# Patient Record
Sex: Male | Born: 1950 | Race: White | Hispanic: No | Marital: Single | Smoking: Never smoker
Health system: Southern US, Community
[De-identification: ages and names within clinical notes are randomized; demographics above are authoritative.]

## PROBLEM LIST (undated history)

## (undated) DIAGNOSIS — I2510 Atherosclerotic heart disease of native coronary artery without angina pectoris: Secondary | ICD-10-CM

## (undated) DIAGNOSIS — N390 Urinary tract infection, site not specified: Secondary | ICD-10-CM

## (undated) DIAGNOSIS — L0390 Cellulitis, unspecified: Secondary | ICD-10-CM

## (undated) DIAGNOSIS — I739 Peripheral vascular disease, unspecified: Secondary | ICD-10-CM

## (undated) DIAGNOSIS — K219 Gastro-esophageal reflux disease without esophagitis: Secondary | ICD-10-CM

## (undated) DIAGNOSIS — I509 Heart failure, unspecified: Secondary | ICD-10-CM

## (undated) DIAGNOSIS — E669 Obesity, unspecified: Secondary | ICD-10-CM

## (undated) DIAGNOSIS — D689 Coagulation defect, unspecified: Secondary | ICD-10-CM

## (undated) DIAGNOSIS — L8990 Pressure ulcer of unspecified site, unspecified stage: Secondary | ICD-10-CM

## (undated) DIAGNOSIS — I10 Essential (primary) hypertension: Secondary | ICD-10-CM

## (undated) DIAGNOSIS — J189 Pneumonia, unspecified organism: Secondary | ICD-10-CM

## (undated) DIAGNOSIS — R1311 Dysphagia, oral phase: Secondary | ICD-10-CM

## (undated) DIAGNOSIS — R319 Hematuria, unspecified: Principal | ICD-10-CM

## (undated) DIAGNOSIS — M6281 Muscle weakness (generalized): Secondary | ICD-10-CM

## (undated) DIAGNOSIS — L89159 Pressure ulcer of sacral region, unspecified stage: Secondary | ICD-10-CM

## (undated) DIAGNOSIS — G8220 Paraplegia, unspecified: Secondary | ICD-10-CM

## (undated) DIAGNOSIS — N319 Neuromuscular dysfunction of bladder, unspecified: Secondary | ICD-10-CM

## (undated) DIAGNOSIS — K589 Irritable bowel syndrome without diarrhea: Secondary | ICD-10-CM

## (undated) DIAGNOSIS — F419 Anxiety disorder, unspecified: Secondary | ICD-10-CM

## (undated) DIAGNOSIS — D649 Anemia, unspecified: Secondary | ICD-10-CM

## (undated) DIAGNOSIS — G5600 Carpal tunnel syndrome, unspecified upper limb: Secondary | ICD-10-CM

## (undated) DIAGNOSIS — G4733 Obstructive sleep apnea (adult) (pediatric): Secondary | ICD-10-CM

## (undated) DIAGNOSIS — R569 Unspecified convulsions: Secondary | ICD-10-CM

## (undated) HISTORY — DX: Urinary tract infection, site not specified: N39.0

## (undated) HISTORY — DX: Pressure ulcer of sacral region, unspecified stage: L89.159

## (undated) HISTORY — PX: COLON SURGERY: SHX602

## (undated) HISTORY — DX: Irritable bowel syndrome without diarrhea: K58.9

## (undated) HISTORY — DX: Hematuria, unspecified: R31.9

## (undated) HISTORY — DX: Coagulation defect, unspecified: D68.9

## (undated) HISTORY — DX: Cellulitis, unspecified: L03.90

## (undated) HISTORY — DX: Carpal tunnel syndrome, unspecified upper limb: G56.00

## (undated) HISTORY — DX: Muscle weakness (generalized): M62.81

## (undated) HISTORY — DX: Obesity, unspecified: E66.9

## (undated) HISTORY — PX: OTHER SURGICAL HISTORY: SHX169

## (undated) HISTORY — DX: Anemia, unspecified: D64.9

## (undated) HISTORY — DX: Neuromuscular dysfunction of bladder, unspecified: N31.9

## (undated) HISTORY — DX: Anxiety disorder, unspecified: F41.9

## (undated) HISTORY — DX: Dysphagia, oral phase: R13.11

## (undated) HISTORY — DX: Essential (primary) hypertension: I10

## (undated) HISTORY — DX: Pneumonia, unspecified organism: J18.9

---

## 2005-08-03 ENCOUNTER — Inpatient Hospital Stay (HOSPITAL_COMMUNITY): Admission: EM | Admit: 2005-08-03 | Discharge: 2005-08-07 | Payer: Self-pay | Admitting: Emergency Medicine

## 2005-08-05 ENCOUNTER — Encounter (INDEPENDENT_AMBULATORY_CARE_PROVIDER_SITE_OTHER): Payer: Self-pay | Admitting: *Deleted

## 2006-01-14 ENCOUNTER — Ambulatory Visit (HOSPITAL_COMMUNITY): Admission: RE | Admit: 2006-01-14 | Discharge: 2006-01-14 | Payer: Self-pay | Admitting: Internal Medicine

## 2006-01-20 ENCOUNTER — Ambulatory Visit (HOSPITAL_COMMUNITY): Admission: RE | Admit: 2006-01-20 | Discharge: 2006-01-20 | Payer: Self-pay | Admitting: Internal Medicine

## 2006-05-07 ENCOUNTER — Inpatient Hospital Stay (HOSPITAL_COMMUNITY): Admission: EM | Admit: 2006-05-07 | Discharge: 2006-05-24 | Payer: Self-pay | Admitting: Emergency Medicine

## 2006-05-11 ENCOUNTER — Ambulatory Visit: Payer: Self-pay | Admitting: Internal Medicine

## 2007-06-17 ENCOUNTER — Emergency Department (HOSPITAL_COMMUNITY): Admission: EM | Admit: 2007-06-17 | Discharge: 2007-06-17 | Payer: Self-pay | Admitting: Emergency Medicine

## 2007-07-30 ENCOUNTER — Inpatient Hospital Stay (HOSPITAL_COMMUNITY): Admission: EM | Admit: 2007-07-30 | Discharge: 2007-08-05 | Payer: Self-pay | Admitting: Emergency Medicine

## 2007-07-30 ENCOUNTER — Ambulatory Visit: Payer: Self-pay | Admitting: Pulmonary Disease

## 2008-01-30 ENCOUNTER — Ambulatory Visit (HOSPITAL_COMMUNITY): Admission: RE | Admit: 2008-01-30 | Discharge: 2008-01-30 | Payer: Self-pay | Admitting: Internal Medicine

## 2008-10-08 ENCOUNTER — Inpatient Hospital Stay (HOSPITAL_COMMUNITY): Admission: EM | Admit: 2008-10-08 | Discharge: 2008-10-11 | Payer: Self-pay | Admitting: Emergency Medicine

## 2008-10-09 ENCOUNTER — Encounter (INDEPENDENT_AMBULATORY_CARE_PROVIDER_SITE_OTHER): Payer: Self-pay | Admitting: *Deleted

## 2008-10-10 ENCOUNTER — Ambulatory Visit: Payer: Self-pay | Admitting: Internal Medicine

## 2008-10-14 ENCOUNTER — Emergency Department (HOSPITAL_COMMUNITY): Admission: EM | Admit: 2008-10-14 | Discharge: 2008-10-14 | Payer: Self-pay | Admitting: Emergency Medicine

## 2009-05-21 ENCOUNTER — Ambulatory Visit (HOSPITAL_COMMUNITY): Admission: RE | Admit: 2009-05-21 | Discharge: 2009-05-21 | Payer: Self-pay | Admitting: Gastroenterology

## 2009-05-21 ENCOUNTER — Encounter (INDEPENDENT_AMBULATORY_CARE_PROVIDER_SITE_OTHER): Payer: Self-pay | Admitting: Gastroenterology

## 2009-05-27 ENCOUNTER — Ambulatory Visit (HOSPITAL_COMMUNITY): Admission: RE | Admit: 2009-05-27 | Discharge: 2009-05-27 | Payer: Self-pay | Admitting: Oral Surgery

## 2010-05-03 ENCOUNTER — Inpatient Hospital Stay (HOSPITAL_COMMUNITY): Admission: EM | Admit: 2010-05-03 | Discharge: 2010-05-10 | Payer: Self-pay | Admitting: Emergency Medicine

## 2010-05-17 ENCOUNTER — Emergency Department (HOSPITAL_COMMUNITY): Admission: EM | Admit: 2010-05-17 | Discharge: 2010-05-17 | Payer: Self-pay | Admitting: Emergency Medicine

## 2010-08-04 ENCOUNTER — Emergency Department (HOSPITAL_COMMUNITY)
Admission: EM | Admit: 2010-08-04 | Discharge: 2010-08-04 | Payer: Self-pay | Source: Home / Self Care | Admitting: Emergency Medicine

## 2010-08-29 ENCOUNTER — Encounter: Payer: Self-pay | Admitting: *Deleted

## 2010-08-30 ENCOUNTER — Encounter (HOSPITAL_BASED_OUTPATIENT_CLINIC_OR_DEPARTMENT_OTHER): Payer: Self-pay | Admitting: Internal Medicine

## 2010-10-22 LAB — COMPREHENSIVE METABOLIC PANEL
ALT: 106 U/L — ABNORMAL HIGH (ref 0–53)
ALT: 33 U/L (ref 0–53)
ALT: 60 U/L — ABNORMAL HIGH (ref 0–53)
ALT: 93 U/L — ABNORMAL HIGH (ref 0–53)
AST: 45 U/L — ABNORMAL HIGH (ref 0–37)
AST: 70 U/L — ABNORMAL HIGH (ref 0–37)
Albumin: 2 g/dL — ABNORMAL LOW (ref 3.5–5.2)
Albumin: 2.2 g/dL — ABNORMAL LOW (ref 3.5–5.2)
Alkaline Phosphatase: 222 U/L — ABNORMAL HIGH (ref 39–117)
BUN: 10 mg/dL (ref 6–23)
BUN: 4 mg/dL — ABNORMAL LOW (ref 6–23)
BUN: 5 mg/dL — ABNORMAL LOW (ref 6–23)
BUN: 6 mg/dL (ref 6–23)
BUN: 8 mg/dL (ref 6–23)
CO2: 32 mEq/L (ref 19–32)
CO2: 34 mEq/L — ABNORMAL HIGH (ref 19–32)
CO2: 38 mEq/L — ABNORMAL HIGH (ref 19–32)
Calcium: 8.4 mg/dL (ref 8.4–10.5)
Calcium: 8.4 mg/dL (ref 8.4–10.5)
Calcium: 8.5 mg/dL (ref 8.4–10.5)
Calcium: 8.5 mg/dL (ref 8.4–10.5)
Calcium: 8.7 mg/dL (ref 8.4–10.5)
Chloride: 101 mEq/L (ref 96–112)
Chloride: 98 mEq/L (ref 96–112)
Chloride: 99 mEq/L (ref 96–112)
Creatinine, Ser: 0.68 mg/dL (ref 0.4–1.5)
Creatinine, Ser: 0.69 mg/dL (ref 0.4–1.5)
Creatinine, Ser: 0.72 mg/dL (ref 0.4–1.5)
Creatinine, Ser: 0.75 mg/dL (ref 0.4–1.5)
Creatinine, Ser: 0.76 mg/dL (ref 0.4–1.5)
GFR calc Af Amer: >60 mL/min (ref >60)
GFR calc Af Amer: >60 mL/min (ref >60)
GFR calc non Af Amer: >60 mL/min (ref >60)
GFR calc non Af Amer: >60 mL/min (ref >60)
GFR calc non Af Amer: >60 mL/min (ref >60)
GFR calc non Af Amer: >60 mL/min (ref >60)
Glucose, Bld: 134 mg/dL — ABNORMAL HIGH (ref 70–99)
Glucose, Bld: 94 mg/dL (ref 70–99)
Glucose, Bld: 95 mg/dL (ref 70–99)
Potassium: 4 mEq/L (ref 3.5–5.1)
Sodium: 139 mEq/L (ref 135–145)
Sodium: 141 mEq/L (ref 135–145)
Sodium: 142 mEq/L (ref 135–145)
Sodium: 143 mEq/L (ref 135–145)
Total Bilirubin: 0.3 mg/dL (ref 0.3–1.2)
Total Bilirubin: 0.5 mg/dL (ref 0.3–1.2)
Total Bilirubin: 0.5 mg/dL (ref 0.3–1.2)
Total Bilirubin: 0.5 mg/dL (ref 0.3–1.2)
Total Protein: 7.3 g/dL (ref 6.0–8.3)
Total Protein: 7.4 g/dL (ref 6.0–8.3)
Total Protein: 7.6 g/dL (ref 6.0–8.3)

## 2010-10-22 LAB — URINALYSIS, ROUTINE W REFLEX MICROSCOPIC
Bilirubin Urine: NEGATIVE
Glucose, UA: NEGATIVE mg/dL
Ketones, ur: 15 mg/dL — AB
Nitrite: NEGATIVE
Protein, ur: 100 mg/dL — AB
Specific Gravity, Urine: 1.023 (ref 1.005–1.030)
Urobilinogen, UA: 1 mg/dL (ref 0.0–1.0)
Urobilinogen, UA: 1 mg/dL (ref 0.0–1.0)
pH: 8 (ref 5.0–8.0)

## 2010-10-22 LAB — URINE CULTURE
Colony Count: >=100000
Culture  Setup Time: 201109251844

## 2010-10-22 LAB — CBC
HCT: 27.1 % — ABNORMAL LOW (ref 39.0–52.0)
HCT: 28.2 % — ABNORMAL LOW (ref 39.0–52.0)
HCT: 28.4 % — ABNORMAL LOW (ref 39.0–52.0)
HCT: 28.4 % — ABNORMAL LOW (ref 39.0–52.0)
Hemoglobin: 7.5 g/dL — ABNORMAL LOW (ref 13.0–17.0)
Hemoglobin: 7.8 g/dL — ABNORMAL LOW (ref 13.0–17.0)
Hemoglobin: 8.1 g/dL — ABNORMAL LOW (ref 13.0–17.0)
Hemoglobin: 8.7 g/dL — ABNORMAL LOW (ref 13.0–17.0)
MCH: 22.7 pg — ABNORMAL LOW (ref 26.0–34.0)
MCH: 23 pg — ABNORMAL LOW (ref 26.0–34.0)
MCH: 23.2 pg — ABNORMAL LOW (ref 26.0–34.0)
MCH: 23.3 pg — ABNORMAL LOW (ref 26.0–34.0)
MCH: 23.4 pg — ABNORMAL LOW (ref 26.0–34.0)
MCH: 23.4 pg — ABNORMAL LOW (ref 26.0–34.0)
MCHC: 28.5 g/dL — ABNORMAL LOW (ref 30.0–36.0)
MCHC: 28.9 g/dL — ABNORMAL LOW (ref 30.0–36.0)
MCHC: 28.9 g/dL — ABNORMAL LOW (ref 30.0–36.0)
MCHC: 29.6 g/dL — ABNORMAL LOW (ref 30.0–36.0)
MCHC: 30.3 g/dL (ref 30.0–36.0)
MCV: 75.8 fL — ABNORMAL LOW (ref 78.0–100.0)
MCV: 75.9 fL — ABNORMAL LOW (ref 78.0–100.0)
MCV: 78.1 fL (ref 78.0–100.0)
MCV: 78.2 fL (ref 78.0–100.0)
MCV: 78.5 fL (ref 78.0–100.0)
Platelets: 258 10*3/uL (ref 150–400)
Platelets: 405 10*3/uL — ABNORMAL HIGH (ref 150–400)
Platelets: 533 10*3/uL — ABNORMAL HIGH (ref 150–400)
RBC: 3.3 MIL/uL — ABNORMAL LOW (ref 4.22–5.81)
RBC: 3.31 MIL/uL — ABNORMAL LOW (ref 4.22–5.81)
RBC: 3.45 MIL/uL — ABNORMAL LOW (ref 4.22–5.81)
RBC: 3.52 MIL/uL — ABNORMAL LOW (ref 4.22–5.81)
RBC: 3.53 MIL/uL — ABNORMAL LOW (ref 4.22–5.81)
RBC: 3.72 MIL/uL — ABNORMAL LOW (ref 4.22–5.81)
RDW: 17.7 % — ABNORMAL HIGH (ref 11.5–15.5)
RDW: 17.9 % — ABNORMAL HIGH (ref 11.5–15.5)
RDW: 18 % — ABNORMAL HIGH (ref 11.5–15.5)
WBC: 12.6 10*3/uL — ABNORMAL HIGH (ref 4.0–10.5)
WBC: 21.1 10*3/uL — ABNORMAL HIGH (ref 4.0–10.5)

## 2010-10-22 LAB — CULTURE, BLOOD (ROUTINE X 2)
Culture  Setup Time: 201109251849
Culture: NO GROWTH

## 2010-10-22 LAB — POCT CARDIAC MARKERS
CKMB, poc: <1 ng/mL — ABNORMAL LOW (ref 1.0–8.0)
Myoglobin, poc: 223 ng/mL (ref 12–200)
Troponin i, poc: <0.05 ng/mL (ref 0.00–0.09)

## 2010-10-22 LAB — CROSSMATCH
ABO/RH(D): A NEG
DAT, IgG: NEGATIVE

## 2010-10-22 LAB — DIFFERENTIAL
Basophils Relative: 0 % (ref 0–1)
Eosinophils Absolute: 0 10*3/uL (ref 0.0–0.7)
Eosinophils Absolute: 0.1 10*3/uL (ref 0.0–0.7)
Eosinophils Relative: 0 % (ref 0–5)
Eosinophils Relative: 1 % (ref 0–5)
Lymphocytes Relative: 6 % — ABNORMAL LOW (ref 12–46)
Lymphs Abs: 1.1 10*3/uL (ref 0.7–4.0)
Lymphs Abs: 1.2 10*3/uL (ref 0.7–4.0)
Monocytes Relative: 7 % (ref 3–12)
Monocytes Relative: 7 % (ref 3–12)

## 2010-10-22 LAB — URINE MICROSCOPIC-ADD ON

## 2010-10-22 LAB — VANCOMYCIN, TROUGH: Vancomycin Tr: 23.2 ug/mL — ABNORMAL HIGH (ref 10.0–20.0)

## 2010-10-22 LAB — IRON AND TIBC
Saturation Ratios: 13 % — ABNORMAL LOW (ref 20–55)
TIBC: 168 ug/dL — ABNORMAL LOW (ref 215–435)

## 2010-10-22 LAB — BASIC METABOLIC PANEL
CO2: 30 mEq/L (ref 19–32)
CO2: 33 mEq/L — ABNORMAL HIGH (ref 19–32)
Calcium: 8.2 mg/dL — ABNORMAL LOW (ref 8.4–10.5)
Chloride: 95 mEq/L — ABNORMAL LOW (ref 96–112)
Chloride: 95 mEq/L — ABNORMAL LOW (ref 96–112)
Creatinine, Ser: 0.89 mg/dL (ref 0.4–1.5)
GFR calc Af Amer: >60 mL/min (ref >60)
GFR calc Af Amer: >60 mL/min (ref >60)
Glucose, Bld: 116 mg/dL — ABNORMAL HIGH (ref 70–99)
Potassium: 3.8 mEq/L (ref 3.5–5.1)
Sodium: 133 mEq/L — ABNORMAL LOW (ref 135–145)

## 2010-10-22 LAB — FOLATE: Folate: >20 ng/mL

## 2010-10-22 LAB — VALPROIC ACID LEVEL: Valproic Acid Lvl: 11.8 ug/mL — ABNORMAL LOW (ref 50.0–100.0)

## 2010-11-12 LAB — CBC
Platelets: 263 10*3/uL (ref 150–400)
RBC: 4.62 MIL/uL (ref 4.22–5.81)
WBC: 6.9 10*3/uL (ref 4.0–10.5)

## 2010-11-12 LAB — GLUCOSE, CAPILLARY: Glucose-Capillary: 97 mg/dL (ref 70–99)

## 2010-11-12 LAB — BASIC METABOLIC PANEL
Calcium: 9.7 mg/dL (ref 8.4–10.5)
Creatinine, Ser: 0.62 mg/dL (ref 0.4–1.5)
GFR calc Af Amer: >60 mL/min (ref >60)
GFR calc non Af Amer: >60 mL/min (ref >60)

## 2010-11-19 LAB — BASIC METABOLIC PANEL
BUN: 103 mg/dL — ABNORMAL HIGH (ref 6–23)
BUN: 58 mg/dL — ABNORMAL HIGH (ref 6–23)
CO2: 20 mEq/L (ref 19–32)
CO2: 22 mEq/L (ref 19–32)
Calcium: 8.4 mg/dL (ref 8.4–10.5)
Calcium: 8.4 mg/dL (ref 8.4–10.5)
Chloride: 103 mEq/L (ref 96–112)
Creatinine, Ser: 1.97 mg/dL — ABNORMAL HIGH (ref 0.4–1.5)
GFR calc Af Amer: 42 mL/min — ABNORMAL LOW (ref >60)
GFR calc non Af Amer: 35 mL/min — ABNORMAL LOW (ref >60)
GFR calc non Af Amer: >60 mL/min (ref >60)
Glucose, Bld: 111 mg/dL — ABNORMAL HIGH (ref 70–99)
Glucose, Bld: 97 mg/dL (ref 70–99)
Potassium: 4.5 mEq/L (ref 3.5–5.1)
Potassium: 4.6 mEq/L (ref 3.5–5.1)
Sodium: 131 mEq/L — ABNORMAL LOW (ref 135–145)

## 2010-11-19 LAB — POCT I-STAT, CHEM 8
Creatinine, Ser: 3.2 mg/dL — ABNORMAL HIGH (ref 0.4–1.5)
Glucose, Bld: 112 mg/dL — ABNORMAL HIGH (ref 70–99)
HCT: 35 % — ABNORMAL LOW (ref 39.0–52.0)
Hemoglobin: 11.9 g/dL — ABNORMAL LOW (ref 13.0–17.0)
Potassium: 4.8 mEq/L (ref 3.5–5.1)
TCO2: 20 mmol/L (ref 0–100)

## 2010-11-19 LAB — BLOOD GAS, ARTERIAL
Acid-base deficit: 7.3 mmol/L — ABNORMAL HIGH (ref 0.0–2.0)
Bicarbonate: 19.3 mEq/L — ABNORMAL LOW (ref 20.0–24.0)
Drawn by: 232811
FIO2: 0.21 %
O2 Saturation: 91.4 %
Patient temperature: 98.6
TCO2: 18.5 mmol/L (ref 0–100)
pCO2 arterial: 46.3 mmHg — ABNORMAL HIGH (ref 35.0–45.0)
pH, Arterial: 7.244 — ABNORMAL LOW (ref 7.350–7.450)
pO2, Arterial: 67.8 mmHg — ABNORMAL LOW (ref 80.0–100.0)

## 2010-11-19 LAB — CBC
HCT: 30.7 % — ABNORMAL LOW (ref 39.0–52.0)
HCT: 33.4 % — ABNORMAL LOW (ref 39.0–52.0)
HCT: 34.4 % — ABNORMAL LOW (ref 39.0–52.0)
Hemoglobin: 10 g/dL — ABNORMAL LOW (ref 13.0–17.0)
Hemoglobin: 10.7 g/dL — ABNORMAL LOW (ref 13.0–17.0)
MCHC: 32 g/dL (ref 30.0–36.0)
MCHC: 32.7 g/dL (ref 30.0–36.0)
MCV: 77.3 fL — ABNORMAL LOW (ref 78.0–100.0)
MCV: 78.1 fL (ref 78.0–100.0)
MCV: 78.7 fL (ref 78.0–100.0)
Platelets: 206 10*3/uL (ref 150–400)
Platelets: 216 10*3/uL (ref 150–400)
Platelets: 288 10*3/uL (ref 150–400)
RBC: 4.24 MIL/uL (ref 4.22–5.81)
RBC: 4.24 MIL/uL (ref 4.22–5.81)
RBC: 4.45 MIL/uL (ref 4.22–5.81)
RDW: 17.9 % — ABNORMAL HIGH (ref 11.5–15.5)
RDW: 18.2 % — ABNORMAL HIGH (ref 11.5–15.5)
WBC: 6.6 10*3/uL (ref 4.0–10.5)
WBC: 7.4 10*3/uL (ref 4.0–10.5)
WBC: 8.3 10*3/uL (ref 4.0–10.5)

## 2010-11-19 LAB — COMPREHENSIVE METABOLIC PANEL
ALT: 17 U/L (ref 0–53)
AST: 15 U/L (ref 0–37)
Albumin: 3.5 g/dL (ref 3.5–5.2)
Alkaline Phosphatase: 54 U/L (ref 39–117)
CO2: 31 mEq/L (ref 19–32)
Chloride: 100 mEq/L (ref 96–112)
Creatinine, Ser: 0.63 mg/dL (ref 0.4–1.5)
GFR calc Af Amer: >60 mL/min (ref >60)
GFR calc non Af Amer: >60 mL/min (ref >60)
Potassium: 4.7 mEq/L (ref 3.5–5.1)
Sodium: 138 mEq/L (ref 135–145)
Total Bilirubin: 0.7 mg/dL (ref 0.3–1.2)

## 2010-11-19 LAB — URINALYSIS, ROUTINE W REFLEX MICROSCOPIC
Bilirubin Urine: NEGATIVE
Bilirubin Urine: NEGATIVE
Glucose, UA: NEGATIVE mg/dL
Ketones, ur: NEGATIVE mg/dL
Ketones, ur: NEGATIVE mg/dL
Nitrite: POSITIVE — AB
Nitrite: POSITIVE — AB
Specific Gravity, Urine: 1.015 (ref 1.005–1.030)
Specific Gravity, Urine: 1.022 (ref 1.005–1.030)
Urobilinogen, UA: 0.2 mg/dL (ref 0.0–1.0)
pH: 8 (ref 5.0–8.0)

## 2010-11-19 LAB — LIPASE, BLOOD: Lipase: 41 U/L (ref 11–59)

## 2010-11-19 LAB — URINE MICROSCOPIC-ADD ON

## 2010-11-19 LAB — CLOSTRIDIUM DIFFICILE EIA
C difficile Toxins A+B, EIA: NEGATIVE
C difficile Toxins A+B, EIA: NEGATIVE

## 2010-11-19 LAB — CREATININE, URINE, RANDOM: Creatinine, Urine: 132.1 mg/dL

## 2010-11-19 LAB — URINE CULTURE
Colony Count: >=100000
Special Requests: NEGATIVE

## 2010-11-19 LAB — DIFFERENTIAL
Basophils Absolute: 0.3 10*3/uL — ABNORMAL HIGH (ref 0.0–0.1)
Basophils Relative: 1 % (ref 0–1)
Basophils Relative: 4 % — ABNORMAL HIGH (ref 0–1)
Eosinophils Absolute: 0.3 10*3/uL (ref 0.0–0.7)
Eosinophils Relative: 2 % (ref 0–5)
Eosinophils Relative: 4 % (ref 0–5)
Lymphocytes Relative: 15 % (ref 12–46)
Lymphs Abs: 1.1 10*3/uL (ref 0.7–4.0)
Monocytes Absolute: 0.5 10*3/uL (ref 0.1–1.0)
Monocytes Absolute: 0.7 10*3/uL (ref 0.1–1.0)
Monocytes Relative: 10 % (ref 3–12)

## 2010-11-19 LAB — NA AND K (SODIUM & POTASSIUM), RAND UR
Potassium Urine: 24 mEq/L
Sodium, Ur: 39 mEq/L

## 2010-11-19 LAB — ANTISTREPTOLYSIN O TITER: ASO: 153 IU/mL — ABNORMAL HIGH (ref 0–116)

## 2010-11-19 LAB — PROTEIN, URINE, RANDOM: Total Protein, Urine: 67 mg/dL

## 2010-11-19 LAB — HEMOCCULT GUIAC POC 1CARD (OFFICE): Fecal Occult Bld: POSITIVE

## 2010-12-22 NOTE — Consult Note (Signed)
NAMEJERARDO, COSTABILE              ACCOUNT NO.:  [id number]   MEDICAL RECORD NO.:  [id number]          PATIENT TYPE:  INP   LOCATION:  1341                         FACILITY:  [facility]   PHYSICIAN:  Sigmund I. Patsi Sears, M.D.DATE OF BIRTH:  July 26, 1951   DATE OF CONSULTATION:  10/09/2008  DATE OF DISCHARGE:                                 CONSULTATION   CHIEF COMPLAINT:  SP tube is leaking.  Request SP tube change.   HISTORY OF PRESENT ILLNESS:  Acute renal failure.  [name] is a 60-  year-old white male with a history of paraplegia secondary to an  accident in March of 1974 who presents from [facility] skilled nursing  facility after findings of acute renal failure.  The patient was  admitted, and today [facility] has asked [place] to change his SP tube due to  leakage.  The patient has a long history of SP tube placement.  He was  last seen in our office by Dr. Boston Service in 2008.  At that time,  the SP tube was changed in our office, but at this point, the SP tube is  chronically changed at the skilled nursing facility in which he lives.   REVIEW OF SYSTEMS:  Noncontributory.   PAST MEDICAL HISTORY:  1. T7-T8 paraplegia.  2. Chronic pain syndrome.  3. Neurogenic bladder.  4. Recurrent urinary tract infections.  5. Decubitus ulcers.  6. Seizure disorder.  7. Anxiety, depression.  8. History of E-coli septicemia with septic shock in 2008.  9. History of normocytic anemia.  10.History of MRSA.  11.History of sacral decubitus ulcer treated by plastic surgery at      [facility].   ALLERGIES:  1. CIPRO.  2. MINOCYCLINE.   MEDICATIONS ON ADMISSION:  1. Zoloft 150 mg p.o. daily.  2. Vitamin C 500 mg b.i.d.  3. Baclofen 10 mg b.i.d.  4. Keppra 1000 mg b.i.d.  5. Depakote 250 q.h.s.  6. Ambien 10 mg q.h.s.  7. Visine tears p.r.n.  8. Maalox p.r.n.  9. Norco 10/325 p.r.n.  10.Ditropan 5 mg p.o. b.i.d.  11.Flagyl 500 mg p.o. t.i.d. x10 days.  That was started  on October 05, 2008.   FAMILY HISTORY:  Mother is deceased at age 35 with an MI.  Father  deceased at age 68 with an MI.   SOCIAL HISTORY:  The patient currently lives at [facility] nursing  facility.  Denies tobacco, alcohol or drug use.   PHYSICAL EXAMINATION:  VITAL SIGNS TODAY:  Temperature 97.3, pulse 72,  respirations 20, blood pressure 113/53, room air saturation is 98% on 2  liters.   LABORATORY DATA:  Clostridium difficile is negative.  BMET is sodium  131, potassium 4.5, chloride 103, CO2 is 20, glucose 111, BUN is 103 and  creatinine is 1.97.  WBC is 7.4, hemoglobin 10.7, hematocrit 33.4 and  platelets of 216.  CT abdomen and pelvis without contrast shows no renal  pathology evident on the right, possible swelling of the inferior aspect  of the left kidney and prominence of the left renal collecting system  and ureters.  No obvious stone is seen.  It could possibly be related to  sequelae of infection, enlarged xiphoid region mass, the possibility of  a cartilaginous tumor, either benign or malignant, and pelvis shows left  ureters mildly dilated to the bladder.  Suprapubic bladder catheter  remains in place.  Possibly there could be some relative obstruction of  the left ureter because of chronic bladder thickening and inflammation.  Chronic bony changes consistent with previous infection, multiple  decubiti ulcers.  No sign of drainageable abscess in the pelvis.  CT in  2009 of the pelvis shows a suprapubic catheter is in place in the  bladder.  No free fluid, no abscess or lesion.   PHYSICAL EXAMINATION:  GENERAL:  A well-developed, well-nourished white  male in no acute distress.  HEENT:  Normocephalic, atraumatic.  NECK:  Supple, nontender, no masses.  HEART:  Regular rhythm and rate.  LUNGS:  Clear to percussion and auscultation.  ABDOMEN:  Soft, rotund.  Positive bowel sounds x4.  He has a colostomy  in the left lower quadrant with a patent stoma.  SP tube  is in midline  suprapubic.  It is well healed, unremarkable.  There is some urinary  leakage around the site, and he is draining clear yellow urine.  SKIN:  The skin to abdomen is unremarkable.  EXTREMITIES:  Bilateral foot contractures, and feet are approximated on  pillows with +2 pedal pulses.  PSYCHOLOGIC:  Pleasant and cooperative.   ASSESSMENT:  Chronic SP tube related to neurogenic bladder.  Catheter  was changed.  A #22-French catheter was removed without difficulty and  another 22-French catheter attached to bedside drainage bag was inserted  without difficulty.  The patient tolerated the procedure well.  Will  recommend the patient be restarted on his oxybutynin 5 mg one p.o.  t.i.d. for leakage and needs to have SP tube changed again in 30 days at  the skilled nursing facility.   At this time, urology will sign off.  If our further assistance is  needed in the future, please do not hesitate to call.      Jetta Lout, NP      Sigmund I. Patsi Sears, M.D.  Electronically Signed    DW/MEDQ  D:  10/09/2008  T:  10/09/2008  Job:  [id number]

## 2010-12-22 NOTE — Discharge Summary (Signed)
NAMEMARQUAVIOUS, [name]              ACCOUNT NO.:  [id number]   MEDICAL RECORD NO.:  [id number]          PATIENT TYPE:  INP   LOCATION:  1341                         FACILITY:  [facility]   PHYSICIAN:  Monte Fantasia, MD  DATE OF BIRTH:  1950-08-23   DATE OF ADMISSION:  10/08/2008  DATE OF DISCHARGE:                               DISCHARGE SUMMARY   DISCHARGE DIAGNOSES:  1. Urinary tract infection.  2. Acute renal failure.  3. Clostridium difficile infection with Hemoccult blood in stools.  4. Anemia.  5. Chronic pain.  6. Seizure disorder.  7. Neurogenic bladder.  8. History of equalized septicemia shock.  9. History of normocytic anemia.  10.History of methicillin-resistant Staphylococcus aureus wound      infections.  11.History of sacral decubitus ulcers.   DISCHARGE MEDICATIONS:  1. Ascorbic acid 500 mg p.o. b.i.d.  2. Aspirin 81 mg p.o. daily.  3. Baclofen 10 mg p.o. b.i.d.  4. Levaquin 500 mg p.o. daily for 7 days.  5. Depakote 250 mg p.o. nightly.  6. Keppra 1000 mg p.o. b.i.d.  7. Metronidazole 500 mg p.o. t.i.d. for 8 days.  8. Oxybutynin 5 mg p.o. t.i.d.  9. Prevacid 20 mg p.o. daily.  10.Zoloft 150 mg p.o. daily.  11.Ambien 10 mg p.o. nightly p.r.n. insomnia.  12.Visine teardrops as needed.  13.Norco 10/325 mg 1 tablet q.4 h p.r.n. pain.   COURSE DURING THE HOSPITAL STAY:  [name] is a 60 year old  gentleman, with a history of paraplegia secondary to an accident in  March 1974, sent in from [facility] skilled nursing facility with  findings of acute renal failure.  The patient had a creatinine of 3.9  along with sodium of 131 and potassium of 5.7 on admission.  The patient  was given aggressive IV fluid hydration for the same.  The patient also  had a CT of the abdomen and pelvis without contrast which demonstrated  no evidence of renal stone and possible swelling of the anterior aspect  of the left kidney with prominence of left renal collecting system  and  ureter.  The patient had leaking of his suprapubic catheter at the time  of admission.  Hence, the patient was evaluated with Urology and had a  suprapubic catheter changed and was started on the oxybutynin.  The  suprapubic catheter leak was controlled well.  In view of his renal  insufficiency with IV fluid hydration, the patient responded well with  his hydration and creatinine at present has come down to 0.9 from 3.2.   The patient was also evaluated by GI in view of Hemoccult blood positive  in the colostomy bag, from the stools from the colostomy bag.  As per  the GI recommendations, it is not unusual to have guaiac-positive stools  from the colostomy due to the stomal irritation from changing of that  appliance.  They recommended to treat the patient for C. diff and can  have an EGD colon as an outpatient for the same.  Patient was started on  metronidazole for the C. diff prior to admission from [place] on  February 27th,  which was continued through the stay in the hospital.  It  is recommended to complete a course of total of 14 days for control of  the C. diff infection and later can be discontinued, to complete a  course of 14 days.  The patient at present is medically stable to be  discharged and can be discharged to the skilled nursing facility.   RADIOLOGICAL INVESTIGATIONS DONE DURING THE STAY IN THE HOSPITAL:  A CT  scan of the abdomen and pelvis done without contrast on October 08, 2008.  Impression:  No renal pathology evident on the right, possible swelling  of the inferior aspect of the left kidney and prominence of the left  renal collecting system and the ureter.  I do not see a stone, could be  possible sequelae of an infection.  CT of the pelvis.  Impression:  Left  ureter mildly dilated to the bladder.  Suprapubic catheter remains in  place.  Could be possible secondary to obstruction of the left ureter  because of chronic bladder thickening and  inflammation.   LABS DONE DURING THE STAY IN THE HOSPITAL:  Total WBC 5.2, hemoglobin  10.0, hematocrit 30.7, platelets of 206, sodium 139, potassium 4.6,  chloride 111, bicarbonate 22, glucose 97.  BUN 58, creatinine 0.92,  calcium of 8.4.  UA was positive with leukocytes large, nitrate positive  and WBC 21-50.  Urine cultures more than 100,000 colonies of multiple  bacterial morphocytes, none predominant.  Occult blood test x2 are  positive.  C. diff  times 2 sets are negative and 1 is pending.   DISPOSITION:  Patient is stable to be discharged to the skilled nursing  facility.  The patient is recommended to complete a course of  antibiotics, Levaquin for the UTI for a total of 10 days and to follow  the C. diff colitis for a total of 14 days.  I recommended to follow up  with the physician at the nursing care facility.      Monte Fantasia, MD  Electronically Signed     MP/MEDQ  D:  10/11/2008  T:  10/11/2008  Job:  [id number]

## 2010-12-22 NOTE — Discharge Summary (Signed)
[name], [name]              ACCOUNT NO.:  [id number]   MEDICAL RECORD NO.:  [id number]          PATIENT TYPE:  INP   LOCATION:  6715                         FACILITY:  [facility]   PHYSICIAN:  Hillery Aldo, M.D.   DATE OF BIRTH:  07-18-1951   DATE OF ADMISSION:  07/30/2007  DATE OF DISCHARGE:  08/05/2007                               DISCHARGE SUMMARY   DISPOSITION:  Discharged on August 05, 2007, pending bed availability  at [place].   PRIMARY CARE PHYSICIAN:  Dr. Baltazar Najjar with [facility].   DISCHARGE DIAGNOSES:  1. Escherichia coli septicemia with septic shock.  2. Normocytic anemia, status post 2 units of packed red blood cells.  3. Methicillin-resistant Staphylococcus aureus wound infections.  4. Paraplegia.  5. Sacral decubitus ulcer, stage II.  6. Left foot fifth toe ulcer, stage I.  7. Right lower extremity posterior aspect pressure ulcer, stage II.  8. Seizure disorder.  9. Chronic pain.  10.Neurogenic bladder.  11.Anxiety.  12.Depression.   DISCHARGE MEDICATIONS:  1. Keppra 1000 mg p.o. b.i.d.  2. Depakote 250 mg p.o. b.i.d.  3. Multivitamin daily.  4. Omeprazole 20 mg p.o. daily.  5. Aspirin 81 mg p.o. daily.  6. Claritin 10 mg daily.  7. Zoloft 150 mg p.o. daily.  8. Vitamin C 500 mg p.o. b.i.d.  9. Baclofen 10 mg p.o. b.i.d.  10.Rocephin 2 g IV daily through August 12, 2007.  11.Vancomycin 1250 mg IV q.12 h. through August 07, 2007.  12.Seroquel 75 mg nightly.  13.Patanol eye drops 1 drop in each eye b.i.d. p.r.n.  14.Zinc sulfate 220 mg b.i.d.  15.Artificial feels tears 1 drop in each eye q.4 h. p.r.n.   CONSULTATIONS:  Wound care RN.   HISTORY OF PRESENT ILLNESS:  The patient is a 60 year old, paraplegic  male who presented to the emergency department with complaints of severe  abdominal pain and fever.  He was admitted for further evaluation and  workup.  For the full details, please see dictated H&P done by Dr.   Lovell Sheehan.   PROCEDURES/DIAGNOSTIC STUDIES:  1. Chest x-ray on July 30, 2007, showed mild left base      atelectasis.  2. Chest x-ray on July 30, 2007, status post placement of right      internal jugular catheter and showed the catheter in good position      with no other changes.  3. Chest x-ray on July 31, 2007, showed no marked change with      pulmonary vascular congestion.   DISCHARGE LABORATORY VALUES:  Sodium was 142, potassium 3.9, chloride  105, bicarb 31, BUN 5, creatinine 0.68, glucose 98.  White blood cell  count was 6.3, hemoglobin 9.2, hematocrit 27.8, platelets 181.   HOSPITAL COURSE:  Problem 1.  ESCHERICHIA COLI SEPTICEMIA WITH SEPTIC  SHOCK:  The patient was admitted for septic evaluation given his history  of recurrent urinary tract infection.  He was hypotensive and septic and  was transferred to the ICU under the service of the pulmonary critical  care team for the first several days of his hospitalization.  He was  critically ill with circulatory failure, renal failure, hematologic and  severe metabolic derangements due to overwhelming infection.  Blood  cultures subsequently were positive x2 for Escherichia coli.  The  patient did need to be put transiently on Levophed for his septic shock.  His blood pressures subsequently began to stabilize and he was off  pressors by July 31, 2007.  His clinical condition markedly improved  over the next 24 hours and he was able to be transferred to a medical  floor.  His antibiotic coverage was narrowed from [place], which he was  initially put on, to Rocephin once the sensitivities were confirmed on  the blood cultures.  He was also kept on vancomycin for concerns of MRSA  wound infection.  At this point, the patient is hemodynamically stable  and has completed 5 days of antibiotic therapy, concurrently ceftriaxone  and vancomycin.  He will complete a 2-week course of treatment with  ceftriaxone for his  bacteremia and 7 days of treatment with vancomycin  for his MRSA wound infections.  He is to have a peripherally inserted  central catheter placed and once this is done, he will be transferred to  his skilled nursing home at [place].   Problem 2.  NORMOCYTIC ANEMIA:  The patient's anemia is normocytic,  though bordering on the microcytic range.  He had an anemia panel done  which showed an iron of less than 10.  Total iron binding capacity could  not be calculated with normal B12 and folic acid studies.  His ferritin  was 61.  He was put on InFeD in the hospital and also given p.o. iron  supplements which were placed on hold due to abdominal discomfort.  He  subsequently underwent transfusion with 2 units of packed red blood  cells for a hemoglobin of 7.8.  The patient has not had a GI evaluation,  consideration can be made towards doing this once he is stable and off  antibiotic therapy.   Problem 3.  DECUBITUS ULCERS WITH METHICILLIN-RESISTANT STAPHYLOCOCCUS  AUREUS WOUND INFECTION/COLONIZATION:  The patient does have significant  skin breakdown and is at high risk for more breakdown due to his  paraplegia.  He was seen in consultation with the wound care RN.  He has  a large sacral ulcer measuring 10 x 3 x 2-cm with undermining of 1-cm  circumferentially.  He also had partial thickness wounds to his right  and left buttocks thought to be secondary to moisture measuring 3 x 2-  cm.  His right lower extremity posterior aspect had a stage II pressure  ulcer measuring 5 x 3-cm.  He had a left foot fifth toe ulcer measuring  1 x 1-cm, stage I.  We recommend ongoing pressure reduction with a  KinAir bed and trapeze bar.  He should continue to receive normal saline  wet-to-dry dressing changes to his sacral wound three times daily.  He  should have border to his right and left buttock if wounds increased.  He should have an OpSite on his right lower extremity and changed as  needed.  He  was put on vitamin C and zinc supplements to help with wound  healing.   Problem 4.  SEIZURE DISORDER:  The patient has remained seizure-free on  his usual medications.   Problem 5.  CHRONIC PAIN:  The patient was put on baclofen for help with  spasms.  He was given Dilaudid as needed for pain control.   DISPOSITION:  The patient will  be discharged to a skilled nursing  facility once a PICC line is placed.  He should receive a complete  course of IV antibiotics as outlined above.  He should follow up with  his primary care physician early next week.      Hillery Aldo, M.D.  Electronically Signed     CR/MEDQ  D:  08/05/2007  T:  08/05/2007  Job:  [id number]   cc:   Maxwell Caul, M.D.

## 2010-12-22 NOTE — H&P (Signed)
NAMEMANLEY, FASON NO.:  [id number]   MEDICAL RECORD NO.:  [id number]          PATIENT TYPE:  EMS   LOCATION:  ED                           FACILITY[facility]   PHYSICIAN:  Sarajane Marek, MD     DATE OF BIRTH:  Aug 10, 1950   DATE OF ADMISSION:  10/08/2008  DATE OF DISCHARGE:                              HISTORY & PHYSICAL   CHIEF COMPLAINT:  Renal failure.   HISTORY OF PRESENT ILLNESS:  [name] is a 60 year old gentleman with  a history of paraplegia secondary to an accident in March 1974 who  presents from the [facility] after findings of  acute renal failure.  Current laboratory data sent with the patient, he  had BUN 119 and creatinine 3.9 along with a sodium of 131 and potassium  5.7 documented at their facility from labs drawn on October 08, 2008 at 6  a.m.  He was, therefore, sent to the Emergency Department where he did  have abdominal and pelvic CT scan without contrast, which demonstrated  no evidence of renal stone but did demonstrate possible swelling of the  anterior aspect of the left kidney and prominence of the left renal  collecting system and ureter, which could be secondary to infection with  dilation of the left ureter to the level of the bladder.  The patient  reports he has felt well recently, though had what sounds like a viral  illness 2 weeks ago with some sore throat as well as nausea and  vomiting. This has since resolved.  He does endorse decreased p.o.  intake since the time of this illness.  Otherwise, he denies any fevers,  chills or sweats.  He has not had increased ostomy output nor change in  the appearance of ostomy output and specifically denies blood or mucus,  though I would note that the nursing home notes indicate that he was  Hemoccult positive x3 on October 04, 2008 with suspected C. diff  colitis and was initiated on Flagyl therapy to be completed on October 14, 2008.  The patient does endorse  bilateral flank pain, right greater than  left but, otherwise, has no complaints today.   REVIEW OF SYSTEMS:  A complete review of systems was negative except as  noted in the HPI.   PAST MEDICAL HISTORY:  1. T7-T8 paraplegia fall in March 1974.  2. Chronic pain syndrome.  3. Neurogenic bladder.  4. History of recurrent urinary tract infections.  5. Current decubitus ulcers.  6. Seizure disorder.  7. Anxiety and depression.  8. History of E. coli septicemia and septic shock in December 2008.  9. History of normocytic anemia.  10.History of MRSA wound infections.  11.History of sacral decubitus ulcer previously treated by plastic      surgery at [facility], which is now resolved.   ALLERGIES:  CIPRO AND MINOCYCLINE.   MEDICATIONS:  1. Zoloft 150 mg p.o. daily.  2. Vitamin C 500 mg p.o. b.i.d.  3. Baclofen 10 mg p.o. b.i.d.  4. Keppra 1000 mg p.o. b.i.d.  5. Depakote 250 mg  p.o. q.h.s.  6. Ambien 10 mg p.o. q.h.s.  7. Visine tears t.i.d. as needed.  8. Maalox 30 mL p.o. q.4 h p.r.n.  9. Norco 10/325 one tab p.o. q.4 h p.r.n.  10.Ditropan 5 mg p.o. b.i.d.  11.Flagyl 500 mg p.o. t.i.d. x10 days started on 10/05/2008.   FAMILY HISTORY:  The patient reports that his mother is deceased at age  80 with an MI and his father is deceased at age 57 with MI.   SOCIAL HISTORY:  The patient currently resides at the [facility].  He denies history of tobacco use, alcohol or drug use.   PHYSICAL EXAMINATION:  VITAL SIGNS:  Blood pressure 122/61, heart rate  80, respiratory rate 18, pulse ox 96% on room air, temperature 97.7.  GENERAL:  This is a obese, Caucasian male in no acute distress.  HEENT:  Eyes:  Extraocular muscles intact.  Sclerae clear.  Oropharynx:  Mucous membranes are dry but oropharynx is otherwise, clear with no  erythema or exudates.  NECK:  Supple without lymphadenopathy.  There is no thyromegaly, bruit  or JVD.  CARDIOVASCULAR:  Heart  rate is regular, normal S1 and S2.  No S3 or S4.  No audible murmurs, rubs or gallop.  RESPIRATORY:  Lungs are clear bilaterally with mild inspiratory crackles  at the bases, which clear with coughing and likely represent  atelectasis.  The patient had increased work of breathing.  ABDOMEN:  Has a left lower quadrant ostomy with green ostomy output.  No  evidence of gross blood or mucus.  Abdomen is otherwise soft and  nontender, obese.  Exam is otherwise limited by body habitus.  There is  no rebound or guarding.  MUSCULOSKELETAL:  The patient does have bilateral CVA tenderness.  There  is a well healed tracheostomy scar.  EXTREMITIES:  Bilateral lower extremities are wrapped.  There is no  lower extremity edema.  NEUROLOGIC:  The patient is paraplegic but otherwise has no gross neuro  deficits.  He is alert and oriented x3.  GU: suprapubic catheter is in place draining clear yellow urine   LABORATORY DATA:  Urinalysis with a specific gravity of 1.015, pH 8.  There is moderate blood, positive nitrate, large leukocytes.  Negative  ketones.  Microscopy done reads 21-50 white blood cells, 7-10 red blood  cells and many bacteria.  CBC with a white blood cell count of 6.6,  hemoglobin 11.2, hematocrit 34.4, platelets 227.  Sodium 135, potassium  4.8, chloride 110, bicarb 20, BUN 126, creatinine 3.2.   ASSESSMENT/PLAN:  [name] is a 60 year old gentleman with long  history of T7, T8 paraplegia who currently resides at a skilled nursing  facility and presents in acute renal failure.  1. Acute renal failure:  Will consider that this may be prerenal given      decreased p.o. intake and recent viral illness with nausea,      vomiting.  Also with reported history of recent C. diff.  Also      consider this may be obstructive with dilated left ureteral stone      or post-infectious.  We will check a urine culture, ASO antibody      titer, urine electrolytes, urine creatinine, urine protein,  UPC,      urine eosinophils.  Will consult urology in the morning regarding      left ureteral obstruction.  Will also check an ABG given a bicarb      of 20 on I-STAT  BMP.  Will go ahead and start IV fluids and follow      daily electrolytes and creatinine.  I am going to hold his      oxybutynin. Initiate antibiotic therapy with rocephin.  2. Positive Hemoccult and question of C. diff:  Will continue Flagyl,      which was initiated at the nursing home to be continued until October 14, 2008.  Will go ahead and send a C. diff toxin as well as repeat      Hemoccult.  3. Anemia:  This is mild and improved since his prior visit at this      hospital, at which time his hemoglobin was 9.  4. Chronic pain.  Will continue current medications.  5. Seizure disorder, apparently well controlled.  Will continue home      Depakote and Keppra.  6. Prophylaxis:  The patient will be placed on subcutaneous heparin as      well as p.o. Protonix for gastroesophageal reflux disease and deep      vein thrombosis prophylaxis.  7. The patient is FULL CODE.  We will go ahead and consult case      management as well as PT therapy in anticipation of return to      nursing facility.      Sarajane Marek, MD  Electronically Signed     HH/MEDQ  D:  10/08/2008  T:  10/09/2008  Job:  [id number]

## 2010-12-22 NOTE — H&P (Signed)
[name], [name] NO.:  [id number]   MEDICAL RECORD NO.:  [id number]          PATIENT TYPE:  INP   LOCATION:  2903                         FACILITY:  [facility]   PHYSICIAN:  Della Goo, M.D. DATE OF BIRTH:  April 19, 1951   DATE OF ADMISSION:  07/30/2007  DATE OF DISCHARGE:                              HISTORY & PHYSICAL   PRIMARY CARE PHYSICIAN:  Dr. Baltazar Najjar, [facility].   CHIEF COMPLAINT:  Abdominal pain.   HISTORY OF PRESENT ILLNESS:  This is a 60 year old male resident of  [facility] nursing home who was sent to the emergency department  secondary to complaints of severe abdominal pain for three days.  The  pain was described as being a constant pain and 8/10.  The patient began  to spike temperatures to 102.2 at the nursing home, and was sent for  evaluation of his temperature, abdominal pain, and also he had been  passing bloody urine over the past day described as being red blood with  clots.  The patient denies having any nausea, vomiting.  Does report  having fevers and chills.  The patient denies having any nausea,  vomiting, no changes in stool pattern.  The patient does have colostomy,  but also has a suprapubic Foley catheter secondary to neurogenic  bladder.  The patient has a history of T7-T8 paraplegia since a fall in  March 1974.  The patient has had multiple hospitalizations for recurrent  urinary tract infections, MRSA wound infections from recurrent infected  decubiti.   PAST MEDICAL HISTORY:  Significant for paraplegia as mentioned above  status post fall in March 1974, chronic pain syndrome, neurogenic  bladder, recurrent urinary tract infections, recurrent decubitus ulcers,  seizure disorder, anxiety/depression.   PAST SURGICAL HISTORY:  Significant for a left hip surgery secondary to  avascular necrosis, status post flap procedure for revision of a stage  IV sacral decubitus ulceration, status post  cholecystectomy.   MEDICATIONS:  1. Keppra 1000 mg one p.o. b.i.d.  2. Depakote 250 mg one p.o. b.i.d.  3. Multivitamin one p.o. daily.  4. Omeprazole 20 mg one p.o. daily.  5. Aspirin 81 mg one p.o. daily.  6. Claritin 10 mg one p.o. daily.  7. Zoloft 150 mg one p.o. daily.   ALLERGIES:  Allergies to CIPROFLOXACIN and MINOCYCLINE.   SOCIAL HISTORY:  The patient is a nursing home resident, nonsmoker,  nondrinker.   FAMILY HISTORY:  Noncontributory.   REVIEW OF SYSTEMS:  Pertinent for mentioned above.   PHYSICAL EXAMINATION:  GENERAL:  This is a 60 year old obese,  debilitated paraplegic male in acute distress.  VITAL SIGNS:  Temperature 102.4, blood pressure 84/47, heart rate 134,  respirations 20, O2 saturations 94%.  HEENT:  Normocephalic, atraumatic.  Pupils equally round, reactive to light.  Extraocular muscles are  intact.  No scleral icterus.  Oropharynx:  Dry oral mucosa.  NECK:  Supple, full range of motion.  No thyromegaly, adenopathy,  jugulovenous distention.  CARDIOVASCULAR:  Tachycardic rate and rhythm.  No murmurs, gallops or  rubs.  LUNGS:  Clear to auscultation bilaterally.  ABDOMEN:  Positive bowel sounds.  Positive surgical scars present mid  abdomen, colostomy present on the left lower quadrant area.  Also a  suprapubic catheter is present as well, and this is passing dark red  sanguineous fluid.  BACK:  The patient has a stage IV sacral decubitus  ulceration which is packed with yellowish exudation.  Also there are  ischial decubitus ulcerations which are stage III or stage IV which also  have packing in place as well and have  yellowish drainage.  Bilateral  lower extremities with decubitus ulcerations both heels; both lower  extremities are wrapped in a dry bandage dressing.   LABORATORY STUDIES:  White blood cell count 24.4, hemoglobin 9.1,  hematocrit 27.8, platelets 380, neutrophils 90%, lymphocytes 4%.  Chemistry with sodium 135, potassium 5.4,  chloride 23, BUN 30,  creatinine 2.33, glucose 124, bicarb level 23.  Liver function test  reveals an albumin 3.5, total bilirubin 1.1, alkaline phosphatase 108.  AST 25, ALT 22, lipase 26.  Urinalysis turbid, red color, large blood,  greater than 300 protein, positive nitrites, many bacteria on  microscopic.   ASSESSMENT:  A 60 year old male being admitted with:   1. Sepsis and septic shock.  2. Urinary tract infection with hematuria.  3. Multiple infected decubitus ulcerations.  4. Chronic renal insufficiency with acute renal failure.  5. Seizure disorder.  6. History of paraplegia and neurogenic bladder.   PLAN:  The patient will be admitted to an ICU area for cardiac  monitoring.  IV fluids have been ordered along with IV antibiotic  therapy of vancomycin.  The patient received one dose of gentamicin in  the emergency department as well.  The gentamicin has been discontinued,  and patient will be placed on meropenem therapy along with the  vancomycin.  IV access has been a problem with this patient.  Critical  care medicine has been asked to place a central line for IV access for  fluid resuscitation and management of all of his IV medications.  A  wound care evaluation has also been requested for evaluation and  treatment.  The patient will have wet-to-dry dressing changes until the  evaluation has been completed.  He will continue his medications at this  time.  DVT and GI prophylaxis have been ordered.      Della Goo, M.D.  Electronically Signed     HJ/MEDQ  D:  07/31/2007  T:  07/31/2007  Job:  [id number]   cc:   Maxwell Caul, M.D.

## 2010-12-25 NOTE — H&P (Signed)
NAMEDARVIN, DIALS NO.:  [id number]   MEDICAL RECORD NO.:  [id number]          PATIENT TYPE:  EMS   LOCATION:  MAJO                         FACILITY:  [facility]   PHYSICIAN:  Isidor Holts, M.D.  DATE OF BIRTH:  Oct 12, 1950   DATE OF ADMISSION:  08/03/2005  DATE OF DISCHARGE:                                HISTORY & PHYSICAL   PRIMARY CARE PHYSICIAN:  Dr. Leanord Hawking, [facility]   CHIEF COMPLAINT:  Altered mental status for 2-3 days.   HISTORY OF PRESENT ILLNESS:  This is a 60 year old male.  For past medical  history, see below.  Resident of [facility], referred by EMS for altered  mental status for about two to three days.  Patient is quite drowsy,  difficult to arouse, and once aroused is unable to contribute history at the  time of this evaluation.  However, per the ED M.D., there is no antecedent  pyrexia, shortness of breath, nausea, vomiting, or diarrhea.   PAST MEDICAL HISTORY:  1.  Paraplegia secondary to spinal cord injury following fall from March      1974.  2.  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  3.  Left hip MRSA infection complicating #2 above.  4.  Sacral and bilateral heel decubiti.  5.  Neurogenic bladder status post suprapubic catheter.  6.  Past history of recurrent UTIs.  7.  Depression/anxiety.  8.  Status post cholecystectomy.  9.  Chronic pain syndrome.   MEDICATIONS:  1.  Lyrica 150 mg p.o. b.i.d.  2.  Carafate slurry 1 g q.a.c. and h.s.  3.  Protonix 40 mg p.o. daily.  4.  Baclofen 20 mg p.o. b.i.d.  5.  Humibid LA one p.o. b.i.d.  6.  Prilosec 20 mg p.o. daily.  7.  Multivitamin one p.o. daily.  8.  Zinc 220 mg p.o. daily.  9.  Bactrim DS one p.o. b.i.d. 10-day course completed on July 30, 2005.  10. MS-Contin 75 mg p.o. q.8h.  11. Ferrous sulfate 325 mg p.o. daily.  12. Senokot one p.o. b.i.d.  13. Trileptal 300 mg  p.o. q.h.s.  14. FiberLax 625 mg two tablets daily.  15. Flonase nasal spray two sprays each nostril daily.  16. Vitamin C 500 mg p.o. daily.  17. K-Dur 20 mEq p.o. b.i.d.  18. Zoloft 150 mg p.o. b.i.d.  19. Lactinex granules one p.o. daily.   ALLERGIES:  CIPROFLOXACIN/MINOCYCLINE.   REVIEW OF SYSTEMS:  Unobtainable.   FAMILY/SOCIAL HISTORY:  Patient is a resident of [address].  Nonsmoker.  Nondrinker.  Has no history of drug abuse.  Family history is  unobtainable and otherwise noncontributory.   PHYSICAL EXAMINATION:  VITAL SIGNS:  Temperature 98.2, pulse 87 per minute,  regular, respiratory rate 16, blood pressure initially 70/26 mmHg.  After  bolus of 500 mL normal saline in the emergency department blood pressure was  rechecked and is now 98/38 mmHg.  Pulse oximeter 98% on 15 L of oxygen by  non-rebreather mask.  GENERAL:  Patient appears drowsy on non-rebreather mask.  No obvious  cyanosis.  Difficult to arouse.  Once awake unable to answer questions and  falls asleep again soon after.  HEENT:  No clinical pallor.  No jaundice.  No conjunctival injection.  NECK:  Supple.  JVP not seen.  No palpable lymphadenopathy.  No palpable  goiter.  CHEST:  Patient has fine bibasilar crackles.  No wheezes.  HEART:  Sounds 1 and 2 heard.  Normal.  Regular.  No murmurs.  ABDOMEN:  Obese.  Surgical scars are noted.  Abdomen is otherwise soft,  nontender.  There is no palpable hepatomegaly or splenomegaly.  Patient has  a large palpable hard mass in the right lower quadrant, but bowel sounds  appear normal.  EXTREMITIES:  Lower extremity examination:  Healed scar is noted in the left  hip, although the upper part is under dry dressings.  There is no lower  extremity edema.  Patient has bilateral heel and digital decubiti in the  feet.  I am unable to check for sacral decubitus, secondary to morbid  obesity and lack of cooperation at this time.  CENTRAL NERVOUS SYSTEM:   Patient is paraplegic.  MUSCULOSKELETAL:  Not formally examined.   INVESTIGATIONS:  CBC:  WBC 11.4, hemoglobin 12.9, hematocrit 38.4, platelets  215.  Electrolytes:  Sodium 138, potassium 6.1, hemolyzed sample, chloride  109, CO2 28, BUN 26, creatinine 1.4, glucose 132.  AST 75, ALT 36, alkaline  phosphatase 213.  Urinalysis:  Specific gravity 1.016, pH 6.5, leukocyte  esterase large, wbc 21-50, rbc 0-2, bacteria many.  Few yeast are noted.  Chest x-ray dated August 03, 2005 shows CHF and edema.   ASSESSMENT/PLAN:  1.  Altered mental status: For the past 2-3 days.  This appears to consist      mainly of drowsiness and difficulty in rousing.  I suspect over-      sedation, as patient is on relatively high doses of MS-Contin.      Hypoventilation secondary to this, is a possibility as patient is also      morbidly obese.  We shall check ABGs.  Meanwhile, give Narcan single      dose 2 mg stat and if ABGs indicate hypercapnia, patient may likely need      short-term BiPAP.  We shall admit patient to stepdown unit for close      monitoring/observation.   1.  Pulmonary edema/Congestive heart failure on chest x-ray. Query secondary      to fluid overload, from aggressive hydration, on initial presentation to      the emergency department.  As systolic blood pressure is now in the 90s,      we will risk 20 mg of Lasix intravenously, stat and then monitor.   1.  Urinary tract infection.  Patient is allergic to ciprofloxacin.  We      shall treat with Primaxin, do blood cultures and urine cultures for      completeness.   1.  Right lower quadrant abdominal mass on palpation.  We shall arrange      abdominal CT scan to rule out a neoplasm, especially as LFTs are      abnormal.   1.  Mild hyperkalemia.  This was obtained in a hemolyzed sample.  We shall      recheck this and address as indicated.   1.  Sacral/Heel decubiti/History of methicillin-resistant Staphylococcus     aureus.   Contact precautions will be initiated and wound  management      consultation requested.   1.  Chronic pain syndrome.  We shall continue Lyrica and hold opioids until      patient is more alert, then will address appropriately.   1.  Paraplegia.  Will continue Baclofen, institute prophylaxis of decubiti.      Also institute deep venous thrombosis and gastrointestinal prophylaxis      with Lovenox and proton-pump inhibitor,respectively.  Further management will depend on clinical course.      Isidor Holts, M.D.  Electronically Signed     CO/MEDQ  D:  08/03/2005  T:  08/03/2005  Job:  [id number]   cc:   Maxwell Caul, M.D.  Fax: [phone]

## 2010-12-25 NOTE — Discharge Summary (Signed)
[name], OSOWSKI              ACCOUNT NO.:  [id number]   MEDICAL RECORD NO.:  [id number]          PATIENT TYPE:  INP   LOCATION:  2041                         FACILITY:  [facility]   PHYSICIAN:  Michaelyn Barter, M.D. DATE OF BIRTH:  Sep 27, 1950   DATE OF ADMISSION:  08/03/2005  DATE OF DISCHARGE:                                 DISCHARGE SUMMARY   FINAL DIAGNOSES AT THE TIME OF DISCHARGE:  1.  Altered mental status.  2.  Urinary tract infection secondary both Escherichia coli and Pseudomonas.  3.  Multiple decubitus ulcers.  4.  Hypokalemia.   SECONDARY DIAGNOSES:  1.  History of congestive heart failure/pulmonary edema.  2.  Chronic pain syndrome.  3.  Paraplegia.   PRIMARY CARE PHYSICIAN:  Maxwell Caul, M.D.   HISTORY OF PRESENT ILLNESS:  [name] is a 60 year old gentleman who  arrived secondary to developing altered mental status over the 2-3 days  prior to this admission. He is a resident of [facility].  When he arrived,  he appeared to be drowsy, difficult to arouse.   PAST MEDICAL HISTORY:  Please see the history and physical dictated by  Isidor Holts, M.D. on August 03, 2005.   HOSPITAL COURSE:  Problem 1.  Altered mental status.  When the patient was  initially evaluated,  Dr. Brien Few noted that he suspected the patient may have  been over-sedated secondary to high doses of MS Contin.  The patient was  provided with Narcan following his presentation to the hospital, and his  pain medications were limited.  MS Contin was not provided to the patient  over the course of his hospitalization.  He did occasionally receive  Dilaudid 1 mg IV q.6 h. p.r.n.  Following his admission into the hospital  and over the ensuing days, the patient's mentation gradually improved.  During my rounds, I have observed the patient to be alert and oriented.  His  altered mental status appears to have resolved as of today.   Problem 2.  Urinary tract infection.  Following his  admission into the  hospital, the patient had a urinalysis completed.  It confirmed the presence  of a urinary tract infection with wbc count of 21-50, bacteria being many.  Subsequent to this, the patient had a urine culture completed on August 03, 2005, the results of which were Escherichia coli and Pseudomonas  aeruginosa, greater than 100,000 colonies.  Sensitivities were reported  positive for ceftriaxone.  On August 06, 2005, the patient subsequently  was changed to ceftriaxone 1 g IV q.24 h.  The patient has not demonstrated  any other symptoms secondary to his UTI.  The urinary tract infection may  have also contributed to the altered mental status.   Problem 3.  History of congestive heart failure/pulmonary edema.  Following  his presentation to the hospital, the patient had a chest x-ray completed,  the final impression of which was congestive heart failure with edema.  The  patient's I's and O's have been monitored closely daily.  Likewise, he has  also been provided with supplemental Lasix during this hospitalization.  The  patient has not complained of any shortness of breath or symptoms related  to CHF, and actually as of today, the patient looks very comfortable  breathing without any supplemental O2.   Problem 4.  Multiple decubitus ulcers.  The wound care nurse has been  consulted regarding this, and their recommendations have been initiated.   Problem 5.  Chronic pain syndrome.  Again, it was felt that the patient's  pain medications may have contributed to his mental status changes prior to  coming into the hospital.  His OxyContin has been held, and he has not  received any over the course of his hospitalization, and the patient's  complaints of pain have been minimal throughout the course of his  hospitalization.   Problem 6.  Hypokalemia.  The patient's potassium has been supplemented  throughout the course of his hospitalization with IV Kay Ciel and p.o.  K-  Dur.   Problem 7.  History of paraplegia.  This has simply been monitored.   CONDITION AT THE TIME OF DISCHARGE:  Improved.   The patient's vitals today are as follows:  His temperature is 99, heart  rate 84, respirations 20, blood pressure 114/60, O2 saturation 93% on 2 L,  I's and O's -1530.   DISCHARGE MEDICATIONS:  1.  Cefepime 1 g IV q.12 h.  The patient has already completed two days of      this.  He will need an additional five days of cefepime 1 g IV q.12 h.  2.  Vitamin C 500 mg p.o. daily.  3.  Baclofen 20 mg p.o. b.i.d.  4.  Ferrous sulfate 325 mg p.o. daily.  5.  Multivitamin 1 capsule p.o. daily.  6.  Prilosec 20 mg daily.  7.  Lyrica 150 mg p.o. b.i.d.  8.  Senokot 1 tablet b.i.d.  9.  Zoloft 150 mg q.h.s.  10. Albuterol 2.5 mg via MDI q.2 h. p.r.n.  11. In addition, the patient was taking MS Contin 75 mg p.o. q.8 h. prior to      his admission into the hospital;  however, this was held.  12. In addition, the Carafate slurry was held.  13. He takes Flonase nasal spray 2 sprays in each nostril daily.  14. Lactinex granules 1 tablet daily.      Michaelyn Barter, M.D.  Electronically Signed     OR/MEDQ  D:  08/07/2005  T:  08/07/2005  Job:  [id number]   cc:   Maxwell Caul, M.D.  Fax: [phone]

## 2010-12-25 NOTE — H&P (Signed)
[name], [name]              ACCOUNT NO.:  [id number]   MEDICAL RECORD NO.:  [id number]          PATIENT TYPE:  INP   LOCATION:  6710                         FACILITY:  [facility]   PHYSICIAN:  Della Goo, M.D. DATE OF BIRTH:  [birth year]   DATE OF ADMISSION:  05/06/2006  DATE OF DISCHARGE:                                HISTORY & PHYSICAL   CHIEF COMPLAINT:  Fevers.   HISTORY OF PRESENT ILLNESS:  This is a 60 year old male paraplegic, who  resides at [facility], who was brought to the emergency  department secondary to worsening symptoms of fevers and chills over a 24-  hour period.  The patient reports having fevers and chills for one week and  per staff report wounds have been oozing foul-smelling drainage.  The  patient has been on antibiotic therapy for a six-week course which started  on April 12, 2006 with the antibiotics Rifampin 600 mg 1 p.o. b.i.d. and  Bactrim DS 1 p.o. b.i.d.  The patient denies having any cough, nausea,  vomiting, chest pain or shortness of breath.  He also has chronic spasticity  and has an indwelling suprapubic catheter secondary to neurogenic bladder  from his paraplegia.   PAST MEDICAL HISTORY:  1. History of paraplegia.  2. Neurogenic bowel and bladder.  3. Anemia.  4. Chronic pain.  5. Chronic anxiety.  6. Recurrent urinary tract infections.  7. Multiple decubitus ulcers.   MEDICATIONS:  1. Toprol XL 25 mg 1 p.o. b.i.d.  2. Klonopin 0.5 mg 1 p.o. b.i.d.  3. Baclofen 20 mg 1 p.o. b.i.d.  4. Prilosec 20 mg 1 p.o. b.i.d.  5. Multivitamin with minerals 1 p.o. daily.  6. Vitamin C 500 mg 1 p.o. daily.  7. Duragesic patch 50 mcg 1 tablet q.72h.  8. Albuterol inhaler 2 puffs q.6h. p.r.n.  9. Depakote ER 500 mg 2 tablets p.o. q.h.s.  10.Iron sulfate 325 mg 1 p.o. t.i.d.  11.Seroquel 100 mg 1 p.o. b.i.d.  12.Zoloft 100 mg 1 p.o. daily.  13.Vicodin 5/500 1 p.o. q6h. p.r.n. pain.  14.Ambien CR 12.5 mg 1 p.o. q.h.s. p.r.n.  sleeplessness.  15.Rifampin 600 mg 1 p.o. b.i.d. as mentioned above.  16.Bactrim DS 1 p.o. b.i.d. as also mentioned above.  17.The patient also has p.r.n. medication of throat lozenges, artificial      tears and Senokot.   ALLERGIES:  1. CIPROFLOXACIN.  2. MINOCYCLINE.   SOCIAL HISTORY:  The patient is a nursing home resident.  Nonsmoker,  nondrinker.   FAMILY HISTORY:  Noncontributory.   REVIEW OF SYSTEMS:  As mentioned above.   PHYSICAL EXAMINATION:  A 60 year old obese paraplegic male, currently  diaphoretic, however, in no acute distress.  VITAL SIGNS:  Temperature 101.1, blood pressure 127/79, heart rate 114,  respirations 20, O2 saturations 93%.  HEENT:  Normocephalic and atraumatic.  Pupils are equal, round, and reactive  to light.  Extraocular muscles are intact.  Oropharynx is clear.  No  exudates or erythema.  NECK:  Supple.  Full range of motion.  No thyromegaly, adenopathy, jugular  venous distention.  CARDIOVASCULAR:  Tachycardic rate  and rhythm.  No murmurs, gallops, or rubs.  LUNGS:  Clear to auscultation bilaterally.  ABDOMEN:  Obese, midline old surgical scar present, nontender.  No palpable  hepatosplenomegaly.  Positive suprapubic catheter present draining dark  orange urine.  EXTREMITIES:  Trace bilateral lower extremity edema.  Both lower legs are  dressed to the distal one-third lower extremity.  Decubitus wounds present  on both lower extremities and on the bilateral ischial areas.  The bilateral  ischial area wounds are stage 3 to stage 4 and are draining foul-smelling  brownish exudate.  NEUROLOGICAL:  The patient is alert and oriented x3.  The patient is  paraplegic.  His cranial nerves are intact.  The patient is able to move his  upper extremities, unable to move his lower extremities.  The patient has  lack of sensation on examination at approximately the T8 level.   LABORATORY DATA:  On admission reveals a white blood cell count of 13.8,   hemoglobin 13.3, hematocrit 39.0, platelets 260,000.  Sodium level 136,  potassium 4.4, chloride 104, CO2 25.4, BUN 16, creatinine 1.0 and glucose  110.  Blood cultures have been sent x2.  A urinalysis and urine culture have  also been sent.  Chest x-ray and EKG:  EKG result revealing a sinus  tachycardia without acute ST segment changes.   ASSESSMENT:  1. This is a 60 year old paraplegic male with a suprapubic catheter and      chronic recurrent urinary tract infections and multiple decubitus      ulcerations being admitted with sepsis.  2. Infected ischial decubitus ulcers bilaterally.  3. Urinary tract infection probable treatment for methicillin-resistant      Staphylococcus aureus of the urine.  4. Anemia.  5. Paraplegia history.  6. Neurogenic bowel and bladder.  7. Chronic pain syndrome.  8. Multiple spasticity.  9. Anxiety/depression.   PLAN:  The patient will be placed on IV antibiotic therapy of vancomycin,  Zosyn and Amacasin.  The patient was administered a single dose of IV  vancomycin in the emergency department along with one dose of cefepime.  A  wound care evaluation will be ordered and cultures of the wounds have been  requested.  The patient will continue with wet-to-dry dressing changes  b.i.d. until further recommendations are made for care of his stage 3 and 4  decubitus ulcers.  The patient will continue his on regular medications;  however, his blood pressure medications  have been placed on hold secondary to hypotension.  The patient will also  receive rehydration/fluid resuscitation therapy.  The patient will also be  placed on supplemental therapy of vitamin C multivitamin and zinc to help  promote wound healing.  The patient will also be placed on GI and DVT  prophylaxis.      Della Goo, M.D.  Electronically Signed     HJ/MEDQ  D:  05/07/2006  T:  05/08/2006  Job:  [id number]  cc:   Maxwell Caul, M.D.

## 2010-12-25 NOTE — Discharge Summary (Signed)
NAMECHADRIC, Maurice NO.:  [id number]   MEDICAL RECORD NO.:  [id number]          PATIENT TYPE:  INP   LOCATION:  6710                         FACILITY:  [facility]   PHYSICIAN:  Michaelyn Barter, M.D. DATE OF BIRTH:  1951/01/17   DATE OF ADMISSION:  05/06/2006  DATE OF DISCHARGE:                                 DISCHARGE SUMMARY   [facility] INTER[facility] DISCHARGE SUMMARY:   PRIMARY CARE PHYSICIAN:  Maxwell Caul, M.D.   FINAL DIAGNOSES:  1. Sepsis.  2. Urinary tract infection secondary to Escherichia coli.  3. Infected suprapubic catheter site.  4. Multiple large stage III and IV infected sacral decubitus ulcers.  5. Shock.  6. Hypoalbuminemia most likely secondary to severe protein calorie      malnutrition.  7. Mild hyponatremia.  8. Low T4.   CONSULTATIONS:  1. Nutrition.  2. Wound care nurse.   PROCEDURES:  1. Portable chest x-ray completed May 06, 2006.  2. Portable chest x-ray completed May 08, 2006.   HISTORY OF PRESENT ILLNESS:  [name] is a 60 year old paraplegic  gentleman who resides at [facility].  He began to develop  fevers and chills over the 24 hours prior to this admission.  It was  reported that his multiple decubitus ulcers had began to develop a foul  smell.  He stated that he has felt ill for several days; however, his  symptoms had progressed up until the time of this admission.  The patient  had been on antibiotics since September 4 consisting of rifampin 600 mg p.o.  b.i.d. and Bactrim DS one tablet p.o. b.i.d. with an intended course of 6  weeks.  The patient was brought to the hospital for further evaluation.   For past medical history, please see that dictated by Dr. Della Goo  on May 06, 2006.   HOSPITAL COURSE:  1. Sepsis.  This appeared to be multifactorial and consisting of a urinary      tract infection, infected suprapubic region as well as multiple  large      stage III and IV decubitus ulcers.  The patient was started on empiric      IV antibiotics consisting of vancomycin, Zosyn and amikacin.      Urinalysis, blood cultures and chest x-ray were completed.  2. Urinary tract infection secondary to E. coli.  The patient had a      urinalysis completed which confirmed the presence of a UTI.  Urine      cultures were ordered on September 28.  E. coli was found.  The      organism was found to be sensitive to ceftriaxone.  Therefore, Zosyn      was discontinued and ceftriaxone was started.  3. Infected suprapubic region.  The patient has a chronic indwelling      suprapubic catheter present.  Cultures were taken from this region on      May 07, 2006.  Abundant WBCs were seen.  Abundant gram-negative      rods, moderate gram-positive cocci in pairs and moderate gram-positive  in clusters were seen.  Moderate yeast consistent with candida species      was also seen.  The patient has been on vancomycin throughout the      course of this hospitalization and currently is on ceftriaxone also.      Final sensitivities have not been reported nor have final organism      identified.  Once these are reported, any changes necessary in the      patient's antibiotics should be done.  Diflucan will be started for the      patient's yeast.  4. Multiple sacral decubitus ulcers.  The patient does have multiple large      stage III and IV sacral decubitus ulcers.  He also has some ulcers on      both of his lower extremities.  Cultures from the sacral decubitus      ulcers which were very foul smelling at the time of this admission have      grown moderate E. coli thus far sensitive to ceftriaxone.  Again,      ceftriaxone has been implemented.  The wound care nurse has been      consulted regarding this.  They have continued to follow the patient      during the hospital course.  The patient may need further surgical      evaluation in the  future given the size of his decubitus ulcers.  5. Shock/hypotension.  At the time of the patient's admission, his blood      pressure was noted to be 127/79.  However, over the course of his      hospitalization, his blood pressure had initially declined to a low of      78/34 by 05/08/2006.  It was believed that this may have been      multifactorial including sepsis, dehydration and also the patient does      have a history of paraplegia.  Therefore, there may have been some      autonomic dysfunction contributing to this.  The patient has been      aggressively hydrated during the course of this hospitalization.  His      cortisol level was checked and was found to be normal, however, on      September 30 with a result of 11.5.  Likewise, TSH and T4 have also      been ordered.  The TSH is within the normal range.  The T4 is slightly      low.  Therefore, there may be some subclinical hypothyroidism      contributing to this also that is questionable.  Again, the patient's      symptoms have improved throughout the course of his hospitalization.      He has received a combination of IV fluids as well as IV albumin.  His      blood pressure may need to be monitored closely throughout the      remainder of his hospitalization.  6. Cough.  The patient has complaint of a cough off and on during the      course of his hospitalization.  A chest x-ray was completed at the time      of admission which revealed low volume exam with bibasilar atelectasis,      no CHF or acute pneumonia seen.  The patient has been provided an      incentive spirometer.  A repeat chest x-ray was completed on September  30 which was interpreted as improved aeration.  Again, the patient has      been on IV antibiotics throughout the course of his hospitalization.  7. Hypoalbumin/very low prealbumin level.  This more than likely is      associated with severe protein calorie malnutrition.  A nutrition      consultation has been employed.  Nutrition has continued to follow the      patient throughout his hospitalization.  8. Mild hyponatremia.  The patient's sodium level at the time of admission      was 136.  By September 29, it had declined to 133.  The patient was      otherwise asymptomatic secondary to that but since that time sodium has      rebounded to a high of 139 as of today, May 10, 2006.  9. Dehydration.  Again, this may have contributed to the patient's      hypotension.  The patient has been provided with aggressive IV fluid      hydration throughout the course of his hospitalization.  The patient's      albumin level was noted to have been 1.8 as of October 2 and his      prealbumin level was noted to have been 5.4 which is very low.  Again,      the patient has been encouraged to eat and nutrition has been      consulted.  10.Low free T4.  Free T4 level was drawn on October 1 and it was found to      be 0.66.  A TSH was drawn on the same day and it was found to be 3.448.      The significance of that low T4 level is questionable.  The patient may      or may not have subclinical hypothyroidism.  This issue will have to be      further worked up, possibly either      during the course of this hospitalization or after the patient is      discharged from the hospital.  The patient may have to follow up with      his primary care physician and/or an endocrinologist for further      evaluation of this.   This brings me up to May 10, 2006.      Michaelyn Barter, M.D.  Electronically Signed     OR/MEDQ  D:  05/10/2006  T:  05/10/2006  Job:  [id number]   cc:   Maxwell Caul, M.D.

## 2010-12-25 NOTE — Discharge Summary (Signed)
[name], [name]              ACCOUNT NO.:  [id number]   MEDICAL RECORD NO.:  [id number]          PATIENT TYPE:  INP   LOCATION:  6713                         FACILITY:  [facility]   PHYSICIAN:  Lonia Blood, M.D.      DATE OF BIRTH:  01-Jul-1951   DATE OF ADMISSION:  05/06/2006  DATE OF DISCHARGE:  05/24/2006                                 DISCHARGE SUMMARY   ADDENDUM:   PRIMARY CARE PHYSICIAN:  Medical laboratory scientific officer.   1. Please refer multiple discharge summaries dictated by Drs. [place]      Roxan Hockey and [employer].  Last discharge summary was between      October 10 and today.  Nothing significant has happened to patient.  He      continues to be on antibiotics per infectious disease.  However,      attempt was being made to transfer the patient to [facility].      The idea is his leaking suprapubic catheter will need to be fixed prior      to any other surgical procedure to be undertaken in view of his      infected decubitus ulcer.  I have discussed with his primary urologist      at [facility], who is Dr. Gala Lewandowsky.  His number is [phone].      He has made it clear that the procedure he needs to do on the patient      is something he will do in his office and not the hospital.  For that      reason, he will not want the patient transferred to [facility].  Instead,      the patient shall be discharged to a skilled nursing facility from      where he will be transported to his office for him to do the procedure.      The facility shall call him and make arrangement on the date for the      transfer.  His telephone number, again, is [phone].  2. The patient continues to be on antibiotics treatment.  He has been      started on Keflex 500 mg q.i.d. yesterday in addition to vancomycin and      Flagyl.  The duration, at this point, is for a total of 6 weeks.  We      will consult from ID the plan for further treatment after this.  He      will probably need to  continue the vancomycin, which is day #16 today,      Flagyl, day #11, and Keflex, restarted yesterday, up until he is seen      by his surgeon.  For this reason, he has a PICC line in place.      Otherwise, the patient remains stable and everything is per previous      discharge summaries.      Lonia Blood, M.D.  Electronically Signed     LG/MEDQ  D:  05/24/2006  T:  05/24/2006  Job:  [id number]

## 2010-12-25 NOTE — Discharge Summary (Signed)
[name], [name]              ACCOUNT NO.:  [id number]   MEDICAL RECORD NO.:  [id number]          PATIENT TYPE:  INP   LOCATION:  6713                         FACILITY:  [facility]   PHYSICIAN:  Isidor Holts, M.D.  DATE OF BIRTH:  10-11-1950   DATE OF ADMISSION:  05/06/2006  DATE OF DISCHARGE:                                 DISCHARGE SUMMARY   ADDENDUM TO DISCHARGE SUMMARY   DATE OF DISCHARGE:  To be determined.   DISCHARGE DIAGNOSES:  Refer to interim summary dated May 10, 2006, by  Michaelyn Barter, M.D.   DISCHARGE MEDICATIONS:  1. Seroquel 50 mg p.o. b.i.d.  2. Depakote ER 1000 mg p.o. q.h.s.  3. Multivitamin one p.o. daily.  4. Zinc sulfate 220 mg p.o. b.i.d.  5. Lovenox 40 mg subcutaneously daily.  6. Aspirin 81 mg p.o. daily.  7. Protonix 40 mg p.o. daily.  8. Senokot S one p.o. q.h.s.  9. Vitamin C 500 mg p.o. daily.  10.Duragesic patch (50 mcg per hour) one patch to skin q.72 hours.  11.Baclofen 10 mg p.o. b.i.d.  12.Ferrous sulfate 325 mg p.o. t.i.d.  13.Zoloft 100 mg p.o. daily.  14.Juven packet (grape) nutritional supplement one packet p.o. b.i.d.  15.Keflex 500 mg p.o. t.i.d.  16.Metronidazole 500 mg p.o. t.i.d.  17.Beneprotein powder protein supplement 12 grams p.o. b.i.d.  18.Colace 100 mg p.o. b.i.d.  19.Vancomycin 1500 mg IV q.12 hours.  20.Ensure strawberry liquid nutritional supplement 237 mL p.o. b.i.d. with      food.   This medication list may, of course, be updated at the time of actual  discharge by discharging M.D.   For details of admission history, procedures, consultations, and detailed  clinical course, refer to interim summary dated May 10, 2006, by Dr.  Roxan Hockey.  However, in the subsequent period from May 11, 2006, to the  date of this dictation, May 18, 2006, the following are pertinent.   PROCEDURE:  The patient underwent the following procedures.  1. Chest x-ray dated May 12, 2006, which showed low lung volumes,  no      acute cardiopulmonary disease.  2. Pelvic CT scan dated May 15, 2006.  This showed new posterior      subcutaneous soft tissue infection and abscess at the level of the      lower lumbar paraspinous musculature and subcutaneous fat.  This      collection measures approximately 4.9 x 9.5 cm in greatest dimensions.      Slight enlargement of a collection in the posterior soft tissues of the      inferior pelvis.  This is visibly communicating through a fistulous      tract to the skin.  Stable destructive changes noted involving both      proximal femora.   CONSULTATIONS:  1. Courtney Paris, M.D., urologist.  2. Cherylynn Ridges, M.D., surgeon.  3. Cliffton Asters, M.D., infectious disease.   HOSPITAL COURSE:  In the period from May 11, 2006, to May 18, 2006,  the patient continued to improve steadily, clinically.  There were no acute  changes in his  general clinical condition, but sepsis was clearly being  brought under control.  It was deemed that this was due to a combination of  multiple infections, including Escherichia coli UTI, MRSA infected multiple  sacral decubitus, as well as infected suprapubic catheter site.  ID  consultation was called, which was kindly provided by Dr. Orvan Falconer, to help  rationalize and guide antibiotic treatment.  He has recommended continuing  Vancomycin therapy, but adding Keflex and Flagyl.  The patient was placed on  Diflucan for management of Candidiasis and this is scheduled to be completed  on May 19, 2006.  The patient's suprapubic catheter site was found out  to be quite leaky.  It was felt that this may have been contributory to  contamination of decubitus and also predisposed to Escherichia coli urinary  tract infection.  Urology consultation was called to help manage this, and  this was kindly provided by Dr. Aldean Ast, who notes that the patient  already has the largest size suprapubic tube and so predictably leaks.   He  has recommended that as the patient already has a planned bladder  augmentation procedure in the works by a urologist, this option should be  pursued diligently.  The patient's sacral decubiti have been managed with  local care, dressings, etc, by the wound management team.  Because of  discovery of deep pockets, in some cases, pus-containing, during the course  of the dressings, surgical debridement was recommended.  Surgical  consultation was called, which was kindly provided by Dr. Lindie Spruce, who has  evaluated the patient, reviewed the pelvic CT scan, the details of which are  elucidated in the procedure list above. His recommendations are that as the  patient already has plastic surgeons at [facility], attempts  should be made to transfer the patient there if at all feasible for  appropriate surgery.  Indeed it appears that the patient has in the past had  multiple previous flap procedures that included the right ischial and left  ischial areas.  The patient's mild hypernatremia has resolved, blood  pressure has normalized, and his hydration status is satisfactory.   DISPOSITION:  This will be elucidated in detail at the appropriate time by  discharging M.D.  However, the patient's clinical condition appears to have  stabilized.  He is now left with underlying chronic issues and  recommendations on the part of the appropriate specialists, ie, urologist  and surgeon, is to attempt transfer to a tertiary institution, in this case,  either [facility] or [facility] for continued and  possibly definitive management of both urologic and pelvic  decubital/infective problems.  One of the factors militating against an  expedited transfer, has been the fact that it has proved very problematic to  try to obtain the names of the surgeons who have previously worked on this patient.  The patient has in the past been under the care of Dr. Harmon Pier,  plastic  surgeon at [facility].  This was back in 2005.  I have  had a discussion with the patient, and he emphatically insists that he has  no intentions of going back there to have his surgery done.  He would prefer  to have this done at [facility] where he tells me a Dr.  Dareen Piano was his plastic surgeon.  Even more obscure, appears to be exactly  where the patient's urologist is located, sometimes the information seems to  be that he is at [facility], at other times the patient insists  that he is at [facility].  Of course, it stands to reason that if the patient's  urologist and his plastic surgeon are located in different geographic areas,  one has to prioritize as to which one he needs the most.  In my opinion, the  patient's urine leakage is probably the more urgent concern at  present due to fact that he will continue to run into problems from the  urology viewpoint if his bladder augmentation procedure or similar  intervention is not carried out.  We will continue to work diligently to  effect an appropriate transfer in the next few days.      Isidor Holts, M.D.  Electronically Signed     CO/MEDQ  D:  05/18/2006  T:  05/19/2006  Job:  [id number]   cc:   Maxwell Caul, M.D.

## 2010-12-25 NOTE — Consult Note (Signed)
[name], [name]              ACCOUNT NO.:  [id number]   MEDICAL RECORD NO.:  [id number]          PATIENT TYPE:  INP   LOCATION:  6713                         FACILITY:  [facility]   PHYSICIAN:  Courtney Paris, M.D.DATE OF BIRTH:  1950/08/25   DATE OF CONSULTATION:  05/11/2006  DATE OF DISCHARGE:                                   CONSULTATION   REQUESTED BY:  Dr. Brien Few.   REASON FOR CONSULTATION:  Leaking suprapubic tube.   A 60 year old male, paraplegic, resides at [facility], was  brought to the emergency room on the day of admission on May 06, 2006,  with fevers, chills, and apparent hypotension.  He had some foul-smelling,  purulent decubiti and has a chronic indwelling suprapubic cystostomy.  He  has had paraplegia since an accident in 63.  The suprapubic tube was  inserted at [facility] probably 7 or more years ago.  Apparently it  was leaking and he saw Dr. Wanda Plump for this in March.  It was felt that it  was just secondary to chronic spastic bladder, and that followup in [place]  would probably be appropriate.  He has had mixed growth from this.  It was  last changed about 2 weeks ago.  He has a #30 [place] suprapubic tube which  is the biggest size catheter we can get, and he predictably still leaks.  He  does not leak per penis, just around the tube somewhat.  His other problems  are, of course, his decubiti; he has neurogenic bowel and bladder, chronic  pain, and anemia.  He has had numerous admissions here this year.  He has  had congestive heart failure and pulmonary edema as well as chronic pain  syndrome.   He has ALLERGIES TO CIPRO AND MINOCYCLINE.   His medicines on admission included Toprol, Klonopin, Baclofen, Prilosec,  multivitamins, vitamin C, Duragesic patch 15 mcg every 72 hours, albuterol  inhaler, Depakote, iron, Seroquel, Zoloft, Vicodin, Ambien CR, Rifampin, and  Bactrim DS as needed.   He has had numerous operations  in the past for his suprapubic tube and he  has had suffered decubitus ulcers in the past.   He is single and lives at [facility].  He has been there for 9  months.   His 12-point review of systems is really negative except as above.  Nothing  except as noted.   He does have a family history of diabetes and heart disease.  He typically  runs a low albumin and nutrition is questionable.  He has a chronic cough  and does smoke.  No alcohol.   Laboratory values showed his white count is now down to less than 10,000.  His hematocrit is 34% and his creatinine is normal at 1 to 0.8.  He grew E.  coli from his decubitus ulcers from the sacrum with mixed sensitivity,  multiple Gram-positive cocci and Gram-negative rods from the suprapubic cath  site.   His temperature maximum today was 101.8.  His blood pressure was 108/63,  95/51.  His pulse 79, respirations 18.  He is a full,  gray-bearded, white-  haired man looking much older than his stated age, in no acute distress,  lying quietly in bed. HEENT is clear.  Neck is supple.  Chest is generally  clear.  A few rhonchi in the bases. His abdomen is obese and soft with a  transverse abdominal scar.  No masses, no tenderness.  The suprapubic tube  is a #30; seems to be dry around at the skin, looks really pretty good.  There is a little bit of retraction of where the suprapubic tube is  inserted.  Penis normal and circumcised, fully descended and normal-sized  testes.  We were unable to feel his prostate on exam.  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  He has the marked sacral  decubiti noted, with dressings.   IMPRESSION:  1. Paraplegia with chronic indwelling suprapubic catheter.  2. Chronic urinary tract infections.  3. Spastic neurogenic bladder.  4. Infected decubitus.   RECOMMENDATIONS:  Since he already has the largest suprapubic tube and still  predictably leaks, he says he was due to  have some kind of bladder  augmentation procedure done at [facility] by a urologist next week.  He also  has a Engineer, [employer] lined up to do some flaps for his sacral decubiti, but  that was going [place] be another city.  Do not really have anything to offer  regarding the suprapubic tube other than good wound care, to be gently  irrigated as necessary, but seems to be draining properly now.  I would not  continue the antibiotics any more than is needed for the wounds because he  will never clear the bacteria from his catheter and just develop more  resistance with ongoing antibiotic therapy.      Courtney Paris, M.D.  Electronically Signed     HMK/MEDQ  D:  05/11/2006  T:  05/12/2006  Job:  [id number]

## 2010-12-25 NOTE — Consult Note (Signed)
[name], [name]              ACCOUNT NO.:  [id number]   MEDICAL RECORD NO.:  [id number]          PATIENT TYPE:  INP   LOCATION:  6713                         FACILITY:  [facility]   PHYSICIAN:  Cherylynn Ridges, M.D.    DATE OF BIRTH:  [birth year]   DATE OF CONSULTATION:  DATE OF DISCHARGE:                                   CONSULTATION   Isidor Holts, M.D.  [facility]  [address]  [place] [address]   Dear Dr. Brien Few:   Thank you for asking me to see [name], a 60 year old, who seems to be  [name] since the 59s, who was admitted on May 06, 2006 with  sepsis from the [facility].  He has a history of a urinary  tract infection and a leaking suprapubic tube.  His medical history is  significant for a neurogenic bladder and long history of paraplegia, anemia,  chronic pain, anxiety, recurrent urinary tract infections and multiple  sacral decubitus ulcers.  He has received most of his care at the [facility] and [facility].  This is his first admission to [facility]  after being transferred from [place] on September28 with urinary tract  infection, MRSA infection of the sacral decubitus and acute  and chronic  problems.  I was asked to see the patient because his ulcer was deep and  likely needed surgical followup and this was recommended by the wound care  service.   I reviewed the patient's CT which suggests that he has some undrained  pockets of fluid around his sacral decubitus ulcer.  He has had multiple  previous procedures that already included flaps to the right ischial and  left ischial areas.  He was due to have a repeat surgical intervention by a  surgeon at [facility] by the patient's report, and also to have  surgery done on his leaking suprapubic catheter at [facility]  tomorrow.  The patient was seen in consultation by Dr. Vic Blackbird,  who did not offer any surgical procedures here.   However, he is supposedly  to have this done in an outside facility.   I do believe the patient has a drainable sacral; however, his course is very  complex because of the chronic nature of this process.  I would not  recommend any surgical treatment here seeing that we have very little  knowledge of what the patient has had done before and likely he requires  significant plastic surgical coverage that he cannot receive at this  institution.  I would wait diligently and suggest that the primary sugeons  be contacted,  and wait diligently to get him transferred back to the  centers where he has received primary care now that his sepsis has been  resolved and he needs to have continuation of his care.  I did not perform  any debridement today.      Cherylynn Ridges, M.D.  Electronically Signed     JOW/MEDQ  D:  05/15/2006  T:  05/16/2006  Job:  [id number]

## 2011-05-14 LAB — BLOOD GAS, ARTERIAL
Acid-base deficit: 4.4 — ABNORMAL HIGH
Bicarbonate: 21.4
Drawn by: 155271
O2 Content: 3
TCO2: 22.9
pCO2 arterial: 48.6 — ABNORMAL HIGH
pO2, Arterial: 193 — ABNORMAL HIGH

## 2011-05-14 LAB — CROSSMATCH
ABO/RH(D): A NEG
ABO/RH(D): A NEG
Antibody Screen: POSITIVE
Antibody Screen: POSITIVE
DAT, IgG: NEGATIVE
DAT, IgG: NEGATIVE

## 2011-05-14 LAB — CBC
HCT: 23 — ABNORMAL LOW
HCT: 23.7 — ABNORMAL LOW
HCT: 27.8 — ABNORMAL LOW
Hemoglobin: 7.8 — CL
Hemoglobin: 7.9 — CL
Hemoglobin: 9.1 — ABNORMAL LOW
MCHC: 32.4
MCHC: 33.1
MCHC: 33.4
MCV: 74.5 — ABNORMAL LOW
MCV: 80.1
Platelets: 317
Platelets: 317
RBC: 2.97 — ABNORMAL LOW
RBC: 3.07 — ABNORMAL LOW
RBC: 3.08 — ABNORMAL LOW
RBC: 3.47 — ABNORMAL LOW
RBC: 3.76 — ABNORMAL LOW
RDW: 19.2 — ABNORMAL HIGH
RDW: 20.2 — ABNORMAL HIGH
RDW: 20.9 — ABNORMAL HIGH
WBC: 23 — ABNORMAL HIGH
WBC: 6.3
WBC: 6.6

## 2011-05-14 LAB — RETICULOCYTES
RBC.: 3.21 — ABNORMAL LOW
Retic Count, Absolute: 102.7
Retic Ct Pct: 3.2 — ABNORMAL HIGH

## 2011-05-14 LAB — BASIC METABOLIC PANEL
CO2: 23
CO2: 23
CO2: 26
Calcium: 7.4 — ABNORMAL LOW
Calcium: 7.9 — ABNORMAL LOW
Calcium: 8.2 — ABNORMAL LOW
Calcium: 8.9
Chloride: 105
Creatinine, Ser: 0.61
Creatinine, Ser: 0.68
GFR calc Af Amer: 35 — ABNORMAL LOW
GFR calc Af Amer: >60
GFR calc Af Amer: >60
GFR calc Af Amer: >60
GFR calc Af Amer: >60
GFR calc non Af Amer: 29 — ABNORMAL LOW
GFR calc non Af Amer: >60
Glucose, Bld: 104 — ABNORMAL HIGH
Potassium: 4.3
Potassium: 5.4 — ABNORMAL HIGH
Sodium: 135
Sodium: 135
Sodium: 139

## 2011-05-14 LAB — HEPATIC FUNCTION PANEL
ALT: 22
AST: 25
Alkaline Phosphatase: 108
Bilirubin, Direct: 0.2
Total Bilirubin: 1.1

## 2011-05-14 LAB — DIFFERENTIAL
Eosinophils Relative: 0
Lymphocytes Relative: 4 — ABNORMAL LOW
Monocytes Absolute: 1.3 — ABNORMAL HIGH
Monocytes Relative: 6
Neutro Abs: 22 — ABNORMAL HIGH

## 2011-05-14 LAB — CULTURE, BLOOD (ROUTINE X 2)

## 2011-05-14 LAB — WOUND CULTURE: Gram Stain: NONE SEEN

## 2011-05-14 LAB — DIC (DISSEMINATED INTRAVASCULAR COAGULATION)PANEL
D-Dimer, Quant: 1.4 — ABNORMAL HIGH
D-Dimer, Quant: 1.91 — ABNORMAL HIGH
Fibrinogen: 574 — ABNORMAL HIGH
INR: 1.2
INR: 1.3
Platelets: 265
Prothrombin Time: 15.7 — ABNORMAL HIGH
Prothrombin Time: 16.2 — ABNORMAL HIGH

## 2011-05-14 LAB — CORTISOL: Cortisol, Plasma: 42.1

## 2011-05-14 LAB — URINE CULTURE

## 2011-05-14 LAB — HEMOGLOBIN AND HEMATOCRIT, BLOOD
HCT: 21.4 — ABNORMAL LOW
Hemoglobin: 7 — CL

## 2011-05-14 LAB — COMPREHENSIVE METABOLIC PANEL
AST: 18
BUN: 34 — ABNORMAL HIGH
CO2: 24
Calcium: 7.8 — ABNORMAL LOW
Creatinine, Ser: 2.63 — ABNORMAL HIGH
GFR calc Af Amer: 31 — ABNORMAL LOW
GFR calc non Af Amer: 25 — ABNORMAL LOW

## 2011-05-14 LAB — URINALYSIS, ROUTINE W REFLEX MICROSCOPIC
Glucose, UA: NEGATIVE
Ketones, ur: NEGATIVE
Leukocytes, UA: NEGATIVE
Protein, ur: >300 — AB

## 2011-05-14 LAB — IRON AND TIBC
Iron: <10 — ABNORMAL LOW
UIBC: 223

## 2011-05-14 LAB — CARBOXYHEMOGLOBIN
Carboxyhemoglobin: 1
Carboxyhemoglobin: 1.6 — ABNORMAL HIGH
Carboxyhemoglobin: 2.3 — ABNORMAL HIGH
Methemoglobin: 0.9
Methemoglobin: 1
Methemoglobin: 1.3
O2 Saturation: 68.9
O2 Saturation: 78.5
Total hemoglobin: 6.8 — CL
Total hemoglobin: 6.9 — CL

## 2011-05-14 LAB — I-STAT 8, (EC8 V) (CONVERTED LAB)
BUN: 35 — ABNORMAL HIGH
Bicarbonate: 21.4
Glucose, Bld: 137 — ABNORMAL HIGH
pCO2, Ven: 29.3 — ABNORMAL LOW
pH, Ven: 7.472 — ABNORMAL HIGH

## 2011-05-14 LAB — PHOSPHORUS: Phosphorus: 3.9

## 2011-05-14 LAB — LACTIC ACID, PLASMA: Lactic Acid, Venous: 0.8

## 2011-05-14 LAB — VANCOMYCIN, TROUGH: Vancomycin Tr: 21 — ABNORMAL HIGH

## 2011-05-14 LAB — LIPASE, BLOOD: Lipase: 20

## 2011-06-14 ENCOUNTER — Other Ambulatory Visit: Payer: Self-pay

## 2011-06-14 ENCOUNTER — Emergency Department (HOSPITAL_COMMUNITY)
Admission: EM | Admit: 2011-06-14 | Discharge: 2011-06-15 | Disposition: A | Discharge status: Home / Self Care | Payer: Medicare Other | Attending: Emergency Medicine | Admitting: Emergency Medicine

## 2011-06-14 ENCOUNTER — Emergency Department (HOSPITAL_COMMUNITY): Payer: Medicare Other

## 2011-06-14 ENCOUNTER — Encounter: Payer: Self-pay | Admitting: Emergency Medicine

## 2011-06-14 DIAGNOSIS — R29810 Facial weakness: Secondary | ICD-10-CM | POA: Insufficient documentation

## 2011-06-14 DIAGNOSIS — I2510 Atherosclerotic heart disease of native coronary artery without angina pectoris: Secondary | ICD-10-CM | POA: Insufficient documentation

## 2011-06-14 DIAGNOSIS — G8220 Paraplegia, unspecified: Secondary | ICD-10-CM | POA: Insufficient documentation

## 2011-06-14 DIAGNOSIS — G459 Transient cerebral ischemic attack, unspecified: Secondary | ICD-10-CM | POA: Insufficient documentation

## 2011-06-14 DIAGNOSIS — L89109 Pressure ulcer of unspecified part of back, unspecified stage: Secondary | ICD-10-CM | POA: Insufficient documentation

## 2011-06-14 DIAGNOSIS — L8990 Pressure ulcer of unspecified site, unspecified stage: Secondary | ICD-10-CM | POA: Insufficient documentation

## 2011-06-14 DIAGNOSIS — Z7982 Long term (current) use of aspirin: Secondary | ICD-10-CM | POA: Insufficient documentation

## 2011-06-14 DIAGNOSIS — R51 Headache: Secondary | ICD-10-CM | POA: Insufficient documentation

## 2011-06-14 DIAGNOSIS — R471 Dysarthria and anarthria: Secondary | ICD-10-CM | POA: Insufficient documentation

## 2011-06-14 DIAGNOSIS — Z79899 Other long term (current) drug therapy: Secondary | ICD-10-CM | POA: Insufficient documentation

## 2011-06-14 HISTORY — DX: Atherosclerotic heart disease of native coronary artery without angina pectoris: I25.10

## 2011-06-14 LAB — CBC
HCT: 32.3 % — ABNORMAL LOW (ref 39.0–52.0)
Hemoglobin: 9.7 g/dL — ABNORMAL LOW (ref 13.0–17.0)
MCH: 24.1 pg — ABNORMAL LOW (ref 26.0–34.0)
MCHC: 30 g/dL (ref 30.0–36.0)
MCV: 80.1 fL (ref 78.0–100.0)

## 2011-06-14 LAB — BASIC METABOLIC PANEL
BUN: 18 mg/dL (ref 6–23)
Calcium: 10.2 mg/dL (ref 8.4–10.5)
GFR calc non Af Amer: >90 mL/min (ref >90)
Glucose, Bld: 118 mg/dL — ABNORMAL HIGH (ref 70–99)

## 2011-06-14 MED ORDER — SODIUM CHLORIDE 0.9 % IV BOLUS (SEPSIS)
1000.0000 mL | Freq: Once | INTRAVENOUS | Status: AC
  Administered 2011-06-14: 1000 mL via INTRAVENOUS

## 2011-06-14 NOTE — ED Provider Notes (Signed)
History     CSN: [id number] Arrival date & time: 06/14/2011 11:22 AM   First MD Initiated Contact with Patient 06/14/11 1248      Chief Complaint  Patient presents with  . Facial Droop    (Consider location/radiation/quality/duration/timing/severity/associated sxs/prior treatment) Patient is a 60 y.o. male presenting with weakness. The history is provided by the patient.  Weakness The primary symptoms include focal weakness and speech change. Primary symptoms do not include headaches, loss of consciousness, altered mental status, seizures, dizziness, visual change, loss of sensation, memory loss, fever, nausea or vomiting. The symptoms began yesterday. The symptoms are unchanged. The neurological symptoms are focal.  Additional symptoms include weakness. Additional symptoms do not include neck stiffness, pain, lower back pain, leg pain, loss of balance, photophobia, aura, hallucinations, nystagmus, taste disturbance, hyperacusis, hearing loss, tinnitus, vertigo, anxiety, irritability or dysphoric mood. Medical issues do not include cerebral vascular accident.   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 He presents with a chief complaint of L sided facial droop which started when he woke up on Sunday morning. He denies any associated headache, visual change, weakness, numbness. He is able to move his arms as normal. He has not noted any changes in his speech. He denies ever having had a stroke in the past, but does have a PMH of CAD.  Past Medical History  Diagnosis Date  . Coronary artery disease     History reviewed. No pertinent past surgical history.  Family History  Problem Relation Age of Onset  . Heart failure Mother   . Heart failure Father     History  Substance Use Topics  . Smoking status: Never Smoker   . Smokeless tobacco: Not on file  . Alcohol Use: No      Review of Systems  Constitutional: Negative for fever,  activity change, appetite change and irritability.  HENT: Negative for hearing loss, ear pain, facial swelling, drooling, trouble swallowing, neck pain, neck stiffness, voice change, tinnitus and ear discharge.   Eyes: Negative for photophobia and visual disturbance.  Respiratory: Negative for cough, chest tightness and shortness of breath.   Cardiovascular: Negative for chest pain and palpitations.  Gastrointestinal: Negative for nausea, vomiting, abdominal pain, constipation and abdominal distention.  Musculoskeletal: Negative for myalgias.  Skin: Negative for color change and rash.  Neurological: Positive for speech change, focal weakness, facial asymmetry and weakness. Negative for dizziness, vertigo, seizures, loss of consciousness, speech difficulty, light-headedness, numbness, headaches and loss of balance.  Psychiatric/Behavioral: Negative for hallucinations, memory loss, dysphoric mood and altered mental status.    Allergies  Ciprofloxacin in d5w and Minocycline hcl  Home Medications   Current Outpatient Rx  Name Route Sig Dispense Refill  . ACETAMINOPHEN 325 MG PO TABS Oral Take 650 mg by mouth every 6 (six) hours as needed. For pain     . AMLODIPINE BESYLATE 2.5 MG PO TABS Oral Take 7.5 mg by mouth daily.      . ASPIRIN 81 MG PO CHEW Oral Chew 81 mg by mouth daily.      Marland Kitchen BACLOFEN 10 MG PO TABS Oral Take 10 mg by mouth 3 (three) times daily.      Marland Kitchen DIVALPROEX SODIUM 250 MG PO TBEC Oral Take 250 mg by mouth 2 (two) times daily. Take along with depakote 500mg      . DIVALPROEX SODIUM 500 MG PO TBEC Oral Take 500 mg by mouth 2 (two) times  daily. Take with Depakote 250mg      . FERROUS SULFATE 325 (65 FE) MG PO TABS Oral Take 325 mg by mouth 2 (two) times daily.      Marland Kitchen FLUOXETINE HCL 20 MG PO CAPS Oral Take 20 mg by mouth daily. Along with Prozac 40mg      . FLUOXETINE HCL 40 MG PO CAPS Oral Take 40 mg by mouth 2 (two) times daily. Along with Prozac 20mg      . HYDROCHLOROTHIAZIDE 25  MG PO TABS Oral Take 25 mg by mouth daily.      Marland Kitchen HYDROCODONE-ACETAMINOPHEN 10-325 MG PO TABS Oral Take 1 tablet by mouth every 4 (four) hours as needed. For pain     . HYDROXYZINE HCL 25 MG PO TABS Oral Take 25 mg by mouth 2 (two) times daily.      Marland Kitchen LANSOPRAZOLE 30 MG PO CPDR Oral Take 30 mg by mouth daily.      Marland Kitchen LEVETIRACETAM 1000 MG PO TABS Oral Take 1,000 mg by mouth 2 (two) times daily.      . CERTAGEN PO Oral Take 1 tablet by mouth daily.      Marland Kitchen ZOLPIDEM TARTRATE 5 MG PO TABS Oral Take 5 mg by mouth at bedtime as needed.        BP 120/58  Pulse 80  Temp(Src) 99.1 F (37.3 C) (Oral)  Resp 24  Ht 6\' 1"  (1.854 m)  Wt 244 lb (110.678 kg)  BMI 32.19 kg/m2  SpO2 95%  Physical Exam  Constitutional: He is oriented to person, place, and time. He appears well-developed and well-nourished. No distress.       Large body habitus  HENT:  Head: Normocephalic.  Right Ear: External ear normal.  Left Ear: External ear normal.  Nose: Nose normal.  Mouth/Throat: Oropharynx is clear and moist.       Droop to L mouth and eyelid noted.  Eyes: Conjunctivae and EOM are normal. Pupils are equal, round, and reactive to light. Right eye exhibits no discharge. Left eye exhibits no discharge.  Neck: Normal range of motion. Neck supple. No tracheal deviation present. No thyromegaly present.  Cardiovascular: Normal rate, regular rhythm and normal heart sounds.  Exam reveals no gallop and no friction rub.   No murmur heard. Pulmonary/Chest: Effort normal and breath sounds normal. No respiratory distress. He has no wheezes.  Abdominal: Soft. Bowel sounds are normal. He exhibits no distension. There is no tenderness. There is no rebound.  Musculoskeletal:       Normal ROM of upper extremities  Neurological: He is alert and oriented to person, place, and time. He has normal strength and normal reflexes. No sensory deficit. GCS eye subscore is 4. GCS verbal subscore is 5. GCS motor subscore is 6.  Reflex  Scores:      Tricep reflexes are 2+ on the right side and 2+ on the left side.      Bicep reflexes are 2+ on the right side and 2+ on the left side.      Good grip strength. Full muscle strength in arms b/l. Unable to test LEs 2/2 paraplegia. He has L facial droop, and is unable to elevate the L midface with smiling/frowning. No weakness noted with wrinking forehead b/l. He is mildly dysarthric but this may be 2/2 lack of dentures or mouth droop. No other CN deficits noted.  Skin: Skin is warm and dry. No rash noted. He is not diaphoretic.       Sacral decubitus  ulcer, skin of LEs appears mildly macerated  Psychiatric: He has a normal mood and affect. His behavior is normal. Thought content normal.    ED Course  Procedures (including critical care time)  Labs Reviewed  CBC - Abnormal; Notable for the following:    RBC 4.03 (*)    Hemoglobin 9.7 (*)    HCT 32.3 (*)    MCH 24.1 (*)    RDW 16.2 (*)    All other components within normal limits  BASIC METABOLIC PANEL - Abnormal; Notable for the following:    Chloride 91 (*)    CO2 43 (*)    Glucose, Bld 118 (*)    All other components within normal limits   Ct Head Wo Contrast  06/14/2011  *RADIOLOGY REPORT*  Clinical Data: Right facial droop, slurred speech  CT HEAD WITHOUT CONTRAST  Technique:  Contiguous axial images were obtained from the base of the skull through the vertex without contrast.  Comparison: None.  Findings: No acute intracranial hemorrhage.  No focal mass lesion. No CT evidence of acute infarction.   No midline shift or mass effect.  No hydrocephalus.  Basilar cisterns are patent. Paranasal sinuses and mastoid air cells are clear.  Orbits are normal.  IMPRESSION: No acute intercranial findings.  No CT evidence of acute infarction.  Original Report Authenticated By: Genevive Bi, M.D.    Date: 06/14/2011  Rate: 86 bpm  Rhythm: normal sinus rhythm  QRS Axis: normal  Intervals: normal  ST/T Wave abnormalities: normal   Conduction Disutrbances:none  Narrative Interpretation:   Old EKG Reviewed: unchanged *Computer ECG interpretation indicates "accelerated junctional rhythm," but this appears to be a "noisy" ECG which is probably leading to that interpretation.   No diagnosis found.  3:58 PM Patient seen and evaluated with Dr. Effie Shy. CT was negative for acute pathology. Patient is able to wrinkle his forehead, but has L mid face weakness. He is mildly dysarthric, but apparently has some dysarthria at baseline. He can close his eye. He does not have any other focal cranial nerve deficits. This may be a bells palsy versus possible stroke pathology. Consult called to neurology for further evaluation and management. The patient's carbon dioxide was also high on his metabolic panel. ABG ordered to further evaluate. However, chart review indicates that he's had similar values in the past - this may be due to his body habitus.  4:51 PM Talked with Dr. Thad Ranger - she recommended MR of the brain. Will obtain. Patient is safe to return to facility if this is nl. He will be moved to CDU while awaiting this test. Report given to [facility], [place] who will disposition as appropriate.  MDM    Grant Fontana, PA-C   Patient resumed from [facility]. I have been informed that the patient is unable to receive an MR due to body habitus. I have re\re paged Dr. Thad Ranger from neurology to see what her plan is. We will either be admitting the patient for observation and a possible stroke or return the patient back to his facility. Of note the patient's colostomy bag is being changed Due 2 it being full. Otherwise the patient currently has no complaints he is hemodynamically stable.  Lisette Paz, PA-C (Epic is malfunctioning and will not let me create male note on this patient therefore I am unable being Ms. Mayford Knife is no for the time being until the spoke problem is resolved.)  9:56 PM  Spoke with Neurology again in regards  to the  pt not being able to do the MRI. Dr. Richardean Chimera recommends  that we r/o stroke with a CT angio of the head and neck. Results pending.   7:57 AM Pt seen and examined by me in CDU. Awaiting results read by neuro radiology on pt's CT scan. Pt in NAD. Eating breakfast. Asymptomatic.  Lottie Mussel, PA 06/15/11 0758  8:55 AM CTA head and neck negative. Will run by  Neurology. Will d/c home with follow up. Pt with a large sacral decubitus ulcer, asking for pain medications. Vicodin ordered.  8:59 AM  Spoke with Dr. Thad Ranger. Recommended increasing aspiring to 325mg  daily. Also to follow up with neurology for an outpatient MRI.  Lottie Mussel, PA 06/15/11 0900

## 2011-06-14 NOTE — ED Notes (Signed)
Pt with c/o of colonostomy bag being full and needing changing.  Bag ordered from [employer] and pt cleaned and new bag placed.  Site without breakdown or irritation.  Stoma site WNL.  No foul drainage noted.

## 2011-06-14 NOTE — ED Provider Notes (Signed)
Medical screening examination/treatment/procedure(s) were conducted as a shared visit with non-physician practitioner(s) and myself.  I personally evaluated the patient during the encounter. Patient with left facial weakness and left facial pain with trouble talking since yesterday. He denies headache or upper extremity weakness. He has a mid back level, paralysis, causing him to be bedbound. On exam, there is dysarthria and left facial weakness. He is able to completely close the left eye but cannot elevate the left midface appeared is no apparent foreign weakness. He is dysarthric, but is edentulous. There is no other overt cranial nerve deficit.  Flint Melter, MD 06/14/11 [phone]

## 2011-06-14 NOTE — ED Notes (Signed)
Patient is resting comfortably. Pt snoring, no c/o pain or discomfort

## 2011-06-14 NOTE — ED Notes (Signed)
Pt to MRI via stretcher.

## 2011-06-14 NOTE — ED Notes (Signed)
Received patient and assumed care, pt presents with right side facial drooping, pt speaking in full sentence, denies CP, SOB,  numbness and tingling sensation, pt has colostomy bag that's active, and pt has sacral ulcer

## 2011-06-14 NOTE — ED Notes (Signed)
Received bedside report from [place], Charity fundraiser.  Patient currently in CT.  Will continue to monitor.

## 2011-06-15 MED ORDER — HYDROCODONE-ACETAMINOPHEN 5-325 MG PO TABS
1.0000 | ORAL_TABLET | Freq: Once | ORAL | Status: AC
  Administered 2011-06-15: 1 via ORAL

## 2011-06-15 MED ORDER — IOHEXOL 350 MG/ML SOLN
100.0000 mL | Freq: Once | INTRAVENOUS | Status: AC | PRN
  Administered 2011-06-15: 100 mL via INTRAVENOUS

## 2011-06-15 NOTE — ED Notes (Signed)
Breakfast tray delivered to patient

## 2011-06-15 NOTE — ED Notes (Signed)
Pt. Resting quietly, pt. Is alert and oriented X3, ate 50 of breakfast. Pt. Turned and repositioned for comfort and to decrease pain on buttocks.

## 2011-06-15 NOTE — ED Notes (Signed)
Patient currently resting quietly in bed; no respiratory or acute distress. Respirations 18 per minute; unlabored.  Will continue to monitor.

## 2011-06-15 NOTE — ED Notes (Signed)
Patient currently resting comfortably in bed; no respiratory or acute distress noted.  Patient has no questions, concerns, complaints, or requests at this time.  Will continue to monitor.  

## 2011-06-15 NOTE — ED Notes (Signed)
Patient back from CT.  Patient currently sitting up in bed; no respiratory or acute distress noted.  Patient has no questions or concerns at this time.  Updated patient on plan of care; informed patient that we are waiting for CT results to come back.  Will continue to monitor.

## 2011-06-15 NOTE — ED Notes (Signed)
Patient currently asleep in bed; no respiratory or acute distress noted.  Respirations 20 per minute; unlabored.  Will continue to monitor.

## 2011-06-15 NOTE — ED Notes (Signed)
Patient currently asleep; no respiratory or acute distress noted.  Respirations 18 per minute; unlabored.  Will continue to monitor.

## 2011-06-15 NOTE — ED Provider Notes (Signed)
Medical screening examination/treatment/procedure(s) were performed by non-physician practitioner and as supervising physician I was immediately available for consultation/collaboration.  Sahej Schrieber L Matias Thurman, MD 06/15/11 2047 

## 2011-06-15 NOTE — ED Notes (Signed)
Report given to [facility], Charity fundraiser at [facility], pt. Stable upon discharge

## 2011-06-15 NOTE — ED Notes (Signed)
Patient currently asleep in bed; no respiratory or acute distress noted.  Will continue to monitor. 

## 2011-06-15 NOTE — ED Notes (Signed)
Patient currently asleep in bed; no respiratory or acute distress noted.  Family member present at bedside.  Will continue to monitor.

## 2012-07-12 ENCOUNTER — Encounter (HOSPITAL_COMMUNITY): Payer: Self-pay

## 2012-07-12 ENCOUNTER — Emergency Department (HOSPITAL_COMMUNITY): Payer: PRIVATE HEALTH INSURANCE

## 2012-07-12 ENCOUNTER — Inpatient Hospital Stay (HOSPITAL_COMMUNITY)
Admission: EM | Admit: 2012-07-12 | Discharge: 2012-07-19 | DRG: 871 | Disposition: A | Discharge status: Other Acute Inpatient Hospital | Payer: PRIVATE HEALTH INSURANCE | Attending: Internal Medicine | Admitting: Internal Medicine

## 2012-07-12 DIAGNOSIS — F329 Major depressive disorder, single episode, unspecified: Secondary | ICD-10-CM | POA: Diagnosis present

## 2012-07-12 DIAGNOSIS — D649 Anemia, unspecified: Secondary | ICD-10-CM

## 2012-07-12 DIAGNOSIS — A419 Sepsis, unspecified organism: Principal | ICD-10-CM | POA: Insufficient documentation

## 2012-07-12 DIAGNOSIS — Z66 Do not resuscitate: Secondary | ICD-10-CM | POA: Diagnosis present

## 2012-07-12 DIAGNOSIS — I5032 Chronic diastolic (congestive) heart failure: Secondary | ICD-10-CM | POA: Diagnosis present

## 2012-07-12 DIAGNOSIS — G40909 Epilepsy, unspecified, not intractable, without status epilepticus: Secondary | ICD-10-CM | POA: Diagnosis present

## 2012-07-12 DIAGNOSIS — L8994 Pressure ulcer of unspecified site, stage 4: Secondary | ICD-10-CM | POA: Diagnosis present

## 2012-07-12 DIAGNOSIS — L89159 Pressure ulcer of sacral region, unspecified stage: Secondary | ICD-10-CM | POA: Diagnosis present

## 2012-07-12 DIAGNOSIS — Z8679 Personal history of other diseases of the circulatory system: Secondary | ICD-10-CM

## 2012-07-12 DIAGNOSIS — N318 Other neuromuscular dysfunction of bladder: Secondary | ICD-10-CM | POA: Diagnosis present

## 2012-07-12 DIAGNOSIS — E875 Hyperkalemia: Secondary | ICD-10-CM | POA: Diagnosis present

## 2012-07-12 DIAGNOSIS — R6521 Severe sepsis with septic shock: Secondary | ICD-10-CM

## 2012-07-12 DIAGNOSIS — G8220 Paraplegia, unspecified: Secondary | ICD-10-CM | POA: Diagnosis present

## 2012-07-12 DIAGNOSIS — L89899 Pressure ulcer of other site, unspecified stage: Secondary | ICD-10-CM | POA: Diagnosis present

## 2012-07-12 DIAGNOSIS — G9340 Encephalopathy, unspecified: Secondary | ICD-10-CM | POA: Diagnosis present

## 2012-07-12 DIAGNOSIS — N179 Acute kidney failure, unspecified: Secondary | ICD-10-CM | POA: Diagnosis present

## 2012-07-12 DIAGNOSIS — Z933 Colostomy status: Secondary | ICD-10-CM

## 2012-07-12 DIAGNOSIS — J90 Pleural effusion, not elsewhere classified: Secondary | ICD-10-CM | POA: Diagnosis present

## 2012-07-12 DIAGNOSIS — I509 Heart failure, unspecified: Secondary | ICD-10-CM | POA: Diagnosis present

## 2012-07-12 DIAGNOSIS — I959 Hypotension, unspecified: Secondary | ICD-10-CM | POA: Diagnosis present

## 2012-07-12 DIAGNOSIS — F3289 Other specified depressive episodes: Secondary | ICD-10-CM | POA: Diagnosis present

## 2012-07-12 DIAGNOSIS — J9600 Acute respiratory failure, unspecified whether with hypoxia or hypercapnia: Secondary | ICD-10-CM

## 2012-07-12 DIAGNOSIS — J189 Pneumonia, unspecified organism: Secondary | ICD-10-CM | POA: Diagnosis present

## 2012-07-12 DIAGNOSIS — R4182 Altered mental status, unspecified: Secondary | ICD-10-CM

## 2012-07-12 DIAGNOSIS — D509 Iron deficiency anemia, unspecified: Secondary | ICD-10-CM | POA: Diagnosis present

## 2012-07-12 HISTORY — DX: Unspecified convulsions: R56.9

## 2012-07-12 HISTORY — DX: Heart failure, unspecified: I50.9

## 2012-07-12 HISTORY — DX: Gastro-esophageal reflux disease without esophagitis: K21.9

## 2012-07-12 HISTORY — DX: Paraplegia, unspecified: G82.20

## 2012-07-12 LAB — POCT I-STAT 3, ART BLOOD GAS (G3+)
Acid-Base Excess: 3 mmol/L — ABNORMAL HIGH (ref 0.0–2.0)
Bicarbonate: 30.3 mEq/L — ABNORMAL HIGH (ref 20.0–24.0)
Patient temperature: 98.6
pH, Arterial: 7.306 — ABNORMAL LOW (ref 7.350–7.450)

## 2012-07-12 LAB — CBC WITH DIFFERENTIAL/PLATELET
Hemoglobin: 10 g/dL — ABNORMAL LOW (ref 13.0–17.0)
Lymphocytes Relative: 9 % — ABNORMAL LOW (ref 12–46)
Lymphs Abs: 1.4 10*3/uL (ref 0.7–4.0)
MCH: 24.3 pg — ABNORMAL LOW (ref 26.0–34.0)
Monocytes Relative: 8 % (ref 3–12)
Neutro Abs: 12.7 10*3/uL — ABNORMAL HIGH (ref 1.7–7.7)
Neutrophils Relative %: 82 % — ABNORMAL HIGH (ref 43–77)
Platelets: 541 10*3/uL — ABNORMAL HIGH (ref 150–400)
RBC: 4.11 MIL/uL — ABNORMAL LOW (ref 4.22–5.81)
WBC: 15.4 10*3/uL — ABNORMAL HIGH (ref 4.0–10.5)

## 2012-07-12 LAB — COMPREHENSIVE METABOLIC PANEL
AST: 17 U/L (ref 0–37)
BUN: 37 mg/dL — ABNORMAL HIGH (ref 6–23)
BUN: 37 mg/dL — ABNORMAL HIGH (ref 6–23)
CO2: 26 mEq/L (ref 19–32)
CO2: 29 mEq/L (ref 19–32)
Calcium: 8.3 mg/dL — ABNORMAL LOW (ref 8.4–10.5)
Chloride: 94 mEq/L — ABNORMAL LOW (ref 96–112)
Chloride: 100 mEq/L (ref 96–112)
Creatinine, Ser: 1.47 mg/dL — ABNORMAL HIGH (ref 0.50–1.35)
GFR calc Af Amer: 64 mL/min — ABNORMAL LOW (ref >90)
GFR calc Af Amer: 58 mL/min — ABNORMAL LOW (ref >90)
GFR calc non Af Amer: 50 mL/min — ABNORMAL LOW (ref >90)
Glucose, Bld: 123 mg/dL — ABNORMAL HIGH (ref 70–99)
Glucose, Bld: 112 mg/dL — ABNORMAL HIGH (ref 70–99)
Sodium: 134 mEq/L — ABNORMAL LOW (ref 135–145)
Total Bilirubin: 0.3 mg/dL (ref 0.3–1.2)
Total Bilirubin: 0.2 mg/dL — ABNORMAL LOW (ref 0.3–1.2)

## 2012-07-12 LAB — URINALYSIS, ROUTINE W REFLEX MICROSCOPIC
Glucose, UA: 100 mg/dL — AB
Hgb urine dipstick: NEGATIVE
Ketones, ur: NEGATIVE mg/dL
Protein, ur: >300 mg/dL — AB
Urobilinogen, UA: 0.2 mg/dL (ref 0.0–1.0)

## 2012-07-12 LAB — URINE MICROSCOPIC-ADD ON

## 2012-07-12 LAB — VALPROIC ACID LEVEL: Valproic Acid Lvl: 35.5 ug/mL — ABNORMAL LOW (ref 50.0–100.0)

## 2012-07-12 LAB — CG4 I-STAT (LACTIC ACID): Lactic Acid, Venous: 1.2 mmol/L (ref 0.5–2.2)

## 2012-07-12 LAB — MRSA PCR SCREENING: MRSA by PCR: NEGATIVE

## 2012-07-12 MED ORDER — VANCOMYCIN HCL 10 G IV SOLR
1500.0000 mg | Freq: Once | INTRAVENOUS | Status: AC
  Administered 2012-07-12: 1500 mg via INTRAVENOUS
  Filled 2012-07-12 (×2): qty 1500

## 2012-07-12 MED ORDER — NALOXONE HCL 1 MG/ML IJ SOLN
2.0000 mg | Freq: Once | INTRAMUSCULAR | Status: AC
  Administered 2012-07-12: 2 mg via INTRAVENOUS
  Filled 2012-07-12: qty 2

## 2012-07-12 MED ORDER — PIPERACILLIN-TAZOBACTAM 3.375 G IVPB
3.3750 g | Freq: Three times a day (TID) | INTRAVENOUS | Status: DC
  Filled 2012-07-12 (×3): qty 50

## 2012-07-12 MED ORDER — SODIUM CHLORIDE 0.9 % IV SOLN
INTRAVENOUS | Status: DC
  Administered 2012-07-12: 125 mL via INTRAVENOUS

## 2012-07-12 MED ORDER — SODIUM CHLORIDE 0.9 % IV BOLUS (SEPSIS)
500.0000 mL | Freq: Once | INTRAVENOUS | Status: AC
  Administered 2012-07-12: 500 mL via INTRAVENOUS

## 2012-07-12 MED ORDER — ENOXAPARIN SODIUM 40 MG/0.4ML ~~LOC~~ SOLN
40.0000 mg | SUBCUTANEOUS | Status: DC
  Administered 2012-07-12 – 2012-07-18 (×7): 40 mg via SUBCUTANEOUS
  Filled 2012-07-12 (×8): qty 0.4

## 2012-07-12 MED ORDER — ONDANSETRON HCL 4 MG/2ML IJ SOLN
4.0000 mg | Freq: Once | INTRAMUSCULAR | Status: AC
  Administered 2012-07-12: 4 mg via INTRAVENOUS
  Filled 2012-07-12: qty 2

## 2012-07-12 MED ORDER — CHLORHEXIDINE GLUCONATE 0.12 % MT SOLN
15.0000 mL | Freq: Two times a day (BID) | OROMUCOSAL | Status: DC
  Administered 2012-07-12 – 2012-07-19 (×13): 15 mL via OROMUCOSAL
  Filled 2012-07-12 (×16): qty 15

## 2012-07-12 MED ORDER — SODIUM CHLORIDE 0.9 % IV SOLN
INTRAVENOUS | Status: DC
  Administered 2012-07-12: 13:00:00 via INTRAVENOUS
  Administered 2012-07-13 (×2): 125 mL via INTRAVENOUS
  Administered 2012-07-14: 125 mL/h via INTRAVENOUS

## 2012-07-12 MED ORDER — SODIUM CHLORIDE 0.9 % IV BOLUS (SEPSIS)
1000.0000 mL | Freq: Once | INTRAVENOUS | Status: AC
  Administered 2012-07-12: 1000 mL via INTRAVENOUS

## 2012-07-12 MED ORDER — VANCOMYCIN HCL IN DEXTROSE 1-5 GM/200ML-% IV SOLN
1000.0000 mg | Freq: Two times a day (BID) | INTRAVENOUS | Status: DC
  Administered 2012-07-13 – 2012-07-14 (×4): 1000 mg via INTRAVENOUS
  Filled 2012-07-12 (×4): qty 200

## 2012-07-12 MED ORDER — SODIUM CHLORIDE 0.9 % IV SOLN
1000.0000 mg | Freq: Two times a day (BID) | INTRAVENOUS | Status: DC
  Administered 2012-07-12 – 2012-07-13 (×2): 1000 mg via INTRAVENOUS
  Filled 2012-07-12 (×3): qty 10

## 2012-07-12 MED ORDER — VANCOMYCIN HCL IN DEXTROSE 1-5 GM/200ML-% IV SOLN
1000.0000 mg | Freq: Once | INTRAVENOUS | Status: DC
  Filled 2012-07-12: qty 200

## 2012-07-12 MED ORDER — LEVOFLOXACIN IN D5W 750 MG/150ML IV SOLN
750.0000 mg | INTRAVENOUS | Status: DC
  Administered 2012-07-12 – 2012-07-13 (×2): 750 mg via INTRAVENOUS
  Filled 2012-07-12 (×2): qty 150

## 2012-07-12 MED ORDER — PIPERACILLIN-TAZOBACTAM 3.375 G IVPB: 3.3750 g | Freq: Three times a day (TID) | INTRAVENOUS | Status: DC

## 2012-07-12 MED ORDER — BIOTENE DRY MOUTH MT LIQD
15.0000 mL | Freq: Two times a day (BID) | OROMUCOSAL | Status: DC
  Administered 2012-07-13 – 2012-07-18 (×12): 15 mL via OROMUCOSAL

## 2012-07-12 MED ORDER — PIPERACILLIN-TAZOBACTAM 3.375 G IVPB
3.3750 g | Freq: Three times a day (TID) | INTRAVENOUS | Status: DC
  Administered 2012-07-12 – 2012-07-17 (×15): 3.375 g via INTRAVENOUS
  Filled 2012-07-12 (×18): qty 50

## 2012-07-12 MED ORDER — PIPERACILLIN-TAZOBACTAM 3.375 G IVPB
3.3750 g | Freq: Once | INTRAVENOUS | Status: AC
  Administered 2012-07-12: 3.375 g via INTRAVENOUS
  Filled 2012-07-12: qty 50

## 2012-07-12 NOTE — ED Notes (Signed)
Xray to bedside.

## 2012-07-12 NOTE — ED Notes (Signed)
RT and lab at bedside.

## 2012-07-12 NOTE — ED Notes (Signed)
Rolled pt toward rt side.  Pillows placed under pt's left side.

## 2012-07-12 NOTE — ED Notes (Signed)
Vanc ordered from pharmacy

## 2012-07-12 NOTE — Progress Notes (Signed)
ANTIBIOTIC CONSULT NOTE - INITIAL  Pharmacy Consult for Vancomycin, Zosyn Indication: pneumonia  Allergies  Allergen Reactions  . Ciprofloxacin In D5w Itching  . Minocycline Hcl Itching    Patient Measurements: Weight = 111 kg   Labs:  Basename 07/12/12 1209  WBC 15.4*  HGB 10.0*  PLT 541*  LABCREA --  CREATININE 1.35   The CrCl is unknown because both a height and weight (above a minimum accepted value) are required for this calculation. No results found for this basename: VANCOTROUGH:2,VANCOPEAK:2,VANCORANDOM:2,GENTTROUGH:2,GENTPEAK:2,GENTRANDOM:2,TOBRATROUGH:2,TOBRAPEAK:2,TOBRARND:2,AMIKACINPEAK:2,AMIKACINTROU:2,AMIKACIN:2, in the last 72 hours   Microbiology: No results found for this or any previous visit (from the past 720 hour(s)).  Medical History: Past Medical History  Diagnosis Date  . Coronary artery disease   . CHF (congestive heart failure)   . Paraplegia   . GERD (gastroesophageal reflux disease)   . Seizure    Assessment: 61 year old with a history of paraplegia, CAD, CHF, and seizures beginning empiric antibiotics for pneumonia  CrCl = 80 ml/min  Goal of Therapy:  Vancomycin trough level 15-20 mcg/ml Appropriate Zosyn dosing  Plan:  1) Zosyn 3.375 grams iv Q 8 hours (4 hr infusion) 2) Vancomycin 1500 mg iv X 1 now (received 1 G , then 1 G iv Q 12 hrs 3) Follow up Scr, cultures, plan  Thank you. Okey Regal, PharmD [phone]  07/12/2012,2:23 PM

## 2012-07-12 NOTE — Progress Notes (Signed)
Placed pt. On non-rebreather with MD permission. Pt. Does understand that if his oxygen level goes down, he may have to go back on bipap.

## 2012-07-12 NOTE — ED Notes (Signed)
Report called to CN on 2600.  CN will call when specialty bed available.

## 2012-07-12 NOTE — ED Notes (Signed)
Pt rolled off of buttock area to left side.

## 2012-07-12 NOTE — ED Notes (Signed)
vanc ordered from pharmacy

## 2012-07-12 NOTE — ED Notes (Signed)
MD at bedside. 

## 2012-07-12 NOTE — ED Notes (Signed)
Lactic acid istat results given to Dr. Freida Busman.

## 2012-07-12 NOTE — H&P (Addendum)
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 Patient was alert and oriented this am and complaining of cough . Around 11:30 am he was poorly responsive and lethargic and shaky. BP was 90/70  Patient at baseline is  Well alert and oriented and able to communicate well. He is immobile however is able to turn in bed. As per the nurse he was started on Zyvox and Augmentin of 2 weeks back for possible infection over the coccyx and is still on it. He however did not have any fever. Patient was then transferred to [facility]. In the ED he was placed on BiPAP with a ABG showing hypercapnic respiratory failure. His blood pressure was 72/32 mmhg. her chest x-ray done showed hazy opacification of  left hemithorax. [facility] and was called for admission to ICU. However given his multiple comorbidities, PC CM and discussed with patient's sister Maureen Ralphs who recommended to make him DO NOT RESUSCITATE and possible comfort care. Patient was given a dose of IV vancomycin and Zosyn in the ED along with IV fluids and recommended to be admited to step down unit under [facility]. Patient did not complain of any chest pain, shortness of breath, palpitations, abdominal pain, headache, nausea or vomiting. He did not have any diarrhea fever or chills.  Review of  Systems:  Limited and as outlined in HPI. Patient poorly responsive at this time.    Past Medical History  Diagnosis Date  . Coronary artery disease   . CHF (congestive heart failure)   . Paraplegia   . GERD (gastroesophageal reflux disease)   . Seizure    History reviewed. No pertinent past surgical history. Social History:  reports that he has never smoked. He does not have any smokeless tobacco history on file. He reports that he does not drink alcohol or use illicit drugs.  Allergies  Allergen Reactions  . Ciprofloxacin In D5w Itching  . Minocycline Hcl Itching    Family History  Problem Relation Age of Onset  . Heart failure Mother   . Heart failure Father     Prior to Admission medications   Medication Sig Start Date End Date Taking? Authorizing Provider  acetaminophen (TYLENOL) 325 MG tablet Take 325-650 mg by mouth every 6 (six) hours as needed. Take 325mg  once daily.  Take 650mg  every 6 hours as needed for pain.   Yes Historical Provider, MD  amLODipine (NORVASC) 10 MG tablet Take 10 mg by mouth daily.   Yes Historical Provider, MD  azithromycin (ZITHROMAX) 250 MG tablet Take 250-500 mg by mouth daily. Take 500mg  for 1 day, then take 250mg  every day for 4 days.  First dose 07/10/2012.   Yes Historical Provider, MD  baclofen (LIORESAL) 10 MG tablet Take 10 mg by mouth 4 (four) times daily.   Yes Historical Provider, MD  divalproex (DEPAKOTE) 250 MG DR tablet Take 250 mg by mouth daily. Take along  with one Depakote 500mg  DR tablet to equal a dose of 750mg .   Yes Historical Provider, MD  divalproex (DEPAKOTE) 500 MG DR tablet Take 500-1,000 mg by mouth 2 (two) times daily. Take 500mg  along with 250mg  (to equal 750mg  dose) every morning and take 1,000mg  every night at bedtime.   Yes Historical Provider, MD  enoxaparin (LOVENOX) 40 MG/0.4ML injection Inject 40 mg into the skin daily.   Yes Historical Provider, MD  FeFum-FePoly-FA-B Cmp-C-Biot (INTEGRA PLUS PO) Take 1 capsule by  mouth every morning.   Yes Historical Provider, MD  FLUoxetine (PROZAC) 20 MG capsule Take 60 mg by mouth 2 (two) times daily.   Yes Historical Provider, MD  fluticasone (FLONASE) 50 MCG/ACT nasal spray Place 2 sprays into the nose at bedtime. Use for 1 month.  Final dose due 08/12/2012.   Yes Historical Provider, MD  guaiFENesin (MUCINEX) 600 MG 12 hr tablet Take 600 mg by mouth 2 (two) times daily. Take for 10 days.  Last dose due on the morning of 07/20/2012.   Yes Historical Provider, MD  hydrochlorothiazide (HYDRODIURIL) 25 MG tablet Take 25 mg by mouth daily.     Yes Historical Provider, MD  HYDROcodone-acetaminophen (NORCO) 10-325 MG per tablet Take 1 tablet by mouth every 4 (four) hours as needed. For pain    Yes Historical Provider, MD  iron polysaccharides (NIFEREX) 150 MG capsule Take 150 mg by mouth daily.   Yes Historical Provider, MD  lansoprazole (PREVACID) 30 MG capsule Take 30 mg by mouth 2 (two) times daily.    Yes Historical Provider, MD  latanoprost (XALATAN) 0.005 % ophthalmic solution Place 1 drop into both eyes at bedtime.   Yes Historical Provider, MD  levETIRAcetam (KEPPRA) 1000 MG tablet Take 1,000 mg by mouth 2 (two) times daily.     Yes Historical Provider, MD  lisinopril (PRINIVIL,ZESTRIL) 10 MG tablet Take 10 mg by mouth daily.   Yes Historical Provider, MD  oxyCODONE (OXY IR/ROXICODONE) 5 MG immediate release tablet Take 5 mg by mouth every 6 (six) hours as needed. For pain   Yes Historical Provider, MD  saccharomyces boulardii (FLORASTOR) 250 MG capsule Take 250 mg by mouth 2 (two) times daily. Take for 7 days.  Final dose due on the morning of 07/17/2012.   Yes Historical Provider, MD  tolterodine (DETROL) 2 MG tablet Take 2 mg by mouth at bedtime.   Yes Historical Provider, MD  zolpidem (AMBIEN) 5 MG tablet Take 5 mg by mouth at bedtime as needed. For sleep   Yes Historical Provider, MD    Physical Exam:  Filed Vitals:   07/12/12 1226 07/12/12 1315 07/12/12 1400  07/12/12 1436  BP: 72/32 78/48 76/49  80/42  Pulse: 80 74 65 65  Temp: 97.9 F (36.6 C)   97.6 F (36.4 C)  TempSrc: Rectal   Axillary  Resp: 19 22 17 18   SpO2: 93% 89% 92% 95%    Constitutional: Vital signs reviewed.  Patient is an elderly obese male lying in bed on BiPAP responsive only to physical stimuli with eye opening  Head: Normocephalic and atraumatic Ear:  Mouth: Not examined due to BiPAP Eyes: PERRL, EOMI, conjunctivae normal, No scleral icterus.  Neck: Supple, Trachea midline normal ROM, No JVD, mass, thyromegaly, or carotid bruit present.  Cardiovascular: Heart Sounds distant, no MRG, pulses symmetric and intact bilaterally Pulmonary/Chest: Minimal breath sounds over left side, no rhonchi or crackles appreciated Abdominal: Soft. Non-tender, non-distended, bowel sounds are normal, suprapubic catheter in place with  minimal urine output . Colostomy bag in place Extremity: lower  extremity paraplegic. Has stage 4  sacral ulcer . No Edema  Hematology: no cervical, inginal, or axillary adenopathy.  Neurological: A&O x0, paraplegic   Labs on Admission:  Basic Metabolic Panel:  Lab 07/12/12 8295 07/12/12 1209  NA 139 134*  K 5.7* NOT DONE  CL 100 94*  CO2 29 26  GLUCOSE 112* 123*  BUN 37* 37*  CREATININE 1.47* 1.35  CALCIUM 8.3* 8.9  MG -- --  PHOS -- --   Liver Function Tests:  Lab 07/12/12 1335 07/12/12 1209  AST 17 80*  ALT 29 NOT DONE  ALKPHOS 152* NOT DONE  BILITOT 0.2* 0.3  PROT 7.3 8.2  ALBUMIN 2.1* 2.2*   No results found for this basename: LIPASE:5,AMYLASE:5 in the last 168 hours No results found for this basename: AMMONIA:5 in the last 168 hours CBC:  Lab 07/12/12 1209  WBC 15.4*  NEUTROABS 12.7*  HGB 10.0*  HCT 33.1*  MCV 80.5  PLT 541*   Cardiac Enzymes: No results found for this basename: CKTOTAL:5,CKMB:5,CKMBINDEX:5,TROPONINI:5 in the last 168 hours BNP: No components found with this basename: POCBNP:5 CBG: No results found for this  basename: GLUCAP:5 in the last 168 hours  Radiological Exams on Admission: Ct Head Wo Contrast  07/12/2012  *RADIOLOGY REPORT*  Clinical Data:  Altered mental status  CT HEAD WITHOUT CONTRAST  Technique:  Contiguous axial images were obtained from the base of the skull through the vertex without contrast. Study was obtained within 24 hours of patient arrival at the emergency department.  Comparison: None.  Findings: There is mild diffuse atrophy.  There is no mass, hemorrhage, extra-axial fluid collection, or midline shift.  No focal gray-white compartment lesions are identified.  No acute infarct is appreciable on this study.  Bony calvarium appears intact.  The mastoid air cells are clear.  IMPRESSION: Mild atrophy.  Study otherwise unremarkable.   Original Report Authenticated By: Bretta Bang, M.D.    Dg Chest Port 1 View  07/12/2012  *RADIOLOGY REPORT*  Clinical Data: Unresponsive, pain  PORTABLE CHEST - 1 VIEW  Comparison: 05/06/2010  Findings: Cardiac size is stable.  There is widening  mid mediastinum and left hilar prominence.  Hazy opacification of the left hemithorax may be due to effusion with associated atelectasis or diffuse infiltrates/pneumonia.  Asymmetric pulmonary edema cannot be excluded.  Central mild vascular congestion.  Mild right perihilar interstitial prominence.  IMPRESSION:  There is widening  mid mediastinum and left hilar prominence. Hazy opacification of the left hemithorax may be due to effusion with associated atelectasis or diffuse infiltrates/pneumonia. Asymmetric pulmonary edema cannot be excluded.  Central mild vascular congestion.  Mild right perihilar interstitial prominence.   Original Report Authenticated By: Natasha Mead, M.D.     EKG: pending  Assessment/Plan Principal Problem:  *Septic shock Likely in the setting off healthcare associated pneumonia with chest x-ray findings off significant infiltrate over left hemithorax. Patient seen by a PC CM in the ED  and after discussion with his sister he was needed DO NOT RESUSCITATE with ?plan for comfort care once the sister arrived. PC CM recommended admission to step down unit for now. He has been placed on BiPAP and his saturation is maintained. ABG shows hypercapnic respiratory failure. Was given a dose of IV vancomycin and IV Zosyn in the ED. I will continue him on broad  antibiotic coverage with vancomycin, Zosyn and Levaquin. -Patient's UA also suggest UTI however has chronic  suprapubic catheter. We'll follow with urine culture. -follow blood cultures sent from the ED. Follow Legionella antigen and strep antigen in urine. -I called his sister Maureen Ralphs  and who has already left for the hospital and was unable to reach her. I will decide on further plan of care once i speak  with her. 80s.  Active Problems:  Healthcare-associated pneumonia As outlined above. Continue broad-spectrum antibiotic.   Acute respiratory failure Patient presented with hypercapnic respiratory failure currently on BiPAP and maintaining O2 saturation. After discussion with patient's sister PC CM made him DO NOT RESUSCITATE.   Altered mental status Likely in the setting off pneumonia and sepsis with a steady failure. Continue monitoring in step down for now.   H/O CHF Appears euvolemic clinically. Will continue with IV hydration and monitor.    paraplegia patient has history of T7-T8 paraplegia secondary to falling the 70s. He has chronic colostomy and a suprapubic catheter. He also has a stage IV ulcer in the coccyx and had recent plastic surgery for the wound. He was started on Zyvox and Augmentin about 2 weeks back with concern for infection in the nursing home.   Seizure disorder Patient is on Keppra and Depakote in the nursing home. I placed him on IV Keppra and resume his by mouth Depakote once he is able to take by mouth.   Decubitus ulcer of coccygeal region Does not appear infected clinically. He however was on  antibiotic started in a nursing home until recently.   Hyperkalemia Continue telemetry monitoring. Monitor potassium level in the morning. Check EKG   Anemia Stable at this time . monitor in a.m.labs.  Depression Will hold home medications.   Hypotension continue  IV fluids. Hold home medications  Hypertonic bladder  Diet: NPO for now  DVT prophylaxis: sq lovenox  Code Status: DNR. As per ED and PCCM, sister does not want patient to be intubated, placed central line or started on pressors. Prognosis is guarded.   Family Communication: called sister vivian ( PCCM spoke with her earlier). Will talk to her further once she arrives to the hospital Disposition Plan:  Admit to stepdown  Eddie North [facility] Pager [phone]  If 7PM-7AM, please contact night-coverage [email or web address] Password [email or web address] 07/12/2012, 3:02 PM

## 2012-07-12 NOTE — ED Provider Notes (Signed)
History     CSN: [id number]  Arrival date & time 07/12/12  1158   First MD Initiated Contact with Patient 07/12/12 1207      Chief Complaint  Patient presents with  . Altered Mental Status    (Consider location/radiation/quality/duration/timing/severity/associated sxs/prior treatment) The history is provided by the EMS personnel. The history is limited by the condition of the patient.   patient here from nursing home unresponsive. Last seen normal was 2 hours ago. EMS was called patient's CBG was about 100. He has been gradually more responsive spontaneously. No recent illnesses according to the paramedics. Patient himself cannot give any history at this time. No recent narcotic use. No further history obtainable  Past Medical History  Diagnosis Date  . Coronary artery disease   . CHF (congestive heart failure)   . Paraplegia   . GERD (gastroesophageal reflux disease)   . Seizure     History reviewed. No pertinent past surgical history.  Family History  Problem Relation Age of Onset  . Heart failure Mother   . Heart failure Father     History  Substance Use Topics  . Smoking status: Never Smoker   . Smokeless tobacco: Not on file  . Alcohol Use: No      Review of Systems  Unable to perform ROS   Allergies  Ciprofloxacin in d5w and Minocycline hcl  Home Medications   Current Outpatient Rx  Name  Route  Sig  Dispense  Refill  . ACETAMINOPHEN 325 MG PO TABS   Oral   Take 650 mg by mouth every 6 (six) hours as needed. For pain          . AMLODIPINE BESYLATE 2.5 MG PO TABS   Oral   Take 7.5 mg by mouth daily.           Marland Kitchen BACLOFEN 10 MG PO TABS   Oral   Take 10 mg by mouth 3 (three) times daily.           Marland Kitchen DIVALPROEX SODIUM 250 MG PO TBEC   Oral   Take 250 mg by mouth 2 (two) times daily. Take along with depakote 500mg           . DIVALPROEX SODIUM 500 MG PO TBEC   Oral   Take 500 mg by mouth 2 (two) times daily. Take with Depakote 250mg            . FERROUS SULFATE 325 (65 FE) MG PO TABS   Oral   Take 325 mg by mouth 2 (two) times daily.           Marland Kitchen FLUOXETINE HCL 20 MG PO CAPS   Oral   Take 20 mg by mouth daily. Along with Prozac 40mg           . FLUOXETINE HCL 40 MG PO CAPS   Oral   Take 40 mg by mouth 2 (two) times daily. Along with Prozac 20mg           . HYDROCHLOROTHIAZIDE 25 MG PO TABS   Oral   Take 25 mg by mouth daily.           Marland Kitchen HYDROCODONE-ACETAMINOPHEN 10-325 MG PO TABS   Oral   Take 1 tablet by mouth every 4 (four) hours as needed. For pain          . HYDROXYZINE HCL 25 MG PO TABS   Oral   Take 25 mg by mouth 2 (two) times daily.           Marland Kitchen  LANSOPRAZOLE 30 MG PO CPDR   Oral   Take 30 mg by mouth daily.           Marland Kitchen LEVETIRACETAM 1000 MG PO TABS   Oral   Take 1,000 mg by mouth 2 (two) times daily.           . CERTAGEN PO   Oral   Take 1 tablet by mouth daily.           Marland Kitchen ZOLPIDEM TARTRATE 5 MG PO TABS   Oral   Take 5 mg by mouth at bedtime as needed.             BP 81/48  Resp 20  SpO2 99%  Physical Exam  Nursing note and vitals reviewed. Constitutional: He appears well-developed and well-nourished. He appears lethargic.  Non-toxic appearance. No distress.  HENT:  Head: Normocephalic and atraumatic.  Eyes: Conjunctivae normal, EOM and lids are normal. Pupils are equal, round, and reactive to light.  Neck: Normal range of motion. Neck supple. No tracheal deviation present. No mass present.  Cardiovascular: Normal rate, regular rhythm and normal heart sounds.  Exam reveals no gallop.   No murmur heard. Pulmonary/Chest: Effort normal and breath sounds normal. No stridor. No respiratory distress. He has no decreased breath sounds. He has no wheezes. He has no rhonchi. He has no rales.  Abdominal: Soft. Normal appearance and bowel sounds are normal. He exhibits no distension. There is no tenderness. There is no rebound and no CVA tenderness.  Musculoskeletal: Normal  range of motion. He exhibits no edema and no tenderness.  Neurological: He appears lethargic. GCS eye subscore is 4. GCS verbal subscore is 5. GCS motor subscore is 6.       Patient withdraws to pain. Eyes open to voice.  Skin: Skin is warm and dry. No abrasion and no rash noted.  Psychiatric: He has a normal mood and affect. His speech is normal and behavior is normal.    ED Course  Procedures (including critical care time)   Labs Reviewed  CBC WITH DIFFERENTIAL  COMPREHENSIVE METABOLIC PANEL  CULTURE, BLOOD (ROUTINE X 2)  CULTURE, BLOOD (ROUTINE X 2)  LACTIC ACID, PLASMA  URINALYSIS, ROUTINE W REFLEX MICROSCOPIC  URINE CULTURE  BLOOD GAS, ARTERIAL   No results found.   No diagnosis found.    MDM  Patient able to protect his airway at this time. Patient started on antibiotics for infection. Blood gas shows no signs of hypercarbnea. He was given Narcan with some improvement of his mental status. He is rechecked multiple times and has not required acute airway management at this time. Spoke with critical care, they will admit the patient   CRITICAL CARE Performed by: Toy Baker   Total critical care time: 75  Critical care time was exclusive of separately billable procedures and treating other patients.  Critical care was necessary to treat or prevent imminent or life-threatening deterioration.  Critical care was time spent personally by me on the following activities: development of treatment plan with patient and/or surrogate as well as nursing, discussions with consultants, evaluation of patient's response to treatment, examination of patient, obtaining history from patient or surrogate, ordering and performing treatments and interventions, ordering and review of laboratory studies, ordering and review of radiographic studies, pulse oximetry and re-evaluation of patient's condition.         Toy Baker, MD 07/12/12 1410

## 2012-07-12 NOTE — ED Notes (Signed)
Admitting MD aware of BP in 60's.

## 2012-07-12 NOTE — ED Notes (Addendum)
Pt found in NH unresponsive. LSN at 0830, usually talkative, BP 70 palp, CBG 129, from [place], NSR

## 2012-07-12 NOTE — Progress Notes (Addendum)
1845:  Nurse contacted MD to inform that BP dropped below 80's.  MD instructed nurse to order 500 NS bolus and monitor BP post transfusion.  Nurse administered Bolus as ordered and will continue to monitor.  1730:  MD was called by nurse when pt arrived on floor to inform of BP measurement.  MD acknowledged and informed nurse that at this time no additonal interventions were needed, instructed nurse to contact if systolic was < than 80.

## 2012-07-12 NOTE — ED Notes (Signed)
Patient transported to CT with RN 

## 2012-07-12 NOTE — Consult Note (Signed)
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  Presented 12/4 from SNF after being found hypotensive, unresponsive. In ER found to have near complete white out L hemithorax, hypoxic despite bipap and PCCM called to admit.    LINES / TUBES: none  CULTURES: Sputum 12/4>>> Urine 12/4>>> BCx2 12/4>>>  ANTIBIOTICS: Vanc 12/4>>> Zosyn 12/4>>>  SIGNIFICANT EVENTS:    PAST MEDICAL HISTORY :  Past Medical History  Diagnosis Date  . Coronary artery disease   . CHF (congestive heart failure)   . Paraplegia   . GERD (gastroesophageal reflux disease)   . Seizure    History reviewed. No pertinent past surgical history. Prior to Admission medications   Medication Sig Start Date End Date Taking? Authorizing Provider  acetaminophen (TYLENOL) 325 MG tablet Take 650 mg by mouth every 6 (six) hours as needed. For pain     Historical Provider, MD  baclofen (LIORESAL) 10 MG tablet Take 10 mg by mouth 3 (three) times daily.      Historical Provider, MD  divalproex (DEPAKOTE) 250 MG DR tablet Take 250 mg by mouth 2 (two) times daily. Take along with depakote 500mg      Historical Provider, MD  divalproex (DEPAKOTE) 500 MG DR tablet Take 500 mg by mouth 2 (two) times daily. Take with Depakote 250mg      Historical Provider, MD  FLUoxetine (PROZAC) 20 MG capsule Take 20 mg by mouth daily. Along with Prozac 40mg      Historical Provider, MD  FLUoxetine (PROZAC) 40 MG capsule Take 40 mg by mouth 2 (two) times daily. Along with Prozac 20mg      Historical Provider, MD  hydrochlorothiazide (HYDRODIURIL) 25 MG tablet Take 25 mg by mouth daily.      Historical Provider, MD  HYDROcodone-acetaminophen (NORCO) 10-325 MG per tablet Take 1 tablet by mouth every 4 (four)  hours as needed. For pain     Historical Provider, MD  hydrOXYzine (ATARAX/VISTARIL) 25 MG tablet Take 25 mg by mouth 2 (two) times daily.      Historical Provider, MD  lansoprazole (PREVACID) 30 MG capsule Take 30 mg by mouth daily.      Historical Provider, MD  levETIRAcetam (KEPPRA) 1000 MG tablet Take 1,000 mg by mouth 2 (two) times daily.      Historical Provider, MD  Multiple Vitamins-Minerals (CERTAGEN PO) Take 1 tablet by mouth daily.      Historical Provider, MD  zolpidem (AMBIEN) 5 MG tablet Take 5 mg by mouth at bedtime as needed.      Historical Provider, MD   Allergies  Allergen Reactions  . Ciprofloxacin In D5w Itching  . Minocycline Hcl Itching    FAMILY HISTORY:  Family History  Problem Relation Age of Onset  . Heart failure Mother   . Heart failure Father    SOCIAL HISTORY:  reports that he has never smoked. He does not have any smokeless tobacco history on file. He reports that he does not drink alcohol or use illicit drugs.  REVIEW OF SYSTEMS:  Unable, pt lethargic on bipap  INTERVAL HISTORY:   VITAL SIGNS: Temp:  [97.9 F (36.6 C)] 97.9 F (36.6 C) (12/04 1226) Pulse Rate:  [74-80] 74  (12/04 1315) Resp:  [19-22]  22  (12/04 1315) BP: (72-81)/(32-48) 78/48 mmHg (12/04 1315) SpO2:  [89 %-99 %] 89 % (12/04 1315) HEMODYNAMICS:   VENTILATOR SETTINGS:   INTAKE / OUTPUT: Intake/Output    None     PHYSICAL EXAMINATION: General:  Chronically ill appearing male, acutely ill Neuro:  Lethargic, grimaces to pain HEENT:  Mm dry, bipap Cardiovascular:  s1s2 distant Lungs:  Coarse throughout, resps even mildly labored on bipap Abdomen:  Obese, soft Ext: warm and dry, contracted, large area eschar/decub L buttocks    LABS: Cbc  Lab 07/12/12 1209  WBC 15.4*  HGB 10.0*  HCT 33.1*  PLT 541*    Chemistry   Lab 07/12/12 1209  NA 134*  K NOT DONE  CL 94*  CO2 26  BUN 37*  CREATININE 1.35  CALCIUM 8.9  MG --  PHOS --  GLUCOSE 123*    Liver  fxn  Lab 07/12/12 1209  AST 80*  ALT NOT DONE  ALKPHOS NOT DONE  BILITOT 0.3  PROT 8.2  ALBUMIN 2.2*   coags No results found for this basename: APTT:3,INR:3 in the last 168 hours Sepsis markers  Lab 07/12/12 1230  LATICACIDVEN 1.20  PROCALCITON --   Cardiac markers No results found for this basename: CKTOTAL:3,CKMB:3,TROPONINI:3 in the last 168 hours BNP No results found for this basename: PROBNP:3 in the last 168 hours ABG  Lab 07/12/12 1237  PHART 7.306*  PCO2ART 60.6*  PO2ART 64.0*  HCO3 30.3*  TCO2 32    CBG trend No results found for this basename: GLUCAP:5 in the last 168 hours  IMAGING: Ct Head Wo Contrast  07/12/2012  *RADIOLOGY REPORT*  Clinical Data:  Altered mental status  CT HEAD WITHOUT CONTRAST  Technique:  Contiguous axial images were obtained from the base of the skull through the vertex without contrast. Study was obtained within 24 hours of patient arrival at the emergency department.  Comparison: None.  Findings: There is mild diffuse atrophy.  There is no mass, hemorrhage, extra-axial fluid collection, or midline shift.  No focal gray-white compartment lesions are identified.  No acute infarct is appreciable on this study.  Bony calvarium appears intact.  The mastoid air cells are clear.  IMPRESSION: Mild atrophy.  Study otherwise unremarkable.   Original Report Authenticated By: Bretta Bang, M.D.    Dg Chest Port 1 View  07/12/2012  *RADIOLOGY REPORT*  Clinical Data: Unresponsive, pain  PORTABLE CHEST - 1 VIEW  Comparison: 05/06/2010  Findings: Cardiac size is stable.  There is widening  mid mediastinum and left hilar prominence.  Hazy opacification of the left hemithorax may be due to effusion with associated atelectasis or diffuse infiltrates/pneumonia.  Asymmetric pulmonary edema cannot be excluded.  Central mild vascular congestion.  Mild right perihilar interstitial prominence.  IMPRESSION:  There is widening  mid mediastinum and left hilar  prominence. Hazy opacification of the left hemithorax may be due to effusion with associated atelectasis or diffuse infiltrates/pneumonia. Asymmetric pulmonary edema cannot be excluded.  Central mild vascular congestion.  Mild right perihilar interstitial prominence.   Original Report Authenticated By: Natasha Mead, M.D.     ECG:  DIAGNOSES: Active Problems:  * No active hospital problems. *    ASSESSMENT / PLAN:  Acute resp failure  AMS HCAP  Septic shock  CHF  Paraplegia   Pt critically ill with acute resp failure likely r/t HCAP, with septic shock.  Remains hypoxic despite bipap.   Discussed with sister at length via phone who states pt has  been declining over the last 4 years and his quality of life is poor.  He would not want aggressive interventions.  She agrees with reasonable medical rx for now, but if pt worsens would want to transition to full comfort measures.    PLAN -  Pan culture Broad spectrum abx to cover HCAP +/- wound infection  DNR/DNI No central line  Cont bipap for now, likely d/c when family arrives  Volume  If stabilizes will need wound care   Would admit to SDU, BiPAP is optional at this point, IV hydration, treatment for abx for HCAP (patient from a nursing home).  Spoke with sister, would like for him to be a full NCB and will likely transition to comfort upon arrival.  OK for TRH to admit, PCCM will be available PRN.  No CPR, cardioversion, pressors, central access, intubation or anti-arrhythmics.  I have personally obtained a history, examined the patient, evaluated laboratory and imaging results, formulated the assessment and plan and placed orders.  CRITICAL CARE: The patient is critically ill with multiple organ systems failure and requires high complexity decision making for assessment and support, frequent evaluation and titration of therapies, application of advanced monitoring technologies and extensive interpretation of multiple databases. Critical  Care Time devoted to patient care services described in this note is 35 minutes.   Alyson Reedy, M.D. [facility] Pulmonary/Critical Care Medicine. Pager: [phone]. After hours pager: [phone]

## 2012-07-13 ENCOUNTER — Inpatient Hospital Stay (HOSPITAL_COMMUNITY): Payer: PRIVATE HEALTH INSURANCE

## 2012-07-13 DIAGNOSIS — L8990 Pressure ulcer of unspecified site, unspecified stage: Secondary | ICD-10-CM

## 2012-07-13 DIAGNOSIS — L89109 Pressure ulcer of unspecified part of back, unspecified stage: Secondary | ICD-10-CM

## 2012-07-13 DIAGNOSIS — Z8679 Personal history of other diseases of the circulatory system: Secondary | ICD-10-CM

## 2012-07-13 DIAGNOSIS — A419 Sepsis, unspecified organism: Secondary | ICD-10-CM

## 2012-07-13 DIAGNOSIS — Z933 Colostomy status: Secondary | ICD-10-CM

## 2012-07-13 LAB — BLOOD GAS, ARTERIAL
Acid-Base Excess: 0.2 mmol/L (ref 0.0–2.0)
Acid-Base Excess: 0.9 mmol/L (ref 0.0–2.0)
Bicarbonate: 27.5 mEq/L — ABNORMAL HIGH (ref 20.0–24.0)
Delivery systems: POSITIVE
Delivery systems: POSITIVE
Drawn by: 31101
Drawn by: 31101
Expiratory PAP: 10
Expiratory PAP: 10
FIO2: 0.8 %
FIO2: 100 %
Inspiratory PAP: 18
Inspiratory PAP: 18
Mode: POSITIVE
O2 Saturation: 95.8 %
O2 Saturation: 99.4 %
Patient temperature: 98.6
Patient temperature: 98.6
RATE: 14 resp/min
TCO2: 29.6 mmol/L (ref 0–100)
pCO2 arterial: 66.8 mmHg (ref 35.0–45.0)
pH, Arterial: 7.239 — ABNORMAL LOW (ref 7.350–7.450)
pO2, Arterial: 169 mmHg — ABNORMAL HIGH (ref 80.0–100.0)

## 2012-07-13 LAB — COMPREHENSIVE METABOLIC PANEL
CO2: 24 mEq/L (ref 19–32)
Calcium: 8.1 mg/dL — ABNORMAL LOW (ref 8.4–10.5)
Creatinine, Ser: 1.15 mg/dL (ref 0.50–1.35)
GFR calc Af Amer: 78 mL/min — ABNORMAL LOW (ref >90)
GFR calc non Af Amer: 67 mL/min — ABNORMAL LOW (ref >90)
Glucose, Bld: 95 mg/dL (ref 70–99)

## 2012-07-13 LAB — URINE CULTURE: Colony Count: 40000

## 2012-07-13 LAB — PROCALCITONIN: Procalcitonin: 0.21 ng/mL

## 2012-07-13 LAB — CBC
Hemoglobin: 8.9 g/dL — ABNORMAL LOW (ref 13.0–17.0)
RBC: 3.61 MIL/uL — ABNORMAL LOW (ref 4.22–5.81)
WBC: 16.5 10*3/uL — ABNORMAL HIGH (ref 4.0–10.5)

## 2012-07-13 MED ORDER — LEVETIRACETAM 500 MG PO TABS
1000.0000 mg | ORAL_TABLET | Freq: Two times a day (BID) | ORAL | Status: DC
  Administered 2012-07-13 – 2012-07-19 (×12): 1000 mg via ORAL
  Filled 2012-07-13 (×14): qty 2

## 2012-07-13 MED ORDER — PRO-STAT SUGAR FREE PO LIQD
30.0000 mL | Freq: Two times a day (BID) | ORAL | Status: DC
  Administered 2012-07-13 (×2): 30 mL via ORAL
  Filled 2012-07-13 (×4): qty 30

## 2012-07-13 MED ORDER — FLUOXETINE HCL 20 MG PO CAPS
60.0000 mg | ORAL_CAPSULE | Freq: Two times a day (BID) | ORAL | Status: DC
  Administered 2012-07-13 – 2012-07-14 (×2): 60 mg via ORAL
  Filled 2012-07-13 (×4): qty 3

## 2012-07-13 MED ORDER — JUVEN PO PACK
1.0000 | PACK | Freq: Two times a day (BID) | ORAL | Status: DC
  Administered 2012-07-13 – 2012-07-14 (×2): 1 via ORAL
  Filled 2012-07-13 (×3): qty 1

## 2012-07-13 MED ORDER — SODIUM CHLORIDE 0.9 % IV BOLUS (SEPSIS)
500.0000 mL | Freq: Once | INTRAVENOUS | Status: AC
  Administered 2012-07-13: 500 mL via INTRAVENOUS

## 2012-07-13 MED ORDER — GUAIFENESIN ER 600 MG PO TB12
600.0000 mg | ORAL_TABLET | Freq: Two times a day (BID) | ORAL | Status: DC
  Administered 2012-07-13 – 2012-07-14 (×2): 600 mg via ORAL
  Filled 2012-07-13 (×4): qty 1

## 2012-07-13 MED ORDER — POLYSACCHARIDE IRON COMPLEX 150 MG PO CAPS
150.0000 mg | ORAL_CAPSULE | Freq: Every day | ORAL | Status: DC
  Administered 2012-07-13 – 2012-07-14 (×2): 150 mg via ORAL
  Filled 2012-07-13 (×2): qty 1

## 2012-07-13 MED ORDER — DIVALPROEX SODIUM 250 MG PO DR TAB
750.0000 mg | DELAYED_RELEASE_TABLET | Freq: Every day | ORAL | Status: DC
  Administered 2012-07-13 – 2012-07-19 (×7): 750 mg via ORAL
  Filled 2012-07-13 (×8): qty 3

## 2012-07-13 MED ORDER — ADULT MULTIVITAMIN W/MINERALS CH
1.0000 | ORAL_TABLET | Freq: Every day | ORAL | Status: DC
  Administered 2012-07-13 – 2012-07-14 (×2): 1 via ORAL
  Filled 2012-07-13 (×2): qty 1

## 2012-07-13 MED ORDER — COLLAGENASE 250 UNIT/GM EX OINT
TOPICAL_OINTMENT | Freq: Every day | CUTANEOUS | Status: DC
  Administered 2012-07-13 – 2012-07-19 (×7): via TOPICAL
  Filled 2012-07-13 (×3): qty 30

## 2012-07-13 MED ORDER — MORPHINE SULFATE 2 MG/ML IJ SOLN
1.0000 mg | INTRAMUSCULAR | Status: DC | PRN
  Administered 2012-07-13 – 2012-07-17 (×19): 2 mg via INTRAVENOUS
  Administered 2012-07-17: 1 mg via INTRAVENOUS
  Administered 2012-07-17 – 2012-07-19 (×9): 2 mg via INTRAVENOUS
  Filled 2012-07-13 (×30): qty 1

## 2012-07-13 MED ORDER — BACLOFEN 10 MG PO TABS
10.0000 mg | ORAL_TABLET | Freq: Four times a day (QID) | ORAL | Status: DC
  Administered 2012-07-13 – 2012-07-14 (×3): 10 mg via ORAL
  Filled 2012-07-13 (×9): qty 1

## 2012-07-13 MED ORDER — ENSURE COMPLETE PO LIQD
237.0000 mL | Freq: Two times a day (BID) | ORAL | Status: DC
  Administered 2012-07-13 – 2012-07-14 (×3): 237 mL via ORAL

## 2012-07-13 MED ORDER — SACCHAROMYCES BOULARDII 250 MG PO CAPS
250.0000 mg | ORAL_CAPSULE | Freq: Two times a day (BID) | ORAL | Status: DC
  Administered 2012-07-13 – 2012-07-19 (×11): 250 mg via ORAL
  Filled 2012-07-13 (×15): qty 1

## 2012-07-13 MED ORDER — FLUOXETINE HCL 20 MG PO CAPS
60.0000 mg | ORAL_CAPSULE | Freq: Two times a day (BID) | ORAL | Status: DC
  Filled 2012-07-13 (×2): qty 3

## 2012-07-13 MED ORDER — LATANOPROST 0.005 % OP SOLN
1.0000 [drp] | Freq: Every day | OPHTHALMIC | Status: DC
  Administered 2012-07-13 – 2012-07-18 (×6): 1 [drp] via OPHTHALMIC
  Filled 2012-07-13: qty 2.5

## 2012-07-13 MED ORDER — OXYBUTYNIN CHLORIDE ER 5 MG PO TB24
5.0000 mg | ORAL_TABLET | Freq: Every day | ORAL | Status: DC
  Filled 2012-07-13 (×2): qty 1

## 2012-07-13 NOTE — Progress Notes (Signed)
INITIAL ADULT NUTRITION ASSESSMENT Date: 07/13/2012   Time: 1:26 PM  Reason for Assessment: Low Braden  INTERVENTION: 1. Encourage protein intake at each meal  2. Juven 1 packet PO BID  3. Ensure Complete po BID, each supplement provides 350 kcal and 13 grams of protein.  4.. 30 ml Prostat PO BID, each supplement provides 100 kcal and 15 grams protein.  5. Consider 2 week therapy of 220 mg zinc sulfate daily and 500 mg vitamin C BID supplementation to support wound healing  6. Continue least restrictive diet to encourage PO intake  7. RD to continue to follow nutrition care plan  DOCUMENTATION CODES Per approved criteria  -Not Applicable    ASSESSMENT: Male 61 y.o.  Dx: Septic shock  Hx:  Past Medical History  Diagnosis Date  . Coronary artery disease   . CHF (congestive heart failure)   . Paraplegia   . GERD (gastroesophageal reflux disease)   . Seizure    History reviewed. No pertinent past surgical history.  Related Meds:     . antiseptic oral rinse  15 mL Mouth Rinse q12n4p  . baclofen  10 mg Oral QID  . chlorhexidine  15 mL Mouth Rinse BID  . divalproex  750 mg Oral Daily  . enoxaparin (LOVENOX) injection  40 mg Subcutaneous Q24H  . feeding supplement  237 mL Oral BID BM  . FLUoxetine  60 mg Oral BID  . guaiFENesin  600 mg Oral BID  . iron polysaccharides  150 mg Oral Daily  . latanoprost  1 drop Both Eyes QHS  . levETIRAcetam  1,000 mg Oral BID  . levofloxacin (LEVAQUIN) IV  750 mg Intravenous Q24H  . oxybutynin  5 mg Oral QHS  . [COMPLETED] piperacillin-tazobactam (ZOSYN)  IV  3.375 g Intravenous Once  . piperacillin-tazobactam (ZOSYN)  IV  3.375 g Intravenous Q8H  . saccharomyces boulardii  250 mg Oral BID  . [COMPLETED] sodium chloride  1,000 mL Intravenous Once  . [COMPLETED] sodium chloride  500 mL Intravenous Once  . [COMPLETED] sodium chloride  500 mL Intravenous Once  . [COMPLETED] vancomycin  1,500 mg Intravenous Once  . vancomycin  1,000 mg  Intravenous Q12H  . [DISCONTINUED] FLUoxetine  60 mg Oral BID  . [DISCONTINUED] levetiracetam  1,000 mg Intravenous Q12H  . [DISCONTINUED] piperacillin-tazobactam (ZOSYN)  IV  3.375 g Intravenous Q8H  . [DISCONTINUED] piperacillin-tazobactam (ZOSYN)  IV  3.375 g Intravenous Q8H  . [DISCONTINUED] vancomycin  1,000 mg Intravenous Once   Ht: 6' 0.83" (185 cm)  Wt: 224 lb 13.9 oz (102 kg)  Ideal Wt: 160 lb/72.8 kg - adjusted for paraplegia % Ideal Wt: 140%  Wt Readings from Last 15 Encounters:  07/12/12 224 lb 13.9 oz (102 kg)  06/14/11 244 lb (110.678 kg)  Usual Wt: 245 lb % Usual Wt: 91%; 9% wt loss x 1 year  Body mass index is 29.80 kg/(m^2). Overweight.  Labs:  CMP     Component Value Date/Time   NA 137 07/13/2012 0500   K 5.4* 07/13/2012 0500   CL 102 07/13/2012 0500   CO2 24 07/13/2012 0500   GLUCOSE 95 07/13/2012 0500   BUN 34* 07/13/2012 0500   CREATININE 1.15 07/13/2012 0500   CALCIUM 8.1* 07/13/2012 0500   PROT 7.1 07/13/2012 0500   ALBUMIN 1.8* 07/13/2012 0500   AST 21 07/13/2012 0500   ALT 25 07/13/2012 0500   ALKPHOS 137* 07/13/2012 0500   BILITOT 0.2* 07/13/2012 0500   GFRNONAA 67* 07/13/2012  0500   GFRAA 78* 07/13/2012 0500   No recent magnesium or phosphorus.   Intake/Output Summary (Last 24 hours) at 07/13/12 1328 Last data filed at 07/13/12 1000  Gross per 24 hour  Intake   2440 ml  Output    100 ml  Net   2340 ml  BM 12/4  Diet Order: Cardiac  Supplements/Tube Feeding: Ensure Complete PO BID  IVF:    sodium chloride Last Rate: 125 mL (07/13/12 1000)  [DISCONTINUED] sodium chloride Last Rate: 125 mL (07/12/12 1726)   Estimated Nutritional Needs:   Kcal: 2000 - 2300 kcal Protein:  95 - 120 grams Fluid: 2 - 2.3 liters daily  From [facility]. Admitted for unresponsiveness and hypotension. Past medical hx significant for paraplegia 2/2 fall in 1970s and stage IV sacral ulcer (with recent surgery and placed on Zyvox and Augmentin in the  nursing home 2 weeks ago with concern for infection.)  WOC RN saw pt this morning for sacral and buttocks wound. Per her assessment, pt is followed [facility] by [facility] for wound assessments. RN suspects wound has declined while at [facility].  Pt denies any concerns with nutrition or his appetite at this time. States he is eating well. Enjoys Ensure supplements.  Pt is at nutrition risk given poor skin integrity and chronic medical issues.  NUTRITION DIAGNOSIS: Increased nutrient needs r/t extensive wounds AEB estimated needs.  MONITORING/EVALUATION(Goals): Monitor: weight trends, lab trends, I/O's, PO intake, supplement tolerance Goal: Pt to meet >/= 90% of their estimated nutrition needs  EDUCATION NEEDS: -No education needs identified at this time  Jarold Motto MS, RD, LDN Pager: [phone] After-hours pager: [phone]

## 2012-07-13 NOTE — Progress Notes (Signed)
Placed pt . Back on bipap. Pt. Became SOB. Pt. Is tolerating well at this time.

## 2012-07-13 NOTE — Progress Notes (Signed)
0200: Discussed Pt with NP. Order Bladder scanner. 0230: Noted only 84cc per bladder scanner in middle of bladder ( fluid noted in middle of diagram on bladder scanner) Urine continues to leak around suprapubic site.  Pt pulled IV out. Pt unable to explain why he pulled IV out. Pt cleaned and instructed on plan of care and made aware of all equipment to provide pt optimal care. Pt verbalized understanding and readback. Pt begins to question events of the day/arrival. Explained to pt based off notes. Pt begins to say "I told them I could not breath correctly, I told them". Allow pt to vent concerns to this nurse and provided reassurance.  Will continue to monitor closely.

## 2012-07-13 NOTE — Progress Notes (Signed)
ABG results: PH: 7.21 CO2: 69.4 PO2: 89.4 HCO3: 27.2  NP notified via text page 1927.  2000: Pt resting in room on Bipap sats 97%, 22-24RR. Does not appear in distress, BP 108/43. Discuss course of care and events of day with NP. Discuss orders and continue of care and to have pt be full code.  Will continue to monitor closely

## 2012-07-13 NOTE — Progress Notes (Addendum)
[facility] PCCM  I was called to the bedside emergently to evaluate [name] condition this evening.  He has had progressive respiratory failure this evening despite BIPAP.  He has severe sepsis with severe HCAP, hypercaribic anc hypoxemic respiratory failure.  This is all on a background of four years of a very poor quality of life and suffering.   Filed Vitals:   07/13/12 1830 07/13/12 1834 07/13/12 1907 07/13/12 2012  BP: 150/60  96/42 108/43  Pulse: 102  96 95  Temp:    98.6 F (37 C)  TempSrc:    Axillary  Resp: 33  24 21  Height:      Weight:      SpO2:  82% 96% 96%   Gen: morbidly obese, shallow respirations on BIPAP PULM: Crackles L lung throughout CV: RRR, no mgr AB: BS+, soft, nontender Ext: edema, boots in place Neuro: somnolent but arouses and A&Ox4, moves arms without difficulty  Impression:  1) Hypoxemic, hypercarbic respiratory failure 2) HCAP 3) Severe Sepsis 4) Thoracic level Paraplegia 5) decubitus ulcer  Tonight [name] was lucid and capable of telling me that he was in [place], [facility], and that the year was 2013.  I explained to him that should he go on full (invasive) mechanical ventilation that his chances of coming off the ventilator are miniscule.  Further CPR would be painful and chances of a meaningful recovery would also be very low.  After explaining this to him he agreed with me that his code status should be Full No Code Blue.  No vasopressors, no mechanical ventilation, no CPR.   We will continue current measures, but not escalate care.  Should he show signs of suffering then we will start morphine and full comfort measures. He agrees with this plan.  I called his sister [name] and discussed this at length.  She fully agrees with this plan, as she had on multiple conversations documented previously.  Total CC time tonight by me 40 minutes.  Yolonda Kida PCCM Pager: [phone] Cell: [phone] If no response, call  [phone]

## 2012-07-13 NOTE — Progress Notes (Signed)
RN took patient off bipap and placed on 100% NRB. Patient tolerating well at this time. RT will monitor

## 2012-07-13 NOTE — Progress Notes (Signed)
0630: Pt c/o back pain/hip pain, rates 8/10 shooting. Pt states "I usually get morphine shot in the hospital and pill at home". Text page NP. No response yet. Reported to oncoming nurse.

## 2012-07-13 NOTE — Progress Notes (Signed)
Patient taken off bipap by MD. Placed on 100% NRB Tolerating okay at this time. Will monitor

## 2012-07-13 NOTE — Consult Note (Signed)
Will follow.   Attempt to soften eschar chemically to facilitate debridement.

## 2012-07-13 NOTE — Progress Notes (Addendum)
Pt stated to nurse that if he did go into respiratory failure he would "want everything done possible".  MD called nurse and was informed of current status and recent events including pt request for code status change and stated she would get code status changed.  Pt sister called and nurse informed her of pt current status and code status change request.  Information shared with oncoming night shift nurse

## 2012-07-13 NOTE — Progress Notes (Addendum)
[facility] PROGRESS NOTE  [name] ZOX:[id number] DOB: 1950/08/19 DOA: 07/12/2012 PCP: No primary provider on file.  Assessment/Plan: Principal Problem:  *sepsis associated hypotension Due to infected sacral decub and pneumonia. Improved with volume replacement- without pressors.   Active Problems:  Acute respiratory failure Pulse ox dropping to 70s when O2 removed- upon my exam this AM Due to pneumonia-- sputum culture if possible urine leaking around foley and therefore difficult to collect to look for antigens.  Procalcitonin is 0.21 which may mean pneumonia is viral- cont Vanc Zosyn and Levaquin for HCAP for now Have ordered ECHO evaluate for CHF   Altered mental status Resolved- now alert and oriented   Decubitus ulcer of coccygeal region Has had chronic decubitus ulcers with infections- per prior notes and numerous CTs - will need to ask for ID consult - wound care consulted - surgery consulted for further debridement-  Hydrotherapy and Santyl dressing ordered by consultants Have requested records from [facility]  - ID consulted to assist    H/O CHF Appears to be diastolic dysfunction per echo in 2006- have repeated echo as mentioned above.  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  Patient was alert and oriented this am and complaining of cough . Around 11:30 am he was poorly responsive and lethargic and shaky. BP was 90/70  Patient at baseline is Well alert and oriented and able to communicate well. He is immobile however is able to turn in bed. As per the nurse he was started on Zyvox and Augmentin of 2 weeks back for possible infection over the coccyx and is still on it. He however did not have any fever. Patient was then transferred to [facility]. In the ED he was placed on BiPAP with a ABG showing hypercapnic respiratory failure. His blood pressure was 72/32 mmhg. her chest x-ray done showed hazy opacification of left hemithorax. [facility] and was called for admission to ICU. However given his multiple comorbidities, PC CM and discussed with patient's sister Maureen Ralphs who recommended to make him DO NOT RESUSCITATE and possible comfort care. Patient was given a dose of IV vancomycin and Zosyn in the ED along with IV fluids and recommended to be admited to step down unit under [facility] care.  Patient did not complain of any chest pain, shortness of breath, palpitations, abdominal pain, headache, nausea or vomiting. He did not have any diarrhea fever or chills.   Consultants:  surgery  Procedures:  none  Antibiotics:  Vanc- Zosyn- Levaquin - started 12/4  HPI/Subjective: Pt alert- I am able to remove Bipap- talking w/o distress- admits to 3 days of dry cough- states he has been eating and  drinking fine. No other complaints.   Objective: Filed Vitals:   07/13/12 0800 07/13/12 0900 07/13/12 0930 07/13/12 1010  BP: 102/46 115/53 107/44   Pulse: 87 85 89 69  Temp:      TempSrc:      Resp: 20 25 20 25   Height:      Weight:      SpO2: 93%   93%    Intake/Output Summary (Last 24 hours) at  07/13/12 1019 Last data filed at 07/13/12 0900  Gross per 24 hour  Intake   2315 ml  Output    100 ml  Net   2215 ml    Exam:   General:  Alert, no distress  Cardiovascular: RRR, no murmurs  Respiratory: very decreased breath sounds  Abdomen: soft, NT, ND, colostomy intact- suprapubic cath leaking from around the site- no urine going into collecting bag.   Ext: deformities in feet- no edema  Data Reviewed: Basic Metabolic Panel:  Lab 07/13/12 1610 07/12/12 1335 07/12/12 1209  NA 137 139 134*  K 5.4* 5.7* NOT DONE  CL 102 100 94*  CO2 24 29 26   GLUCOSE 95 112* 123*  BUN 34* 37* 37*  CREATININE 1.15 1.47* 1.35  CALCIUM 8.1* 8.3* 8.9  MG -- -- --  PHOS -- -- --   Liver Function Tests:  Lab 07/13/12 0500 07/12/12 1335 07/12/12 1209  AST 21 17 80*  ALT 25 29 NOT DONE  ALKPHOS 137* 152* NOT DONE  BILITOT 0.2* 0.2* 0.3  PROT 7.1 7.3 8.2  ALBUMIN 1.8* 2.1* 2.2*   No results found for this basename: LIPASE:5,AMYLASE:5 in the last 168 hours No results found for this basename: AMMONIA:5 in the last 168 hours CBC:  Lab 07/13/12 0500 07/12/12 1209  WBC 16.5* 15.4*  NEUTROABS -- 12.7*  HGB 8.9* 10.0*  HCT 29.9* 33.1*  MCV 82.8 80.5  PLT 504* 541*   Cardiac Enzymes: No results found for this basename: CKTOTAL:5,CKMB:5,CKMBINDEX:5,TROPONINI:5 in the last 168 hours BNP (last 3 results)  Basename 07/12/12 1320  PROBNP 3087.0*   CBG: No results found for this basename: GLUCAP:5 in the last 168 hours  Recent Results (from the past 240 hour(s))  CULTURE, BLOOD (ROUTINE X 2)     Status: Normal (Preliminary result)   Collection Time   07/12/12 12:20 PM      Component Value Range Status Comment   Specimen Description BLOOD ARM RIGHT   Final    Special Requests BOTTLES DRAWN AEROBIC ONLY   Final    Culture  Setup Time 07/12/2012 21:55   Final    Culture     Final    Value:        BLOOD CULTURE RECEIVED NO GROWTH TO DATE CULTURE WILL BE HELD FOR 5 DAYS BEFORE  ISSUING A FINAL NEGATIVE REPORT   Report Status PENDING   Incomplete   CULTURE, BLOOD (ROUTINE X 2)     Status: Normal (Preliminary result)   Collection Time   07/12/12 12:44 PM      Component Value Range Status Comment   Specimen Description BLOOD HAND RIGHT   Final    Special Requests BOTTLES DRAWN AEROBIC ONLY 2CC   Final    Culture  Setup Time 07/12/2012 16:52   Final    Culture     Final    Value:        BLOOD CULTURE RECEIVED NO GROWTH TO DATE CULTURE WILL BE HELD FOR 5 DAYS BEFORE ISSUING A  FINAL NEGATIVE REPORT   Report Status PENDING   Incomplete   MRSA PCR SCREENING     Status: Normal   Collection Time   07/12/12  5:22 PM      Component Value Range Status Comment   MRSA by PCR NEGATIVE  NEGATIVE Final      Studies: Ct Head Wo Contrast  07/12/2012  *RADIOLOGY REPORT*  Clinical Data:  Altered mental status  CT HEAD WITHOUT CONTRAST  Technique:  Contiguous axial images were obtained from the base of the skull through the vertex without contrast. Study was obtained within 24 hours of patient arrival at the emergency department.  Comparison: None.  Findings: There is mild diffuse atrophy.  There is no mass, hemorrhage, extra-axial fluid collection, or midline shift.  No focal gray-white compartment lesions are identified.  No acute infarct is appreciable on this study.  Bony calvarium appears intact.  The mastoid air cells are clear.  IMPRESSION: Mild atrophy.  Study otherwise unremarkable.   Original Report Authenticated By: Bretta Bang, M.D.    Dg Chest Port 1 View  07/12/2012  *RADIOLOGY REPORT*  Clinical Data: Unresponsive, pain  PORTABLE CHEST - 1 VIEW  Comparison: 05/06/2010  Findings: Cardiac size is stable.  There is widening  mid mediastinum and left hilar prominence.  Hazy opacification of the left hemithorax may be due to effusion with associated atelectasis or diffuse infiltrates/pneumonia.  Asymmetric pulmonary edema cannot be excluded.  Central mild vascular congestion.   Mild right perihilar interstitial prominence.  IMPRESSION:  There is widening  mid mediastinum and left hilar prominence. Hazy opacification of the left hemithorax may be due to effusion with associated atelectasis or diffuse infiltrates/pneumonia. Asymmetric pulmonary edema cannot be excluded.  Central mild vascular congestion.  Mild right perihilar interstitial prominence.   Original Report Authenticated By: Natasha Mead, M.D.     Scheduled Meds:   . antiseptic oral rinse  15 mL Mouth Rinse q12n4p  . baclofen  10 mg Oral QID  . chlorhexidine  15 mL Mouth Rinse BID  . divalproex  750 mg Oral Daily  . enoxaparin (LOVENOX) injection  40 mg Subcutaneous Q24H  . feeding supplement  237 mL Oral BID BM  . FLUoxetine  60 mg Oral BID  . guaiFENesin  600 mg Oral BID  . iron polysaccharides  150 mg Oral Daily  . latanoprost  1 drop Both Eyes QHS  . levETIRAcetam  1,000 mg Oral BID  . levofloxacin (LEVAQUIN) IV  750 mg Intravenous Q24H  . [COMPLETED] naLOXone (NARCAN)  injection  2 mg Intravenous Once  . [COMPLETED] ondansetron (ZOFRAN) IV  4 mg Intravenous Once  . oxybutynin  5 mg Oral QHS  . [COMPLETED] piperacillin-tazobactam (ZOSYN)  IV  3.375 g Intravenous Once  . piperacillin-tazobactam (ZOSYN)  IV  3.375 g Intravenous Q8H  . saccharomyces boulardii  250 mg Oral BID  . [COMPLETED] sodium chloride  1,000 mL Intravenous Once  . [COMPLETED] sodium chloride  500 mL Intravenous Once  . [COMPLETED] sodium chloride  500 mL Intravenous Once  . [COMPLETED] vancomycin  1,500 mg Intravenous Once  . vancomycin  1,000 mg Intravenous Q12H  . [DISCONTINUED] levetiracetam  1,000 mg Intravenous Q12H  . [DISCONTINUED] piperacillin-tazobactam (ZOSYN)  IV  3.375 g Intravenous Q8H  . [DISCONTINUED] piperacillin-tazobactam (ZOSYN)  IV  3.375 g Intravenous Q8H  . [DISCONTINUED] vancomycin  1,000 mg Intravenous Once   Continuous Infusions:   . sodium chloride 125 mL (07/13/12 1000)  . [DISCONTINUED] sodium  chloride 125 mL (07/12/12 1726)    ________________________________________________________________________  Time spent: 50 min    [facility]  [facility] Pager [phone] If 8PM-8AM, please contact night-coverage at [email or web address], password [facility] 07/13/2012, 10:19 AM  LOS: 1 day

## 2012-07-13 NOTE — Consult Note (Addendum)
WOC consult Note Reason for Consult: Consult requested for sacral and buttocks wounds.  Pt has history of plastic surgery to sate and is followed by [facility] once a month for assessment.  They have ordered daily wet to dry dressings to site, according to pt.  He has leaking suprapubic tube and has urinary infection according to primary team.  Pt states wound was assessed last month by plastic team and they reported it was looking better.  Suspect wound has declined while at SNF and constant leakage of infected urine pooled onto wound bed. Left outer ankle Deep tissue injury 1X1cm dark purple Left inner foot .5X.5cmX.1cm, 1X1X.1cm both red abrasions with mod yellow drainage, no odor.  Pt states they bumped his feet at the SNF. Right outer foot with Deep tissue injury .5X1cm dark purple and .5X.5cm dark purple. Wound type: Stage 4 wound according to progress notes, presently unstageable. Pt has colostomy pouch which is intact with good seal, mod brown semiformed stool in pouch.  Pouch not removed, supplies ordered to bedside for staff nurses.  Stoma appears red and viable when visualized through pouch. Pressure Ulcer POA: Yes Measurement: Difficult to measure accurately R/T multiple sites and pt cannot tolerate being on side very long.  9X12cm and 19X10cm across buttocks and sacrum  Previous scar tissue from plastic surgery is red and macerated and beginning to breakdown R/T constant moisture. Wound bed: 100% slough, mod tan drainage, some odor. Left ischium with .2X.3X6cm, swab inserted deeply palpates bone and appears to communicate with wound with slough nearby. Recommend urology consult to control leakage to suprapubic tube.  Recommend surgical consult to assess for need to debride extensive amt nonviable tissue to buttocks/sacrum.  Pt could benefit from CT scan to left ischium to R/O osteomyelitis since bone palpable.  Discussed plan of care with primary team.  Dressing  procedure/placement/frequency: Wet-to -dry dressing to sacrum and buttocks until further input available from surgical team.  Foam dressings to absorb drainage to BLE wounds.  Hell lift boots on to reduce pressure.  Pt on low air loss bed to redistribute pressure.  Educational handout given to pt regarding pressure ulcer etiology, treatment, and preventive measures.  Discussed status of pressure ulcer with sister on phone. Spent over an hour on the consult.   Cammie Mcgee, RN, MSN, [employer]  [phone]

## 2012-07-13 NOTE — Progress Notes (Addendum)
Nurse paged MD; pt constantly removing BIPAP machine, going between BIPAP and non re-breather, on BIPAP per respiratory patient settings  18/8 100% and only moving volumes in 150's.  Vital signs are documented.  Pt back on non re-breather and does not appear to be in distress oxygen saturations low 90's.  Nurse will continue to monitor.

## 2012-07-13 NOTE — Progress Notes (Signed)
2050: Dr. Kendrick Fries in room with pt. Acknowledge ABG results to pt and discussing events of day.   Update to this nurse about discussion on being DNR and plan of care. ~2130: McQuaid updated nurse on phone call to family and plan of care. Will update [facility]. 2230: NP called, updated, discussed pt plan of care. Will continue to monitor closely.

## 2012-07-13 NOTE — Progress Notes (Signed)
Paged MD to inform pt back on BIPAP and remains in distress oxygen saturation high 80's.

## 2012-07-13 NOTE — Consult Note (Signed)
[facility]    Date of Admission:  07/12/2012  Date of Consult:  07/13/2012  Reason for Consult: Sepsis Referring Physician: Dr. Butler Denmark  HPI: [name] is an 61 y.o. 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 He had poor quality admission CXR read as possible pneumonia, UA large leuk, 11-20 wbc. Blood cultures taken. He was placed on broad spectrum antibiotics in the form of vancomycin Zosyn and levofloxacin. Overnight his hemodynamics have stabilized and he feels much better is much more responsive and alert and oriented. He is been seen by wound care who found large decubitus ulcers including one or the Left ischium with .2X.3X6cm, swab inserted deeply palpates bone and appears to communicate with wound with slough nearby. Is also been concern that his urine from his suprapubic catheter is been leaking and draining into his large decubitus ulcer. He has been seen by critical care medicine as well as [place] [facility]. We are consulted to assist with the management of his acute septic episode and with workup of source of this sepsis with particular concern about his decubitus ulcer and possible underlying osteomyelitis. I was unable to examine the patient's decubitus ulcers today because of the time he was seen in the afternoon he had been placed back on the BiPAP machine.   Past Medical History  Diagnosis Date  . Coronary artery disease   . CHF (congestive heart failure)   . Paraplegia   . GERD (gastroesophageal reflux disease)   . Seizure     History reviewed. No pertinent past surgical history.ergies:   Allergies  Allergen Reactions  . Ciprofloxacin In  D5w Itching  . Minocycline Hcl Itching     Medications: I have reviewed patients current medications as documented in Epic Anti-infectives     Start     Dose/Rate Route Frequency Ordered Stop   07/13/12 0000   vancomycin (VANCOCIN) IVPB 1000 mg/200 mL premix        1,000 mg 200 mL/hr over 60 Minutes Intravenous Every 12 hours 07/12/12 1429     07/12/12 2200   piperacillin-tazobactam (ZOSYN) IVPB 3.375 g  Status:  Discontinued        3.375 g 12.5 mL/hr over 240 Minutes Intravenous 3 times per day 07/12/12 1429 07/12/12 1939   07/12/12 2000  piperacillin-tazobactam (ZOSYN) IVPB 3.375 g       3.375 g 12.5 mL/hr over 240 Minutes Intravenous Every 8 hours 07/12/12 1939     07/12/12 1745   levofloxacin (LEVAQUIN) IVPB 750 mg        750 mg 100 mL/hr over 90 Minutes Intravenous Every 24 hours 07/12/12 1711     07/12/12 1715   piperacillin-tazobactam (ZOSYN) IVPB 3.375 g  Status:  Discontinued        3.375 g 12.5 mL/hr over 240 Minutes Intravenous 3 times per day 07/12/12 1711 07/12/12 1717   07/12/12 1430   vancomycin (VANCOCIN) 1,500 mg in sodium chloride 0.9 % 500 mL IVPB        1,500 mg 250 mL/hr over 120 Minutes Intravenous  Once 07/12/12 1429 07/12/12 1652   07/12/12 1400   vancomycin (VANCOCIN) IVPB 1000 mg/200 mL premix  Status:  Discontinued  1,000 mg 200 mL/hr over 60 Minutes Intravenous  Once 07/12/12 1353 07/12/12 1427   07/12/12 1400  piperacillin-tazobactam (ZOSYN) IVPB 3.375 g       3.375 g 12.5 mL/hr over 240 Minutes Intravenous  Once 07/12/12 1353 07/12/12 1453          Social History:  reports that he has never smoked. He does not have any smokeless tobacco history on file. He reports that he does not drink alcohol or use illicit drugs.  Family History  Problem Relation Age of Onset  . Heart failure Mother   . Heart failure Father     As in HPI and primary teams notes otherwise 12 point review of systems is negative  Blood pressure 125/49, pulse  91, temperature 98.4 F (36.9 C), temperature source Axillary, resp. rate 23, height 6' 0.84" (1.85 m), weight 224 lb 13.9 oz (102 kg), SpO2 93.00%. General: Alert and awake, oriented x3, on BiPAP. HEENT: anicteric sclera, pupils reactive to light and accommodation, EOMI, oropharynx clear and without exudate CVS regular rate, normal r,  no murmur rubs or gallops Chest: Rhonchi heard. Abdomen: soft nontender, nondistended, normal bowel sounds, S2 with dark pasty stool. Suprapubic catheter placement in place. Skin: I did not examine the the decubitus ulcers  today. Neuro: Paraplegia  Results for orders placed during the hospital encounter of 07/12/12 (from the past 48 hour(s))  CBC WITH DIFFERENTIAL     Status: Abnormal   Collection Time   07/12/12 12:09 PM      Component Value Range Comment   WBC 15.4 (*) 4.0 - 10.5 K/uL    RBC 4.11 (*) 4.22 - 5.81 MIL/uL    Hemoglobin 10.0 (*) 13.0 - 17.0 g/dL    HCT 16.1 (*) 09.6 - 52.0 %    MCV 80.5  78.0 - 100.0 fL    MCH 24.3 (*) 26.0 - 34.0 pg    MCHC 30.2  30.0 - 36.0 g/dL    RDW 04.5 (*) 40.9 - 15.5 %    Platelets 541 (*) 150 - 400 K/uL    Neutrophils Relative 82 (*) 43 - 77 %    Neutro Abs 12.7 (*) 1.7 - 7.7 K/uL    Lymphocytes Relative 9 (*) 12 - 46 %    Lymphs Abs 1.4  0.7 - 4.0 K/uL    Monocytes Relative 8  3 - 12 %    Monocytes Absolute 1.3 (*) 0.1 - 1.0 K/uL    Eosinophils Relative 0  0 - 5 %    Eosinophils Absolute 0.0  0.0 - 0.7 K/uL    Basophils Relative 0  0 - 1 %    Basophils Absolute 0.0  0.0 - 0.1 K/uL   COMPREHENSIVE METABOLIC PANEL     Status: Abnormal   Collection Time   07/12/12 12:09 PM      Component Value Range Comment   Sodium 134 (*) 135 - 145 mEq/L    Potassium NOT DONE  3.5 - 5.1 mEq/L    Chloride 94 (*) 96 - 112 mEq/L    CO2 26  19 - 32 mEq/L    Glucose, Bld 123 (*) 70 - 99 mg/dL    BUN 37 (*) 6 - 23 mg/dL    Creatinine, Ser 8.11  0.50 - 1.35 mg/dL    Calcium 8.9  8.4 - 91.4 mg/dL    Total Protein 8.2  6.0 -  8.3 g/dL    Albumin 2.2 (*) 3.5 - 5.2 g/dL  AST 80 (*) 0 - 37 U/L    ALT NOT DONE  0 - 53 U/L    Alkaline Phosphatase NOT DONE  39 - 117 U/L    Total Bilirubin 0.3  0.3 - 1.2 mg/dL    GFR calc non Af Amer 55 (*) >90 mL/min    GFR calc Af Amer 64 (*) >90 mL/min   VALPROIC ACID LEVEL     Status: Abnormal   Collection Time   07/12/12 12:09 PM      Component Value Range Comment   Valproic Acid Lvl 35.5 (*) 50.0 - 100.0 ug/mL   CULTURE, BLOOD (ROUTINE X 2)     Status: Normal (Preliminary result)   Collection Time   07/12/12 12:20 PM      Component Value Range Comment   Specimen Description BLOOD ARM RIGHT      Special Requests BOTTLES DRAWN AEROBIC ONLY      Culture  Setup Time 07/12/2012 21:55      Culture        Value:        BLOOD CULTURE RECEIVED NO GROWTH TO DATE CULTURE WILL BE HELD FOR 5 DAYS BEFORE ISSUING A FINAL NEGATIVE REPORT   Report Status PENDING     CG4 I-STAT (LACTIC ACID)     Status: Normal   Collection Time   07/12/12 12:30 PM      Component Value Range Comment   Lactic Acid, Venous 1.20  0.5 - 2.2 mmol/L   POCT I-STAT TROPONIN I     Status: Normal   Collection Time   07/12/12 12:32 PM      Component Value Range Comment   Troponin i, poc 0.04  0.00 - 0.08 ng/mL    Comment 3            POCT I-STAT 3, BLOOD GAS (G3+)     Status: Abnormal   Collection Time   07/12/12 12:37 PM      Component Value Range Comment   pH, Arterial 7.306 (*) 7.350 - 7.450    pCO2 arterial 60.6 (*) 35.0 - 45.0 mmHg    pO2, Arterial 64.0 (*) 80.0 - 100.0 mmHg    Bicarbonate 30.3 (*) 20.0 - 24.0 mEq/L    TCO2 32  0 - 100 mmol/L    O2 Saturation 89.0      Acid-Base Excess 3.0 (*) 0.0 - 2.0 mmol/L    Patient temperature 98.6 F      Collection site RADIAL, ALLEN'S TEST ACCEPTABLE      Drawn by RT      Sample type ARTERIAL      Comment NOTIFIED PHYSICIAN     CULTURE, BLOOD (ROUTINE X 2)     Status: Normal (Preliminary result)   Collection Time   07/12/12 12:44 PM      Component Value  Range Comment   Specimen Description BLOOD HAND RIGHT      Special Requests BOTTLES DRAWN AEROBIC ONLY 2CC      Culture  Setup Time 07/12/2012 16:52      Culture        Value:        BLOOD CULTURE RECEIVED NO GROWTH TO DATE CULTURE WILL BE HELD FOR 5 DAYS BEFORE ISSUING A FINAL NEGATIVE REPORT   Report Status PENDING     PRO B NATRIURETIC PEPTIDE     Status: Abnormal   Collection Time   07/12/12  1:20 PM      Component Value Range Comment  Pro B Natriuretic peptide (BNP) 3087.0 (*) 0 - 125 pg/mL   COMPREHENSIVE METABOLIC PANEL     Status: Abnormal   Collection Time   07/12/12  1:35 PM      Component Value Range Comment   Sodium 139  135 - 145 mEq/L    Potassium 5.7 (*) 3.5 - 5.1 mEq/L    Chloride 100  96 - 112 mEq/L    CO2 29  19 - 32 mEq/L    Glucose, Bld 112 (*) 70 - 99 mg/dL    BUN 37 (*) 6 - 23 mg/dL    Creatinine, Ser 1.61 (*) 0.50 - 1.35 mg/dL    Calcium 8.3 (*) 8.4 - 10.5 mg/dL    Total Protein 7.3  6.0 - 8.3 g/dL    Albumin 2.1 (*) 3.5 - 5.2 g/dL    AST 17  0 - 37 U/L    ALT 29  0 - 53 U/L    Alkaline Phosphatase 152 (*) 39 - 117 U/L    Total Bilirubin 0.2 (*) 0.3 - 1.2 mg/dL    GFR calc non Af Amer 50 (*) >90 mL/min    GFR calc Af Amer 58 (*) >90 mL/min   URINALYSIS, ROUTINE W REFLEX MICROSCOPIC     Status: Abnormal   Collection Time   07/12/12  2:10 PM      Component Value Range Comment   Color, Urine GREEN (*) YELLOW BIOCHEMICALS MAY BE AFFECTED BY COLOR   APPearance TURBID (*) CLEAR    Specific Gravity, Urine 1.025  1.005 - 1.030    pH 8.5 (*) 5.0 - 8.0    Glucose, UA 100 (*) NEGATIVE mg/dL    Hgb urine dipstick NEGATIVE  NEGATIVE    Bilirubin Urine SMALL (*) NEGATIVE    Ketones, ur NEGATIVE  NEGATIVE mg/dL    Protein, ur >096 (*) NEGATIVE mg/dL    Urobilinogen, UA 0.2  0.0 - 1.0 mg/dL    Nitrite POSITIVE (*) NEGATIVE    Leukocytes, UA LARGE (*) NEGATIVE   URINE CULTURE     Status: Normal   Collection Time   07/12/12  2:10 PM      Component Value Range  Comment   Specimen Description URINE, CLEAN CATCH      Special Requests NONE      Culture  Setup Time 07/12/2012 15:02      Colony Count 40,000 COLONIES/ML      Culture        Value: Multiple bacterial morphotypes present, none predominant. Suggest appropriate recollection if clinically indicated.   Report Status 07/13/2012 FINAL     URINE MICROSCOPIC-ADD ON     Status: Abnormal   Collection Time   07/12/12  2:10 PM      Component Value Range Comment   WBC, UA 11-20  <3 WBC/hpf    Bacteria, UA MANY (*) RARE    Casts GRANULAR CAST (*) NEGATIVE    Crystals TRIPLE PHOSPHATE CRYSTALS (*) NEGATIVE    Urine-Other AMORPHOUS URATES/PHOSPHATES     MRSA PCR SCREENING     Status: Normal   Collection Time   07/12/12  5:22 PM      Component Value Range Comment   MRSA by PCR NEGATIVE  NEGATIVE   COMPREHENSIVE METABOLIC PANEL     Status: Abnormal   Collection Time   07/13/12  5:00 AM      Component Value Range Comment   Sodium 137  135 - 145 mEq/L    Potassium  5.4 (*) 3.5 - 5.1 mEq/L    Chloride 102  96 - 112 mEq/L    CO2 24  19 - 32 mEq/L    Glucose, Bld 95  70 - 99 mg/dL    BUN 34 (*) 6 - 23 mg/dL    Creatinine, Ser 1.61  0.50 - 1.35 mg/dL    Calcium 8.1 (*) 8.4 - 10.5 mg/dL    Total Protein 7.1  6.0 - 8.3 g/dL    Albumin 1.8 (*) 3.5 - 5.2 g/dL    AST 21  0 - 37 U/L    ALT 25  0 - 53 U/L    Alkaline Phosphatase 137 (*) 39 - 117 U/L    Total Bilirubin 0.2 (*) 0.3 - 1.2 mg/dL    GFR calc non Af Amer 67 (*) >90 mL/min    GFR calc Af Amer 78 (*) >90 mL/min   CBC     Status: Abnormal   Collection Time   07/13/12  5:00 AM      Component Value Range Comment   WBC 16.5 (*) 4.0 - 10.5 K/uL    RBC 3.61 (*) 4.22 - 5.81 MIL/uL    Hemoglobin 8.9 (*) 13.0 - 17.0 g/dL    HCT 09.6 (*) 04.5 - 52.0 %    MCV 82.8  78.0 - 100.0 fL    MCH 24.7 (*) 26.0 - 34.0 pg    MCHC 29.8 (*) 30.0 - 36.0 g/dL    RDW 40.9 (*) 81.1 - 15.5 %    Platelets 504 (*) 150 - 400 K/uL   PROCALCITONIN     Status: Normal    Collection Time   07/13/12  1:11 PM      Component Value Range Comment   Procalcitonin 0.21         Component Value Date/Time   SDES URINE, CLEAN CATCH 07/12/2012 1410   SPECREQUEST NONE 07/12/2012 1410   CULT Multiple bacterial morphotypes present, none predominant. Suggest appropriate recollection if clinically indicated. 07/12/2012 1410   REPTSTATUS 07/13/2012 FINAL 07/12/2012 1410   Ct Head Wo Contrast  07/12/2012  *RADIOLOGY REPORT*  Clinical Data:  Altered mental status  CT HEAD WITHOUT CONTRAST  Technique:  Contiguous axial images were obtained from the base of the skull through the vertex without contrast. Study was obtained within 24 hours of patient arrival at the emergency department.  Comparison: None.  Findings: There is mild diffuse atrophy.  There is no mass, hemorrhage, extra-axial fluid collection, or midline shift.  No focal gray-white compartment lesions are identified.  No acute infarct is appreciable on this study.  Bony calvarium appears intact.  The mastoid air cells are clear.  IMPRESSION: Mild atrophy.  Study otherwise unremarkable.   Original Report Authenticated By: Bretta Bang, M.D.    Dg Chest Port 1 View  07/12/2012  *RADIOLOGY REPORT*  Clinical Data: Unresponsive, pain  PORTABLE CHEST - 1 VIEW  Comparison: 05/06/2010  Findings: Cardiac size is stable.  There is widening  mid mediastinum and left hilar prominence.  Hazy opacification of the left hemithorax may be due to effusion with associated atelectasis or diffuse infiltrates/pneumonia.  Asymmetric pulmonary edema cannot be excluded.  Central mild vascular congestion.  Mild right perihilar interstitial prominence.  IMPRESSION:  There is widening  mid mediastinum and left hilar prominence. Hazy opacification of the left hemithorax may be due to effusion with associated atelectasis or diffuse infiltrates/pneumonia. Asymmetric pulmonary edema cannot be excluded.  Central mild vascular congestion.  Mild right  perihilar  interstitial prominence.   Original Report Authenticated By: Natasha Mead, M.D.      Recent Results (from the past 720 hour(s))  CULTURE, BLOOD (ROUTINE X 2)     Status: Normal (Preliminary result)   Collection Time   07/12/12 12:20 PM      Component Value Range Status Comment   Specimen Description BLOOD ARM RIGHT   Final    Special Requests BOTTLES DRAWN AEROBIC ONLY   Final    Culture  Setup Time 07/12/2012 21:55   Final    Culture     Final    Value:        BLOOD CULTURE RECEIVED NO GROWTH TO DATE CULTURE WILL BE HELD FOR 5 DAYS BEFORE ISSUING A FINAL NEGATIVE REPORT   Report Status PENDING   Incomplete   CULTURE, BLOOD (ROUTINE X 2)     Status: Normal (Preliminary result)   Collection Time   07/12/12 12:44 PM      Component Value Range Status Comment   Specimen Description BLOOD HAND RIGHT   Final    Special Requests BOTTLES DRAWN AEROBIC ONLY 2CC   Final    Culture  Setup Time 07/12/2012 16:52   Final    Culture     Final    Value:        BLOOD CULTURE RECEIVED NO GROWTH TO DATE CULTURE WILL BE HELD FOR 5 DAYS BEFORE ISSUING A FINAL NEGATIVE REPORT   Report Status PENDING   Incomplete   URINE CULTURE     Status: Normal   Collection Time   07/12/12  2:10 PM      Component Value Range Status Comment   Specimen Description URINE, CLEAN CATCH   Final    Special Requests NONE   Final    Culture  Setup Time 07/12/2012 15:02   Final    Colony Count 40,000 COLONIES/ML   Final    Culture     Final    Value: Multiple bacterial morphotypes present, none predominant. Suggest appropriate recollection if clinically indicated.   Report Status 07/13/2012 FINAL   Final   MRSA PCR SCREENING     Status: Normal   Collection Time   07/12/12  5:22 PM      Component Value Range Status Comment   MRSA by PCR NEGATIVE  NEGATIVE Final      Impression/Recommendation  61 year old gentleman with multiple medical problems paraplegia a large decubitus ulcers status post debridement at [facility] having been on recent Augmentin and Zyvox, admitted with acute apparent sepsis with hypotensive and confusion that has responded well to broad-spectrum antibiotics in the form of vancomycin Zosyn and levofloxacin. Her multiple possible sources including his lungs and his deep decubitus ulcer that likely goes to bone.  #1 sepsis: He has responded well to broad-spectrum antibiotics -- I will de escalate slightly and discontinue his levofloxacin --Follow up blood cultures --A repeat chest x-ray --will further investigate his decubitus ulcers when he is more stable.  #2 possible pneumonia: Repeat chest x-ray continue broad-spectrum antibiotics with vancomycin and Zosyn but tdeescalate and discontinue the levofloxacin.  #3 stage IV decubitus ulcer with tracking to bone:  --Obtain records from [place] where they performed incision and debridement and worries had plastic surgery apparently -- He is also been seen at [facility].  --He appears to have leakage of his suprapubic catheter into this wound he'll need assistance in urology in addition to general surgery was R. to following him along  with wound care. --I. would consider imaging of his abdomen pelvis with CT or MRI when he is clinically stable. 10 obtain records from the outside hospital is critical.      Thank you so much for this interesting consult  [facility] [phone] (pager) [phone] (office) 07/13/2012, 4:43 PM  Paulette Blanch Dam 07/13/2012, 4:43 PM

## 2012-07-13 NOTE — Progress Notes (Signed)
Patient has been off and on BIPAP. Patient refuses to keep mask on, when on BIPAP his settings are 18/8, with him only pulling volumes of 150. Patient is currently on 100% NRB because he refuses to wear mask at this time

## 2012-07-13 NOTE — Consult Note (Signed)
Reason for Consult:Sacral decubitis Referring Physician: Dhungel  [name] is an 61 y.o. male.  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  Patient was alert and oriented this am and complaining of cough . Around 11:30 am he was poorly responsive and lethargic and shaky. BP was 90/70  Patient at baseline is Well alert and oriented and able to communicate well. He is immobile however is able to turn in bed. As per the nurse he was started on Zyvox and Augmentin of 2 weeks back for possible infection over the coccyx and is still on it. He however did not have any fever. Patient was then transferred to [facility]. In the ED he was placed on BiPAP with a ABG showing hypercapnic respiratory failure. His blood pressure was 72/32 mmhg. her chest x-ray done showed hazy opacification of left hemithorax. [facility] and was called for admission to ICU. However given his multiple comorbidities, PC CM and discussed with patient's sister Maureen Ralphs who recommended to make him DO NOT RESUSCITATE and possible comfort care. Patient was given a dose of IV vancomycin and Zosyn in the ED along with IV fluids and recommended to be admited to step down unit under triad hospitalist care.  Patient did not complain of any chest pain, shortness of breath, palpitations, abdominal pain, headache, nausea or vomiting. He did not have any diarrhea fever or chills. Current WBC is 16.5 with left shift, Tmax is 98.5 on admission. BNP is 3087, Lactic acid was 1.20, UA is positive for urinary tract infection. Patients MRSA  culture was negative.  Blood cultures were drawn and are pending.  We have been asked by WOC RN to see the patient on consult for his extensive decubitus wound, and for possible debridement.  Past Medical History  Diagnosis Date  . Coronary artery disease   . CHF (congestive heart failure)   . Paraplegia   . GERD (gastroesophageal reflux disease)   . Seizure     History reviewed. No pertinent past surgical history.  Family History  Problem Relation Age of Onset  . Heart failure Mother   . Heart failure Father     Social History:  reports that he has never smoked. He does not have any smokeless tobacco history on file. He reports that he does not drink alcohol or use illicit drugs.  Allergies:  Allergies  Allergen Reactions  . Ciprofloxacin In D5w Itching  . Minocycline Hcl Itching    Medications: I have reviewed the patient's current medications.  Results for orders placed during the hospital encounter of 07/12/12 (from the past 48 hour(s))  CBC WITH DIFFERENTIAL     Status: Abnormal   Collection Time   07/12/12 12:09 PM      Component Value Range Comment   WBC 15.4 (*) 4.0 - 10.5 K/uL    RBC 4.11 (*) 4.22 - 5.81 MIL/uL    Hemoglobin 10.0 (*) 13.0 - 17.0 g/dL    HCT 16.1 (*) 09.6 - 52.0 %    MCV 80.5  78.0 - 100.0 fL    MCH 24.3 (*) 26.0 - 34.0 pg    MCHC 30.2  30.0 -  36.0 g/dL    RDW 16.1 (*) 09.6 - 15.5 %    Platelets 541 (*) 150 - 400 K/uL    Neutrophils Relative 82 (*) 43 - 77 %    Neutro Abs 12.7 (*) 1.7 - 7.7 K/uL    Lymphocytes Relative 9 (*) 12 - 46 %    Lymphs Abs 1.4  0.7 - 4.0 K/uL    Monocytes Relative 8  3 - 12 %    Monocytes Absolute 1.3 (*) 0.1 - 1.0 K/uL    Eosinophils Relative 0  0 - 5 %    Eosinophils Absolute 0.0  0.0 - 0.7 K/uL    Basophils Relative 0  0 - 1 %    Basophils Absolute 0.0  0.0 - 0.1 K/uL   COMPREHENSIVE METABOLIC PANEL     Status: Abnormal   Collection Time   07/12/12 12:09 PM      Component Value Range Comment   Sodium 134  (*) 135 - 145 mEq/L    Potassium NOT DONE  3.5 - 5.1 mEq/L    Chloride 94 (*) 96 - 112 mEq/L    CO2 26  19 - 32 mEq/L    Glucose, Bld 123 (*) 70 - 99 mg/dL    BUN 37 (*) 6 - 23 mg/dL    Creatinine, Ser 0.45  0.50 - 1.35 mg/dL    Calcium 8.9  8.4 - 40.9 mg/dL    Total Protein 8.2  6.0 - 8.3 g/dL    Albumin 2.2 (*) 3.5 - 5.2 g/dL    AST 80 (*) 0 - 37 U/L    ALT NOT DONE  0 - 53 U/L    Alkaline Phosphatase NOT DONE  39 - 117 U/L    Total Bilirubin 0.3  0.3 - 1.2 mg/dL    GFR calc non Af Amer 55 (*) >90 mL/min    GFR calc Af Amer 64 (*) >90 mL/min   VALPROIC ACID LEVEL     Status: Abnormal   Collection Time   07/12/12 12:09 PM      Component Value Range Comment   Valproic Acid Lvl 35.5 (*) 50.0 - 100.0 ug/mL   CULTURE, BLOOD (ROUTINE X 2)     Status: Normal (Preliminary result)   Collection Time   07/12/12 12:20 PM      Component Value Range Comment   Specimen Description BLOOD ARM RIGHT      Special Requests BOTTLES DRAWN AEROBIC ONLY      Culture  Setup Time 07/12/2012 21:55      Culture        Value:        BLOOD CULTURE RECEIVED NO GROWTH TO DATE CULTURE WILL BE HELD FOR 5 DAYS BEFORE ISSUING A FINAL NEGATIVE REPORT   Report Status PENDING     CG4 I-STAT (LACTIC ACID)     Status: Normal   Collection Time   07/12/12 12:30 PM      Component Value Range Comment   Lactic Acid, Venous 1.20  0.5 - 2.2 mmol/L   POCT I-STAT TROPONIN I     Status: Normal   Collection Time   07/12/12 12:32 PM      Component Value Range Comment   Troponin i, poc 0.04  0.00 - 0.08 ng/mL    Comment 3            POCT I-STAT 3, BLOOD GAS (G3+)     Status: Abnormal   Collection Time   07/12/12  12:37 PM      Component Value Range Comment   pH, Arterial 7.306 (*) 7.350 - 7.450    pCO2 arterial 60.6 (*) 35.0 - 45.0 mmHg    pO2, Arterial 64.0 (*) 80.0 - 100.0 mmHg    Bicarbonate 30.3 (*) 20.0 - 24.0 mEq/L    TCO2 32  0 - 100 mmol/L    O2 Saturation 89.0      Acid-Base Excess 3.0 (*) 0.0 - 2.0 mmol/L      Patient temperature 98.6 F      Collection site RADIAL, ALLEN'S TEST ACCEPTABLE      Drawn by RT      Sample type ARTERIAL      Comment NOTIFIED PHYSICIAN     CULTURE, BLOOD (ROUTINE X 2)     Status: Normal (Preliminary result)   Collection Time   07/12/12 12:44 PM      Component Value Range Comment   Specimen Description BLOOD HAND RIGHT      Special Requests BOTTLES DRAWN AEROBIC ONLY 2CC      Culture  Setup Time 07/12/2012 16:52      Culture        Value:        BLOOD CULTURE RECEIVED NO GROWTH TO DATE CULTURE WILL BE HELD FOR 5 DAYS BEFORE ISSUING A FINAL NEGATIVE REPORT   Report Status PENDING     PRO B NATRIURETIC PEPTIDE     Status: Abnormal   Collection Time   07/12/12  1:20 PM      Component Value Range Comment   Pro B Natriuretic peptide (BNP) 3087.0 (*) 0 - 125 pg/mL   COMPREHENSIVE METABOLIC PANEL     Status: Abnormal   Collection Time   07/12/12  1:35 PM      Component Value Range Comment   Sodium 139  135 - 145 mEq/L    Potassium 5.7 (*) 3.5 - 5.1 mEq/L    Chloride 100  96 - 112 mEq/L    CO2 29  19 - 32 mEq/L    Glucose, Bld 112 (*) 70 - 99 mg/dL    BUN 37 (*) 6 - 23 mg/dL    Creatinine, Ser 4.54 (*) 0.50 - 1.35 mg/dL    Calcium 8.3 (*) 8.4 - 10.5 mg/dL    Total Protein 7.3  6.0 - 8.3 g/dL    Albumin 2.1 (*) 3.5 - 5.2 g/dL    AST 17  0 - 37 U/L    ALT 29  0 - 53 U/L    Alkaline Phosphatase 152 (*) 39 - 117 U/L    Total Bilirubin 0.2 (*) 0.3 - 1.2 mg/dL    GFR calc non Af Amer 50 (*) >90 mL/min    GFR calc Af Amer 58 (*) >90 mL/min   URINALYSIS, ROUTINE W REFLEX MICROSCOPIC     Status: Abnormal   Collection Time   07/12/12  2:10 PM      Component Value Range Comment   Color, Urine GREEN (*) YELLOW BIOCHEMICALS MAY BE AFFECTED BY COLOR   APPearance TURBID (*) CLEAR    Specific Gravity, Urine 1.025  1.005 - 1.030    pH 8.5 (*) 5.0 - 8.0    Glucose, UA 100 (*) NEGATIVE mg/dL    Hgb urine dipstick NEGATIVE  NEGATIVE    Bilirubin Urine SMALL (*) NEGATIVE     Ketones, ur NEGATIVE  NEGATIVE mg/dL    Protein, ur >098 (*) NEGATIVE mg/dL    Urobilinogen, UA  0.2  0.0 - 1.0 mg/dL    Nitrite POSITIVE (*) NEGATIVE    Leukocytes, UA LARGE (*) NEGATIVE   URINE MICROSCOPIC-ADD ON     Status: Abnormal   Collection Time   07/12/12  2:10 PM      Component Value Range Comment   WBC, UA 11-20  <3 WBC/hpf    Bacteria, UA MANY (*) RARE    Casts GRANULAR CAST (*) NEGATIVE    Crystals TRIPLE PHOSPHATE CRYSTALS (*) NEGATIVE    Urine-Other AMORPHOUS URATES/PHOSPHATES     MRSA PCR SCREENING     Status: Normal   Collection Time   07/12/12  5:22 PM      Component Value Range Comment   MRSA by PCR NEGATIVE  NEGATIVE   COMPREHENSIVE METABOLIC PANEL     Status: Abnormal   Collection Time   07/13/12  5:00 AM      Component Value Range Comment   Sodium 137  135 - 145 mEq/L    Potassium 5.4 (*) 3.5 - 5.1 mEq/L    Chloride 102  96 - 112 mEq/L    CO2 24  19 - 32 mEq/L    Glucose, Bld 95  70 - 99 mg/dL    BUN 34 (*) 6 - 23 mg/dL    Creatinine, Ser 4.54  0.50 - 1.35 mg/dL    Calcium 8.1 (*) 8.4 - 10.5 mg/dL    Total Protein 7.1  6.0 - 8.3 g/dL    Albumin 1.8 (*) 3.5 - 5.2 g/dL    AST 21  0 - 37 U/L    ALT 25  0 - 53 U/L    Alkaline Phosphatase 137 (*) 39 - 117 U/L    Total Bilirubin 0.2 (*) 0.3 - 1.2 mg/dL    GFR calc non Af Amer 67 (*) >90 mL/min    GFR calc Af Amer 78 (*) >90 mL/min   CBC     Status: Abnormal   Collection Time   07/13/12  5:00 AM      Component Value Range Comment   WBC 16.5 (*) 4.0 - 10.5 K/uL    RBC 3.61 (*) 4.22 - 5.81 MIL/uL    Hemoglobin 8.9 (*) 13.0 - 17.0 g/dL    HCT 09.8 (*) 11.9 - 52.0 %    MCV 82.8  78.0 - 100.0 fL    MCH 24.7 (*) 26.0 - 34.0 pg    MCHC 29.8 (*) 30.0 - 36.0 g/dL    RDW 14.7 (*) 82.9 - 15.5 %    Platelets 504 (*) 150 - 400 K/uL     Ct Head Wo Contrast  07/12/2012  *RADIOLOGY REPORT*  Clinical Data:  Altered mental status  CT HEAD WITHOUT CONTRAST  Technique:  Contiguous axial images were obtained from the base  of the skull through the vertex without contrast. Study was obtained within 24 hours of patient arrival at the emergency department.  Comparison: None.  Findings: There is mild diffuse atrophy.  There is no mass, hemorrhage, extra-axial fluid collection, or midline shift.  No focal gray-white compartment lesions are identified.  No acute infarct is appreciable on this study.  Bony calvarium appears intact.  The mastoid air cells are clear.  IMPRESSION: Mild atrophy.  Study otherwise unremarkable.   Original Report Authenticated By: Bretta Bang, M.D.    Dg Chest Port 1 View  07/12/2012  *RADIOLOGY REPORT*  Clinical Data: Unresponsive, pain  PORTABLE CHEST - 1 VIEW  Comparison: 05/06/2010  Findings: Cardiac  size is stable.  There is widening  mid mediastinum and left hilar prominence.  Hazy opacification of the left hemithorax may be due to effusion with associated atelectasis or diffuse infiltrates/pneumonia.  Asymmetric pulmonary edema cannot be excluded.  Central mild vascular congestion.  Mild right perihilar interstitial prominence.  IMPRESSION:  There is widening  mid mediastinum and left hilar prominence. Hazy opacification of the left hemithorax may be due to effusion with associated atelectasis or diffuse infiltrates/pneumonia. Asymmetric pulmonary edema cannot be excluded.  Central mild vascular congestion.  Mild right perihilar interstitial prominence.   Original Report Authenticated By: Natasha Mead, M.D.     Review of Systems  Constitutional: Negative for fever, chills, weight loss, malaise/fatigue and diaphoresis.       Patient is a paraplegic secondary to fall in 1970 T7-T8.  HENT: Negative.   Eyes: Negative.   Respiratory: Positive for cough and shortness of breath. Negative for hemoptysis, sputum production and wheezing.   Cardiovascular: Positive for orthopnea. Negative for chest pain, palpitations, claudication, leg swelling and PND.  Gastrointestinal: Negative.        Patient has  ostomy  Genitourinary: Positive for dysuria.       Patient has urostomy tube  Musculoskeletal: Negative.   Skin: Negative for itching and rash.  Neurological: Negative.  Negative for weakness.  Endo/Heme/Allergies: Negative.   Psychiatric/Behavioral: Negative.    Blood pressure 119/55, pulse 90, temperature 98.2 F (36.8 C), temperature source Axillary, resp. rate 26, height 6' 0.84" (1.85 m), weight 224 lb 13.9 oz (102 kg), SpO2 91.00%. Physical Exam  Constitutional: He is oriented to person, place, and time. He appears well-developed and well-nourished. No distress.  HENT:  Head: Normocephalic and atraumatic.  Mouth/Throat: No oropharyngeal exudate.  Eyes: Conjunctivae normal and EOM are normal. Pupils are equal, round, and reactive to light. Right eye exhibits no discharge. Left eye exhibits no discharge. No scleral icterus.  Neck: Normal range of motion. Neck supple. No JVD present. No tracheal deviation present. No thyromegaly present.  Cardiovascular: Normal rate, regular rhythm, normal heart sounds and intact distal pulses.  Exam reveals no gallop and no friction rub.   No murmur heard. Respiratory: Effort normal and breath sounds normal. No stridor. No respiratory distress. He has no wheezes. He has no rales. He exhibits no tenderness.       Currently on partial rebreather mask  GI: Soft. Bowel sounds are normal. He exhibits no distension and no mass. There is no tenderness. There is no rebound and no guarding.  Genitourinary:       Urostomy tube in place  Musculoskeletal:       Paraplegic T7-T8 level  Lymphadenopathy:    He has no cervical adenopathy.  Neurological: He is alert and oriented to person, place, and time.  Skin: Skin is warm and dry. No rash noted. He is not diaphoretic. There is erythema. There is pallor.     Psychiatric: He has a normal mood and affect.    Assessment/Plan: Patient Active Problem List  Diagnosis  . Acute respiratory failure  . Septic  shock  . Altered mental status  . H/O CHF  . Lower paraplegia  . Seizure disorder  . Decubitus ulcer of coccygeal region  . Hyperkalemia  . Anemia  . Healthcare-associated pneumonia  . Hypotension  . Hypertonic bladder   1. Continue wound care as per [facility] RN recommendations 2. Will  order hydrotherapy and enzymatic therapy (santyl) to assist with debridement of his extensive necrotic areas. Patient  will likely need bedside debridement at some point in the future. 3. Agree with Continuing ABX, IVF.  Follow labs 4. Management of his other medical issues per CCM 5. We will continue to follow along with you   Golda Acre [facility] Surgery Pager # [phone] 07/13/2012, 1:39 PM

## 2012-07-13 NOTE — Progress Notes (Signed)
Noted no urine output. Attempted to flush, and pull back urine received crystallized substance. Attempted by 2nd nurse, palpated crystallized substance in tubing as well as in syringe. Urine leaking from around suprapubic site.  Pt complaining of SOB. Pt becoming more tachypneic 30RR. Sats at 90-91% on Non-rebreather, 10L.  0045: NP text page.  RT placed on bipap.

## 2012-07-14 DIAGNOSIS — L8990 Pressure ulcer of unspecified site, unspecified stage: Secondary | ICD-10-CM

## 2012-07-14 DIAGNOSIS — R4182 Altered mental status, unspecified: Secondary | ICD-10-CM

## 2012-07-14 DIAGNOSIS — J9600 Acute respiratory failure, unspecified whether with hypoxia or hypercapnia: Secondary | ICD-10-CM

## 2012-07-14 DIAGNOSIS — L89109 Pressure ulcer of unspecified part of back, unspecified stage: Secondary | ICD-10-CM

## 2012-07-14 DIAGNOSIS — G8220 Paraplegia, unspecified: Secondary | ICD-10-CM

## 2012-07-14 DIAGNOSIS — J90 Pleural effusion, not elsewhere classified: Secondary | ICD-10-CM | POA: Diagnosis present

## 2012-07-14 LAB — BASIC METABOLIC PANEL
CO2: 27 mEq/L (ref 19–32)
Chloride: 106 mEq/L (ref 96–112)
Creatinine, Ser: 0.76 mg/dL (ref 0.50–1.35)
GFR calc Af Amer: >90 mL/min (ref >90)
Potassium: 5.1 mEq/L (ref 3.5–5.1)
Sodium: 143 mEq/L (ref 135–145)

## 2012-07-14 LAB — CBC
MCV: 85.8 fL (ref 78.0–100.0)
Platelets: 606 10*3/uL — ABNORMAL HIGH (ref 150–400)
RBC: 3.52 MIL/uL — ABNORMAL LOW (ref 4.22–5.81)
WBC: 14.3 10*3/uL — ABNORMAL HIGH (ref 4.0–10.5)

## 2012-07-14 LAB — VANCOMYCIN, TROUGH: Vancomycin Tr: 24.2 ug/mL — ABNORMAL HIGH (ref 10.0–20.0)

## 2012-07-14 MED ORDER — DIPHENHYDRAMINE HCL 50 MG/ML IJ SOLN
25.0000 mg | Freq: Once | INTRAMUSCULAR | Status: AC
  Administered 2012-07-15: 25 mg via INTRAVENOUS
  Filled 2012-07-14: qty 0.5

## 2012-07-14 MED ORDER — FUROSEMIDE 10 MG/ML IJ SOLN
80.0000 mg | Freq: Once | INTRAMUSCULAR | Status: AC
  Administered 2012-07-14: 80 mg via INTRAVENOUS
  Filled 2012-07-14: qty 8

## 2012-07-14 MED ORDER — OXYBUTYNIN CHLORIDE 5 MG PO TABS
5.0000 mg | ORAL_TABLET | Freq: Three times a day (TID) | ORAL | Status: DC
  Administered 2012-07-14 – 2012-07-19 (×16): 5 mg via ORAL
  Filled 2012-07-14 (×18): qty 1

## 2012-07-14 MED ORDER — FUROSEMIDE 10 MG/ML IJ SOLN
80.0000 mg | Freq: Four times a day (QID) | INTRAMUSCULAR | Status: DC
  Administered 2012-07-15 – 2012-07-19 (×18): 80 mg via INTRAVENOUS
  Filled 2012-07-14 (×21): qty 8
  Filled 2012-07-14: qty 4

## 2012-07-14 MED ORDER — VANCOMYCIN HCL 10 G IV SOLR
1250.0000 mg | INTRAVENOUS | Status: DC
  Administered 2012-07-15 – 2012-07-17 (×3): 1250 mg via INTRAVENOUS
  Filled 2012-07-14 (×3): qty 1250

## 2012-07-14 MED ORDER — OXYBUTYNIN CHLORIDE 5 MG PO TABS
5.0000 mg | ORAL_TABLET | Freq: Two times a day (BID) | ORAL | Status: DC
  Filled 2012-07-14 (×2): qty 1

## 2012-07-14 NOTE — Progress Notes (Signed)
Subjective: Resting quietly, on Bipap this morning, NAD.  Objective: Vital signs in last 24 hours: Temp:  [98.2 F (36.8 C)-98.6 F (37 C)] 98.3 F (36.8 C) (12/06 0828) Pulse Rate:  [69-102] 82  (12/06 0828) Resp:  [18-33] 18  (12/06 0828) BP: (96-150)/(42-62) 121/53 mmHg (12/06 0828) SpO2:  [82 %-97 %] 96 % (12/06 0828) FiO2 (%):  [80 %-100 %] 100 % (12/06 0828) Last BM Date: 07/13/12  Intake/Output from previous day: 12/05 0701 - 12/06 0700 In: 1750 [I.V.:1500; IV Piggyback:250] Out: -  Intake/Output this shift: Total I/O In: 125 [I.V.:125] Out: -   General appearance: alert, cooperative, appears stated age, no distress and morbidly obese Chest: CTA on Bipap sat's 96% Cardiac: RRR Sacral wound: extensive erythema and large areas of eschar (PT to begin hydrotherapy today, along with enzyme tx in an effort to soften tissue for any future debridement.) VSS, afebrile Labs: WBC have trended down slightly, H&H stable, BUN and creatine improved with IVF.  Lab Results:   Basename 07/14/12 0600 07/13/12 0500  WBC 14.3* 16.5*  HGB 8.7* 8.9*  HCT 30.2* 29.9*  PLT 606* 504*   BMET  Basename 07/14/12 0600 07/13/12 0500  NA 143 137  K 5.1 5.4*  CL 106 102  CO2 27 24  GLUCOSE 111* 95  BUN 31* 34*  CREATININE 0.76 1.15  CALCIUM 8.7 8.1*   PT/INR No results found for this basename: LABPROT:2,INR:2 in the last 72 hours ABG  Basename 07/13/12 2100 07/13/12 1914  PHART 7.239* 7.217*  HCO3 27.5* 27.2*    Studies/Results: Ct Head Wo Contrast  07/12/2012  *RADIOLOGY REPORT*  Clinical Data:  Altered mental status  CT HEAD WITHOUT CONTRAST  Technique:  Contiguous axial images were obtained from the base of the skull through the vertex without contrast. Study was obtained within 24 hours of patient arrival at the emergency department.  Comparison: None.  Findings: There is mild diffuse atrophy.  There is no mass, hemorrhage, extra-axial fluid collection, or midline shift.   No focal gray-white compartment lesions are identified.  No acute infarct is appreciable on this study.  Bony calvarium appears intact.  The mastoid air cells are clear.  IMPRESSION: Mild atrophy.  Study otherwise unremarkable.   Original Report Authenticated By: Bretta Bang, M.D.    Dg Chest Port 1 View  07/13/2012  *RADIOLOGY REPORT*  Clinical Data: Acute respiratory distress.  Left pleural effusion. Sepsis.  PORTABLE CHEST - 1 VIEW  Comparison: 07/12/2012  Findings: Large layering left pleural effusion is again seen as well as left lung atelectasis.  Low lung volumes again seen however right lung remains grossly clear.  Heart size remains stable. Mediastinal widening and tracheal deviation to the right is unchanged.  IMPRESSION:  1.  Large layering left pleural effusion on left lung atelectasis, without significant change. 2.  Stable mediastinal widening with tracheal deviation to the right.   Original Report Authenticated By: Myles Rosenthal, M.D.    Dg Chest Port 1 View  07/12/2012  *RADIOLOGY REPORT*  Clinical Data: Unresponsive, pain  PORTABLE CHEST - 1 VIEW  Comparison: 05/06/2010  Findings: Cardiac size is stable.  There is widening  mid mediastinum and left hilar prominence.  Hazy opacification of the left hemithorax may be due to effusion with associated atelectasis or diffuse infiltrates/pneumonia.  Asymmetric pulmonary edema cannot be excluded.  Central mild vascular congestion.  Mild right perihilar interstitial prominence.  IMPRESSION:  There is widening  mid mediastinum and left hilar prominence. Hazy opacification  of the left hemithorax may be due to effusion with associated atelectasis or diffuse infiltrates/pneumonia. Asymmetric pulmonary edema cannot be excluded.  Central mild vascular congestion.  Mild right perihilar interstitial prominence.   Original Report Authenticated By: Natasha Mead, M.D.     Anti-infectives: Anti-infectives     Start     Dose/Rate Route Frequency Ordered Stop    07/13/12 0000   vancomycin (VANCOCIN) IVPB 1000 mg/200 mL premix        1,000 mg 200 mL/hr over 60 Minutes Intravenous Every 12 hours 07/12/12 1429     07/12/12 2200   piperacillin-tazobactam (ZOSYN) IVPB 3.375 g  Status:  Discontinued        3.375 g 12.5 mL/hr over 240 Minutes Intravenous 3 times per day 07/12/12 1429 07/12/12 1939   07/12/12 2000  piperacillin-tazobactam (ZOSYN) IVPB 3.375 g       3.375 g 12.5 mL/hr over 240 Minutes Intravenous Every 8 hours 07/12/12 1939     07/12/12 1745   levofloxacin (LEVAQUIN) IVPB 750 mg  Status:  Discontinued        750 mg 100 mL/hr over 90 Minutes Intravenous Every 24 hours 07/12/12 1711 07/13/12 1654   07/12/12 1715   piperacillin-tazobactam (ZOSYN) IVPB 3.375 g  Status:  Discontinued        3.375 g 12.5 mL/hr over 240 Minutes Intravenous 3 times per day 07/12/12 1711 07/12/12 1717   07/12/12 1430   vancomycin (VANCOCIN) 1,500 mg in sodium chloride 0.9 % 500 mL IVPB        1,500 mg 250 mL/hr over 120 Minutes Intravenous  Once 07/12/12 1429 07/12/12 1652   07/12/12 1400   vancomycin (VANCOCIN) IVPB 1000 mg/200 mL premix  Status:  Discontinued        1,000 mg 200 mL/hr over 60 Minutes Intravenous  Once 07/12/12 1353 07/12/12 1427   07/12/12 1400  piperacillin-tazobactam (ZOSYN) IVPB 3.375 g       3.375 g 12.5 mL/hr over 240 Minutes Intravenous  Once 07/12/12 1353 07/12/12 1453          Assessment/Plan: s/p * No surgery found *   Patient Active Problem List  Diagnosis  . Acute respiratory failure  . Sepsis associated hypotension  . Altered mental status  . H/O CHF  . Lower paraplegia  . Seizure disorder  . Decubitus ulcer of coccygeal region  . Hyperkalemia  . Anemia  . Healthcare-associated pneumonia  . Hypotension  . Hypertonic bladder  . Colostomy in place    Plan:  !. Management per medicine for his medical needs 2. PT to do hydro therapy q day to sacral decubiti along with enzyme therapy in order to debride his  wound. 3. We will continue to follow along   LOS: 2 days    Golda Acre [facility] Surgery Pager # [phone]  07/14/2012

## 2012-07-14 NOTE — Progress Notes (Signed)
Pt placed on Nonrebreather per request. Pt sustaining sats 90+%.  0545: Pt begins to desat to 87% sustaining after turning. Pt refused bipap. Continue to monitor sats, sats 85% review course of decline if pt continues to refuse, pt verbalizes he understands and continues to refuse.  Pt continues to refuse bipap. Pt sats at 79-81% on 100%Nonrebreather. Pt becomes flushed, teary eyed, emotional, and begins to shake/tremor greatly. Asked pt if he was frightened and response was "yes". This nurse addressed pt appearance and asked pt if he was ready to be comfort care and will provide morphine, pt states "no". Pt agrees to place bipap on. Pt immediately stops trebling. Morphine given. Pt states "I hate the mask". Discussed at length pt options on comfort care and DNR status. Pt verbalizes and denies the need for this nurse to call MD for further discussion.   Reported to oncoming nurse of event.

## 2012-07-14 NOTE — Progress Notes (Signed)
Pt suprapubic site constantly leaks. Marchelle Folks, [place] for urology, came to see pt this am to replace catheter in order to improve leakage, however we were unable to find a 28Fr catheter anywhere in the hospital. Tried calling [facility], OR, tried floors that specialize in coude catheters, and even spoke with the facility in which the pt resides. All places were unsuccessful.

## 2012-07-14 NOTE — Progress Notes (Signed)
Patient taken off of bipap by RN on 100% NRB. Tolerating well at this time

## 2012-07-14 NOTE — Consult Note (Signed)
Palliative Medicine Team at [facility]  Date: 07/14/2012   Patient Name: [name]  DOB: Oct 21, 1950  MRN: [id number]  Age / Sex: 61 y.o., male   PCP: No primary provider on file. Referring Physician: Calvert Cantor, MD  HPI/Reason for Consultation: 61 yo gentleman who has been a paraplegic since 1974 who lived independently until 2006 when his health deteriorated to the point where he needed skilled nursing care related to chronic non-healing decubitus ulcers and generalized debility. Up until that point he had an excellent QOL he could transfer independently, loved being outside in his garden and was very involved with the care of his mother who had brain cancer until her death in 53. His sister described him as being exceptionally strong physically and stubborn, but also sensitive and fearful about issues related to his poor health, debility and mortality. He was admitted to the hospital in septic shock from a severe PNA and has been persistently hypoxic, a decision was made to make him a DNR by his sister and CCM when he was unresponsive.PMT asked to clarify goals of care given his life threatening illness and  Potential for rapid decline.  Participants in Discussion: Patient, His sister Maureen Ralphs, his niece Lorayne Marek.  Goals/Summary of Discussion: [place] was alert and oriented during our discussion and gave me permission to discuss difficult topics with himself and family regarding his current condition, prognosis, goals and options for comfort care. Clearly this is a sensitive and scary topic for him but despite his dyspnea he was able to communicate most of his thoughts or at least defer or was able to say I don't know when he could not respond. He wants to "get better". Better for him means able to go outside, at least go back to SNF, but ideally home-which is probably unrealistic.   --I offered essentially 2 disease trajectories: Keep trying to get better with antibiotics and any possible  medical modality other than ACLS or Intubation OR go towards full comfort care that may include residential hospice where he could be taking outside on a patio, kept clean, dry, his wishes for quality honored at EOL-we weighted length of time vs. Quality-which was difficult for him. He is considering options.  1. Code Status:  DNR, in line with his stated goals.  2. Scope of Treatment:  Full scope medical treatment with hope for improvement in his condition - for now his goals are to "get better" and return to his SNF.  3. Assessment/Plan:  Primary Diagnoses  1. Stage 4 decubitus 2. Septic Shock 3. Empyema/Effusion 4. Hypoxia 5. Respiratory failure PNA 6. FTT, low albumin 7. Diverting colostomy 8. Paraplegia  Prognosis  PPS 30    Active Symptoms 1. Pain 2. Anxiety 3. Dyspnea 4. Dysphagia  5. Psychosocial Spiritual Asssessment/Interventions:  Patient and Family Adjustment to Illness/Prognosis: Sister wants Hospice care, but feels it is his decision ultimately  there is no formal HCPOA, will need to work on obtaining that for him.  Spiritual Concerns or Needs: Heavy burden, fear, life meaning a purpose.  6. Disposition: TBD pending decompensation or improvement  In his condition.  Social History:   reports that he has never smoked. He does not have any smokeless tobacco history on file. He reports that he does not drink alcohol or use illicit drugs. Living Situation: SNF Occupation: Disabled, lifelong  Family History: Family History  Problem Relation Age of Onset  . Heart failure Mother   . Heart failure Father  Active Medications:  Outpatient medications: Prescriptions prior to admission  Medication Sig Dispense Refill  . acetaminophen (TYLENOL) 325 MG tablet Take 325-650 mg by mouth every 6 (six) hours as needed. Take 325mg  once daily.  Take 650mg  every 6 hours as needed for pain.      Marland Kitchen amLODipine (NORVASC) 10 MG tablet Take 10 mg by mouth daily.      Marland Kitchen  azithromycin (ZITHROMAX) 250 MG tablet Take 250-500 mg by mouth daily. Take 500mg  for 1 day, then take 250mg  every day for 4 days.  First dose 07/10/2012.      . baclofen (LIORESAL) 10 MG tablet Take 10 mg by mouth 4 (four) times daily.      . divalproex (DEPAKOTE) 250 MG DR tablet Take 250 mg by mouth daily. Take along with one Depakote 500mg  DR tablet to equal a dose of 750mg .      . divalproex (DEPAKOTE) 500 MG DR tablet Take 500-1,000 mg by mouth 2 (two) times daily. Take 500mg  along with 250mg  (to equal 750mg  dose) every morning and take 1,000mg  every night at bedtime.      . enoxaparin (LOVENOX) 40 MG/0.4ML injection Inject 40 mg into the skin daily.      Marland Kitchen FeFum-FePoly-FA-B Cmp-C-Biot (INTEGRA PLUS PO) Take 1 capsule by mouth every morning.      Marland Kitchen FLUoxetine (PROZAC) 20 MG capsule Take 60 mg by mouth 2 (two) times daily.      . fluticasone (FLONASE) 50 MCG/ACT nasal spray Place 2 sprays into the nose at bedtime. Use for 1 month.  Final dose due 08/12/2012.      Marland Kitchen guaiFENesin (MUCINEX) 600 MG 12 hr tablet Take 600 mg by mouth 2 (two) times daily. Take for 10 days.  Last dose due on the morning of 07/20/2012.      . hydrochlorothiazide (HYDRODIURIL) 25 MG tablet Take 25 mg by mouth daily.        Marland Kitchen HYDROcodone-acetaminophen (NORCO) 10-325 MG per tablet Take 1 tablet by mouth every 4 (four) hours as needed. For pain       . iron polysaccharides (NIFEREX) 150 MG capsule Take 150 mg by mouth daily.      . lansoprazole (PREVACID) 30 MG capsule Take 30 mg by mouth 2 (two) times daily.       Marland Kitchen latanoprost (XALATAN) 0.005 % ophthalmic solution Place 1 drop into both eyes at bedtime.      . levETIRAcetam (KEPPRA) 1000 MG tablet Take 1,000 mg by mouth 2 (two) times daily.        Marland Kitchen lisinopril (PRINIVIL,ZESTRIL) 10 MG tablet Take 10 mg by mouth daily.      Marland Kitchen oxyCODONE (OXY IR/ROXICODONE) 5 MG immediate release tablet Take 5 mg by mouth every 6 (six) hours as needed. For pain      . saccharomyces boulardii  (FLORASTOR) 250 MG capsule Take 250 mg by mouth 2 (two) times daily. Take for 7 days.  Final dose due on the morning of 07/17/2012.      . tolterodine (DETROL) 2 MG tablet Take 2 mg by mouth at bedtime.      Marland Kitchen zolpidem (AMBIEN) 5 MG tablet Take 5 mg by mouth at bedtime as needed. For sleep        Current medications: Infusions:    . [DISCONTINUED] sodium chloride 125 mL/hr (07/14/12 0019)    Scheduled Medications:    . antiseptic oral rinse  15 mL Mouth Rinse q12n4p  . chlorhexidine  15 mL Mouth  Rinse BID  . collagenase   Topical Daily  . divalproex  750 mg Oral Daily  . enoxaparin (LOVENOX) injection  40 mg Subcutaneous Q24H  . [COMPLETED] furosemide  80 mg Intravenous Once  . latanoprost  1 drop Both Eyes QHS  . levETIRAcetam  1,000 mg Oral BID  . oxybutynin  5 mg Oral TID  . piperacillin-tazobactam (ZOSYN)  IV  3.375 g Intravenous Q8H  . saccharomyces boulardii  250 mg Oral BID  . vancomycin  1,250 mg Intravenous Q24H  . [DISCONTINUED] baclofen  10 mg Oral QID  . [DISCONTINUED] feeding supplement  237 mL Oral BID BM  . [DISCONTINUED] feeding supplement  30 mL Oral BID  . [DISCONTINUED] FLUoxetine  60 mg Oral BID  . [DISCONTINUED] guaiFENesin  600 mg Oral BID  . [DISCONTINUED] iron polysaccharides  150 mg Oral Daily  . [DISCONTINUED] multivitamin with minerals  1 tablet Oral Daily  . [DISCONTINUED] nutrition supplement  1 packet Oral BID BM  . [DISCONTINUED] oxybutynin  5 mg Oral BID  . [DISCONTINUED] oxybutynin  5 mg Oral QHS  . [DISCONTINUED] vancomycin  1,000 mg Intravenous Q12H    PRN Medications: morphine injection   Vital Signs: BP 118/43  Pulse 87  Temp 98.2 F (36.8 C) (Axillary)  Resp 26  Ht 6' 0.84" (1.85 m)  Wt 102 kg (224 lb 13.9 oz)  BMI 29.80 kg/m2  SpO2 92%   Physical Exam:  General appearance: NAD, conversant  Eyes: anicteric sclerae, moist conjunctivae; no lid-lag; PERRLA HENT: Atraumatic; oropharynx clear with moist mucous membranes and no  mucosal ulcerations; normal hard and soft palate Neck: Trachea midline; FROM, supple, no thyromegaly or lymphadenopathy Lungs: CTA, with normal respiratory effort and no intercostal retractions CV: RRR, no MRGs  Abdomen: Soft, non-tender; no masses or HSM Extremities: No peripheral edema or extremity lymphadenopathy Skin: Normal temperature, turgor and texture; no rash, ulcers or subcutaneous nodules Psych: Appropriate affect, alert and oriented to person, place and time  Labs:  Basic or Comprehensive Metabolic Panel:    Component Value Date/Time   NA 143 07/14/2012 0600   K 5.1 07/14/2012 0600   CL 106 07/14/2012 0600   CO2 27 07/14/2012 0600   BUN 31* 07/14/2012 0600   CREATININE 0.76 07/14/2012 0600   GLUCOSE 111* 07/14/2012 0600   CALCIUM 8.7 07/14/2012 0600   AST 21 07/13/2012 0500   ALT 25 07/13/2012 0500   ALKPHOS 137* 07/13/2012 0500   BILITOT 0.2* 07/13/2012 0500   PROT 7.1 07/13/2012 0500   ALBUMIN 1.8* 07/13/2012 0500     CBC:    Component Value Date/Time   WBC 14.3* 07/14/2012 0600   HGB 8.7* 07/14/2012 0600   HCT 30.2* 07/14/2012 0600   PLT 606* 07/14/2012 0600   MCV 85.8 07/14/2012 0600   NEUTROABS 12.7* 07/12/2012 1209   LYMPHSABS 1.4 07/12/2012 1209   MONOABS 1.3* 07/12/2012 1209   EOSABS 0.0 07/12/2012 1209   BASOSABS 0.0 07/12/2012 1209     BNP (last 3 results)  Basename 07/12/12 1320  PROBNP 3087.0*    CBG (last 3)  No results found for this basename: GLUCAP:3 in the last 72 hours  Imaging:  Dg Chest Port 1 View  07/13/2012  *RADIOLOGY REPORT*  Clinical Data: Acute respiratory distress.  Left pleural effusion. Sepsis.  PORTABLE CHEST - 1 VIEW  Comparison: 07/12/2012  Findings: Large layering left pleural effusion is again seen as well as left lung atelectasis.  Low lung volumes again seen however right  lung remains grossly clear.  Heart size remains stable. Mediastinal widening and tracheal deviation to the right is unchanged.  IMPRESSION:  1.  Large layering left  pleural effusion on left lung atelectasis, without significant change. 2.  Stable mediastinal widening with tracheal deviation to the right.   Original Report Authenticated By: Myles Rosenthal, M.D.     Other Data:  (2D echo, EKG...)   Educational Materials Given:  DNR: yes MOST: No Healthcare Power-of-Attorney: No   Time: 70 minutes  Greater than 50%  of this time was spent counseling and coordinating care related to the above assessment and plan.  Signed by: Edsel Petrin, DO  07/14/2012, 5:06 PM  Please contact Palliative Medicine Team phone at [phone] for questions and concerns.

## 2012-07-14 NOTE — Progress Notes (Signed)
Superficial eschar.   Will do hydrotherapy and Santyl to facilitate bedside debridement.

## 2012-07-14 NOTE — Clinical Social Work Psychosocial (Signed)
     Clinical Social Work Department BRIEF PSYCHOSOCIAL ASSESSMENT 07/14/2012  Patient:  [name], [name]     Account Number:  [id number]     Admit date:  07/12/2012  Clinical Social Worker:  Margaree Mackintosh  Date/Time:  07/14/2012 01:22 PM  Referred by:  Care Management  Date Referred:  07/14/2012 Referred for  SNF Placement   Other Referral:   Interview type:  Patient Other interview type:   And sister.    PSYCHOSOCIAL DATA Living Status:  FACILITY Admitted from facility:  [facility] Level of care:  Skilled Nursing Facility Primary support name:  [name]: [phone] Primary support relationship to patient:  SIBLING Degree of support available:   Adequate.    CURRENT CONCERNS Current Concerns  Post-Acute Placement   Other Concerns:    SOCIAL WORK ASSESSMENT / PLAN Clinical Social Worker reviewed chart and noted pt presents with several wounds and on bi pap.  PTA pt resided at [facility].  CSW met with pt at bedside-pt currenlty on bipap.  CSW introduced self, explained role, and provided support.  CSW provided active listening, pt demonstrated some difficulty to fully participate in assessment.  Pt able to confirm he was from [place], but not very participatory in conversation.  CSW inquired if there were any family invovled with whom this CSW could discuss dc planning.  Pt said "[name]".  CSW reviewed chart and confirmed with pt that [name] is pt's sister.    CSW phoned pt's sister.  CSW introduced self, explained role, and provided support.  CSW provided active listening; sister shared that pt has been "in and out of [address] [facility]) since 2006".  CSW reviewed potential dc options; review LTAC, return to [place], locate a new SNF.  Sister stated she would like to speak with pt in more detail this evening and expressed interest in [facility].    CSW updated [facility] and RNCM to pt's potential interest.  CSW to follow  peripherally to assist as needed.   Assessment/plan status:  Information/Referral to [facility] Other assessment/ plan:   Information/referral to community resources:   LTAC  SNF    PATIENTS/FAMILYS RESPONSE TO PLAN OF CARE: Pt was not very participatory in assessment but did consent to CSW speaking with pt's sister regarding dc planning. Sister was engaged and thanked CSW for intervention.

## 2012-07-14 NOTE — Progress Notes (Signed)
[facility] PROGRESS NOTE  GATES JIVIDEN WUX:[id number] DOB: [birth year] DOA: 07/12/2012 PCP: No primary provider on file.  Assessment/Plan:  Principal Problem:  *sepsis associated hypotension Due to infected sacral decub and pneumonia. Hydration has significantly improved his BP however he now appears to be fluid overloaded.   Active Problems:  Acute respiratory failure Condition worsening - on and off Bipap since admission- ABG reveals resp acidosis with pH of 7.2-  I suspect part of this is due to fluid overload and severe hypoalbuminemia resulting in a  large pleural effusion- unfortunately due to his resp distress we are unable to obtain a CT to r/o a para-pneumonic effusion or empyema.  Procalcitonin is 0.21 which may mean pneumonia is viral- cont Vanc Zosyn and Levaquin for HCAP for now Have ordered ECHO evaluate for CHF and it reveals an Ef of 55-60% an a mass adherent to the heart   Altered mental status/ toxic encephalopathy Resolved- now alert and oriented   Infected Decubitus ulcer of coccygeal region Has had chronic decubitus ulcers with infections- (per prior notes and numerous CTs) - ID consulted - wound care consulted - surgery consulted for further debridement-  Hydrotherapy and Santyl dressing ordered by consultants Have requested records from [facility]  - in light of current respiratory distress,  we are unable to more than antibiotics for this wound at this point.    H/O CHF Appears to be diastolic dysfunction per echo in 2006 - have repeated echo as mentioned above but no mention of diastolic dysfunction on this ECHO.   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  Patient was alert and oriented this am and complaining of cough . Around 11:30 am he was poorly responsive and lethargic and shaky. BP was 90/70  Patient at baseline is Well alert and oriented and able to communicate well. He is immobile however is able to turn in bed. As per the nurse he was started on Zyvox and Augmentin of 2 weeks back for possible infection over the coccyx and is still on it. He however did not have any fever. Patient was then transferred to [facility]. In the ED he was placed on BiPAP with a ABG showing hypercapnic respiratory failure. His blood pressure was 72/32 mmhg. her chest x-ray done showed hazy opacification of left hemithorax. [facility] and was called for admission to ICU. However given his multiple comorbidities, PC CM and discussed with patient's sister Maureen Ralphs who recommended to make him DO NOT RESUSCITATE and possible comfort care. Patient was given a dose of IV vancomycin and Zosyn in the ED along with IV fluids and recommended to be  admited to step down unit under [facility] care.  Patient did not complain of any chest pain, shortness of breath, palpitations, abdominal pain, headache, nausea or vomiting. He did not have any diarrhea fever or chills.   Consultants:  surgery  Procedures:  none  Antibiotics:  Vanc- Zosyn-  Levaquin - started 12/4  HPI/Subjective: Pt alert- I have explained that his respiratory status is poor and he is likely to decline and die soon. He states that he is not ready to die yet. I have decided to consult palliative care to further explain his poor prognosis.   Objective: Filed Vitals:   07/14/12 1100 07/14/12 1104 07/14/12 1200 07/14/12 1203  BP: 80/62 80/62 137/58 137/58  Pulse: 84 86 86 87  Temp:    98.2 F (36.8 C)  TempSrc:    Axillary  Resp: 22 18 27 29   Height:      Weight:      SpO2: 96% 97% 92% 90%    Intake/Output Summary (Last 24 hours) at 07/14/12 1342 Last data filed at 07/14/12 1200  Gross per 24 hour  Intake   1375 ml  Output      0 ml  Net   1375 ml    Exam:   General:  Alert, moderate distress on Bipap  Cardiovascular: RRR, no murmurs  Respiratory: very decreased breath sounds- pulse ox in 80s when transitioned to NRB.   Abdomen: soft, NT, ND, colostomy intact- suprapubic cath leaking from around the site- no urine going into collecting bag.   Ext: deformities in feet- no edema  Data Reviewed: Basic Metabolic Panel:  Lab 07/14/12 1610 07/13/12 0500 07/12/12 1335 07/12/12 1209  NA 143 137 139 134*  K 5.1 5.4* 5.7* NOT DONE  CL 106 102 100 94*  CO2 27 24 29 26   GLUCOSE 111* 95 112* 123*  BUN 31* 34* 37* 37*  CREATININE 0.76 1.15 1.47* 1.35  CALCIUM 8.7 8.1* 8.3* 8.9  MG -- -- -- --  PHOS -- -- -- --   Liver Function Tests:  Lab 07/13/12 0500 07/12/12 1335 07/12/12 1209  AST 21 17 80*  ALT 25 29 NOT DONE  ALKPHOS 137* 152* NOT DONE  BILITOT 0.2* 0.2* 0.3  PROT 7.1 7.3 8.2  ALBUMIN 1.8* 2.1* 2.2*   No results found for this basename: LIPASE:5,AMYLASE:5 in the last 168 hours No results found for this basename: AMMONIA:5 in the last 168 hours CBC:  Lab 07/14/12 0600 07/13/12 0500 07/12/12 1209  WBC 14.3* 16.5* 15.4*  NEUTROABS -- -- 12.7*  HGB 8.7* 8.9* 10.0*  HCT 30.2* 29.9* 33.1*  MCV 85.8 82.8 80.5  PLT 606* 504* 541*    Cardiac Enzymes: No results found for this basename: CKTOTAL:5,CKMB:5,CKMBINDEX:5,TROPONINI:5 in the last 168 hours BNP (last 3 results)  Basename 07/12/12 1320  PROBNP 3087.0*   CBG: No results found for this basename: GLUCAP:5 in the last 168 hours  Recent Results (from the past 240 hour(s))  CULTURE, BLOOD (ROUTINE X 2)     Status: Normal (Preliminary result)   Collection Time   07/12/12 12:20 PM      Component Value Range Status Comment   Specimen Description BLOOD ARM RIGHT   Final    Special Requests BOTTLES DRAWN AEROBIC ONLY   Final    Culture  Setup Time 07/12/2012 21:55   Final    Culture     Final    Value:        BLOOD CULTURE RECEIVED  NO GROWTH TO DATE CULTURE WILL BE HELD FOR 5 DAYS BEFORE ISSUING A FINAL NEGATIVE REPORT   Report Status PENDING   Incomplete   CULTURE, BLOOD (ROUTINE X 2)     Status: Normal (Preliminary result)   Collection Time   07/12/12 12:44 PM      Component Value Range Status Comment   Specimen Description BLOOD HAND RIGHT   Final    Special Requests BOTTLES DRAWN AEROBIC ONLY 2CC   Final    Culture  Setup Time 07/12/2012 16:52   Final    Culture     Final    Value:        BLOOD CULTURE RECEIVED NO GROWTH TO DATE CULTURE WILL BE HELD FOR 5 DAYS BEFORE ISSUING A FINAL NEGATIVE REPORT   Report Status PENDING   Incomplete   URINE CULTURE     Status: Normal   Collection Time   07/12/12  2:10 PM      Component Value Range Status Comment   Specimen Description URINE, CLEAN CATCH   Final    Special Requests NONE   Final    Culture  Setup Time 07/12/2012 15:02   Final    Colony Count 40,000 COLONIES/ML   Final    Culture     Final    Value: Multiple bacterial morphotypes present, none predominant. Suggest appropriate recollection if clinically indicated.   Report Status 07/13/2012 FINAL   Final   MRSA PCR SCREENING     Status: Normal   Collection Time   07/12/12  5:22 PM      Component Value Range Status Comment   MRSA by PCR NEGATIVE   NEGATIVE Final      Studies: Ct Head Wo Contrast  07/12/2012  *RADIOLOGY REPORT*  Clinical Data:  Altered mental status  CT HEAD WITHOUT CONTRAST  Technique:  Contiguous axial images were obtained from the base of the skull through the vertex without contrast. Study was obtained within 24 hours of patient arrival at the emergency department.  Comparison: None.  Findings: There is mild diffuse atrophy.  There is no mass, hemorrhage, extra-axial fluid collection, or midline shift.  No focal gray-white compartment lesions are identified.  No acute infarct is appreciable on this study.  Bony calvarium appears intact.  The mastoid air cells are clear.  IMPRESSION: Mild atrophy.  Study otherwise unremarkable.   Original Report Authenticated By: Bretta Bang, M.D.    Dg Chest Port 1 View  07/12/2012  *RADIOLOGY REPORT*  Clinical Data: Unresponsive, pain  PORTABLE CHEST - 1 VIEW  Comparison: 05/06/2010  Findings: Cardiac size is stable.  There is widening  mid mediastinum and left hilar prominence.  Hazy opacification of the left hemithorax may be due to effusion with associated atelectasis or diffuse infiltrates/pneumonia.  Asymmetric pulmonary edema cannot be excluded.  Central mild vascular congestion.  Mild right perihilar interstitial prominence.  IMPRESSION:  There is widening  mid mediastinum and left hilar prominence. Hazy opacification of the left hemithorax may be due to effusion with associated atelectasis or diffuse infiltrates/pneumonia. Asymmetric pulmonary edema cannot be excluded.  Central mild vascular congestion.  Mild right perihilar interstitial prominence.   Original Report Authenticated By: Natasha Mead, M.D.     Scheduled Meds:    . antiseptic oral rinse  15 mL Mouth Rinse q12n4p  . baclofen  10 mg Oral QID  . chlorhexidine  15 mL Mouth Rinse BID  . collagenase   Topical Daily  . divalproex  750 mg  Oral Daily  . enoxaparin (LOVENOX) injection  40 mg Subcutaneous Q24H  . feeding  supplement  237 mL Oral BID BM  . feeding supplement  30 mL Oral BID  . FLUoxetine  60 mg Oral BID  . [COMPLETED] furosemide  80 mg Intravenous Once  . guaiFENesin  600 mg Oral BID  . iron polysaccharides  150 mg Oral Daily  . latanoprost  1 drop Both Eyes QHS  . levETIRAcetam  1,000 mg Oral BID  . multivitamin with minerals  1 tablet Oral Daily  . nutrition supplement  1 packet Oral BID BM  . oxybutynin  5 mg Oral TID  . piperacillin-tazobactam (ZOSYN)  IV  3.375 g Intravenous Q8H  . saccharomyces boulardii  250 mg Oral BID  . vancomycin  1,000 mg Intravenous Q12H  . [DISCONTINUED] levofloxacin (LEVAQUIN) IV  750 mg Intravenous Q24H  . [DISCONTINUED] oxybutynin  5 mg Oral BID  . [DISCONTINUED] oxybutynin  5 mg Oral QHS   Continuous Infusions:    . [DISCONTINUED] sodium chloride 125 mL/hr (07/14/12 0019)    ________________________________________________________________________  Time spent: 50 min    [facility]  [facility] Pager [phone] If 8PM-8AM, please contact night-coverage at [email or web address], password [facility] 07/14/2012, 1:42 PM  LOS: 2 days

## 2012-07-14 NOTE — Progress Notes (Signed)
ANTIBIOTIC CONSULT NOTE - INITIAL  Pharmacy Consult for Vancomycin, Zosyn Indication: pneumonia  Allergies  Allergen Reactions  . Ciprofloxacin In D5w Itching  . Minocycline Hcl Itching    Patient Measurements: Weight = 111 kg   Labs:  Basename 07/14/12 0600 07/13/12 0500 07/12/12 1335 07/12/12 1209  WBC 14.3* 16.5* -- 15.4*  HGB 8.7* 8.9* -- 10.0*  PLT 606* 504* -- 541*  LABCREA -- -- -- --  CREATININE 0.76 1.15 1.47* --   Estimated Creatinine Clearance: 121.4 ml/min (by C-G formula based on Cr of 0.76).  Basename 07/14/12 1140  VANCOTROUGH 24.2*  VANCOPEAK --  VANCORANDOM --  GENTTROUGH --  GENTPEAK --  GENTRANDOM --  TOBRATROUGH --  TOBRAPEAK --  TOBRARND --  AMIKACINPEAK --  AMIKACINTROU --  AMIKACIN --     Microbiology: Recent Results (from the past 720 hour(s))  CULTURE, BLOOD (ROUTINE X 2)     Status: Normal (Preliminary result)   Collection Time   07/12/12 12:20 PM      Component Value Range Status Comment   Specimen Description BLOOD ARM RIGHT   Final    Special Requests BOTTLES DRAWN AEROBIC ONLY   Final    Culture  Setup Time 07/12/2012 21:55   Final    Culture     Final    Value:        BLOOD CULTURE RECEIVED NO GROWTH TO DATE CULTURE WILL BE HELD FOR 5 DAYS BEFORE ISSUING A FINAL NEGATIVE REPORT   Report Status PENDING   Incomplete   CULTURE, BLOOD (ROUTINE X 2)     Status: Normal (Preliminary result)   Collection Time   07/12/12 12:44 PM      Component Value Range Status Comment   Specimen Description BLOOD HAND RIGHT   Final    Special Requests BOTTLES DRAWN AEROBIC ONLY 2CC   Final    Culture  Setup Time 07/12/2012 16:52   Final    Culture     Final    Value:        BLOOD CULTURE RECEIVED NO GROWTH TO DATE CULTURE WILL BE HELD FOR 5 DAYS BEFORE ISSUING A FINAL NEGATIVE REPORT   Report Status PENDING   Incomplete   URINE CULTURE     Status: Normal   Collection Time   07/12/12  2:10 PM      Component Value Range Status Comment   Specimen Description URINE, CLEAN CATCH   Final    Special Requests NONE   Final    Culture  Setup Time 07/12/2012 15:02   Final    Colony Count 40,000 COLONIES/ML   Final    Culture     Final    Value: Multiple bacterial morphotypes present, none predominant. Suggest appropriate recollection if clinically indicated.   Report Status 07/13/2012 FINAL   Final   MRSA PCR SCREENING     Status: Normal   Collection Time   07/12/12  5:22 PM      Component Value Range Status Comment   MRSA by PCR NEGATIVE  NEGATIVE Final     Medical History: Past Medical History  Diagnosis Date  . Coronary artery disease   . CHF (congestive heart failure)   . Paraplegia   . GERD (gastroesophageal reflux disease)   . Seizure    Assessment: 61 year old with a history of paraplegia, CAD, CHF, and seizures beginning empiric antibiotics for pneumonia. He does weigh over 100k and not on that high of a dose for  vanc. Yet his level still came back at 24. Will adjust.   Goal of Therapy:  Vancomycin trough level 15-20 mcg/ml Appropriate Zosyn dosing  Plan:  1) Zosyn 3.375 grams iv Q 8 hours (4 hr infusion) 2) Change vanc to 1.25g IV q24

## 2012-07-14 NOTE — Progress Notes (Signed)
PT HYDROTHERAPY EVALUATION NOTE   Pt is a 61 y.o. male who presents with large sacral pressure wounds covered in leathery eschar, wounds appear superficial but may evlove as debridement is performed. Pt also presents with tunneling wound with depth to the bone of the sacrum. Patient will benefit from hydrotherapy including PLS and selective debridement to facilitate wound healing and decrease necrotic tissue. Surgical team present upon initial inspection, in agreement with trial of santyl ointment, hydrotherapy, and selective debridement.  Will continue to perform as indicated. Rec 6x wk.     07/14/12 0800  Subjective Assessment  Subjective Pt on continuous Bi Pap; minimally communicative but aggreeable to hydrotherapy  Patient and Family Stated Goals heal wounds  Date of Onset 07/14/12 (Wound Onset Prior to Admission)  Evaluation and Treatment  Evaluation and Treatment Procedures Explained to Patient/Family Yes  Evaluation and Treatment Procedures agreed to  Pressure Ulcer 07/14/12 Stage III -  Full thickness tissue loss. Subcutaneous fat may be visible but bone, tendon or muscle are NOT exposed. Sacral wound (bilaterall buttock region wound 1)  Date First Assessed/Time First Assessed: 07/14/12 1000   Location: Sacrum  Staging: Stage III -  Full thickness tissue loss. Subcutaneous fat may be visible but bone, tendon or muscle are NOT exposed.  Wound Description (Comments): Sacral wound (bilatera  State of Healing Eschar  Site / Wound Assessment Bleeding;Black;Dusky;Pale;Pink;Red;Yellow  % Wound base Red or Granulating 20%  % Wound base Yellow 30%  % Wound base Black 50%  Peri-wound Assessment Purple;Pink;Erythema (blanchable)  Wound Length (cm) 14 cm  Wound Width (cm) 10 cm  Wound Depth (cm) 0.2 cm  Tunneling (cm) none evident at this time  Undermining (cm) none evident at this time  Drainage Amount Minimal  Drainage Description Serosanguineous;Serous;No odor  Treatment Debridement  (Selective);Hydrotherapy (Pulse lavage);Packing (Saline gauze);Other (Comment);Tape changed (santyl)  Dressing Type ABD;Barrier Film (skin prep);Moist to dry;Tape dressing (Dry guaze over saline guaze; ABDs; skin prep; santyl)  Dressing Changed;Intact;Dry;Clean  Pressure Ulcer 07/14/12 Stage III -  Full thickness tissue loss. Subcutaneous fat may be visible but bone, tendon or muscle are NOT exposed. Sacral wound (proximal wound 2)  Date First Assessed/Time First Assessed: 07/14/12 1000   Location: Sacrum  Staging: Stage III -  Full thickness tissue loss. Subcutaneous fat may be visible but bone, tendon or muscle are NOT exposed.  Wound Description (Comments): Sacral wound (proximal  State of Healing Eschar  Site / Wound Assessment Bleeding;Black;Dusky;Pale;Pink;Red;Yellow  % Wound base Red or Granulating 20%  % Wound base Yellow 40%  % Wound base Black 50%  Peri-wound Assessment Purple;Pink;Erythema (blanchable)  Wound Length (cm) 8 cm  Wound Width (cm) 10 cm  Wound Depth (cm) 0.1 cm  Tunneling (cm) none evident at this time  Undermining (cm) none evident at this time  Drainage Amount Minimal  Drainage Description Serous;Serosanguineous  Treatment Debridement (Selective);Hydrotherapy (Pulse lavage);Packing (Saline gauze);Other (Comment);Tape changed  Dressing Type ABD;Barrier Film (skin prep);Moist to dry;Tape dressing  Dressing Changed;Clean;Intact  Pressure Ulcer 07/14/12 Stage III -  Full thickness tissue loss. Subcutaneous fat may be visible but bone, tendon or muscle are NOT exposed. Tunneling to sacrum (near midline of left buttock cheek)  Date First Assessed/Time First Assessed: 07/14/12 1000   Location: Buttocks  Staging: Stage III -  Full thickness tissue loss. Subcutaneous fat may be visible but bone, tendon or muscle are NOT exposed.  Wound Description (Comments): Tunneling to sacrum   State of Healing Other (Comment) (full tunneling wound)  Site /  Wound Assessment Clean;Pink   % Wound base Other (Comment) 100%  Peri-wound Assessment Erythema (blanchable)  Wound Length (cm) 1 cm  Wound Width (cm) 0.8 cm  Wound Depth (cm) (see tunneling)  Tunneling (cm) 7  Margins Epibole (rolled edges)  Drainage Amount Minimal  Drainage Description Serous  Treatment Hydrotherapy (Pulse lavage) (with pulled back suction tip)  Dressing Type Gauze (Comment)  Dressing Clean (packed with saline guaze using q-tip  for wound wicking)  Hydrotherapy  Pulsed Lavage with Suction (psi) 4 psi  Pulsed Lavage with Suction - Normal Saline Used 1000 mL  Pulsed Lavage Tip Tip with splash shield (pulled suction tip back for tunneling)  Pulsed lavage therapy - wound location 2 sacral wounds and buttock tunnel  Selective Debridement  Selective Debridement - Location sacral wounds 1 and 2  Selective Debridement - Tools Used Forceps;Scalpel;Scissors  Selective Debridement - Tissue Removed large portions of eschar; some portions of yellow slough  Wound Therapy - Assess/Plan/Recommendations  Wound Therapy - Clinical Statement Pt is a 61 y.o. male who presents with large sacral pressure wounds covered in leathery eschar, wounds appear superficial but may evlove as debridement is performed. Pt also presents with tunneling wound with depth to the bone of the sacrum. Patient will benefit from hydrotherapy including PLS and selective debridement to facilitate wound healing and decrease necrotic tissue. Surgical team present upon initial inspection, in agreement with trial of santyl ointment, hydrotherapy, and selective debridement.  Will continue to perform as indicated. Rec 6x wk.   Wound Therapy - Functional Problem List decreased mobility; increased risk of infection secondary to poor skin integrity.  Factors Delaying/Impairing Wound Healing Infection - systemic/local;Immobility;Multiple medical problems;Vascular compromise;Incontinence (pt has leaking suprapubic catheter)  Hydrotherapy Plan  Debridement;Dressing change;Patient/family education;Pulsatile lavage with suction;Other (comment) (use of santyl ointment)  Wound Therapy - Frequency 6X / week  Wound Therapy - Follow Up Recommendations Skilled nursing facility  Wound Plan Continue as indicated above  Wound Therapy Goals - Improve the function of patient's integumentary system by progressing the wound(s) through the phases of wound healing by:  Decrease Necrotic Tissue to 20%  Decrease Necrotic Tissue - Progress Goal set today  Increase Granulation Tissue to 50%  Increase Granulation Tissue - Progress Goal set today  Goals/treatment plan/discharge plan were made with and agreed upon by patient/family Yes  Time For Goal Achievement 7 days  Wound Therapy - Potential for Goals Fair     Patient pre-medicated for pain; vitals fluctuating throughout treatment with low BPs but no critical values. Pt on BiPap throughout duration of treatment.  Charlotte Crumb, PT DPT  [phone]

## 2012-07-14 NOTE — Progress Notes (Signed)
PMT consult via phone from Dr. Butler Denmark for GOC Scheduled for Friday 12/6 @ 4pm with sister Maureen Ralphs [phone] and sister Bonita Quin [phone].  Chalmers Cater RN PMT phone [phone]

## 2012-07-14 NOTE — Progress Notes (Signed)
[facility]  Days of antibiotics # 3 Days of Vancomycin # 3 Days of zosyn # 3  Two days of levaquin  Subjective: Pt had rough night, back on bipap andhaving wound examined   Antibiotics:  Anti-infectives     Start     Dose/Rate Route Frequency Ordered Stop   07/13/12 0000   vancomycin (VANCOCIN) IVPB 1000 mg/200 mL premix        1,000 mg 200 mL/hr over 60 Minutes Intravenous Every 12 hours 07/12/12 1429     07/12/12 2200   piperacillin-tazobactam (ZOSYN) IVPB 3.375 g  Status:  Discontinued        3.375 g 12.5 mL/hr over 240 Minutes Intravenous 3 times per day 07/12/12 1429 07/12/12 1939   07/12/12 2000  piperacillin-tazobactam (ZOSYN) IVPB 3.375 g       3.375 g 12.5 mL/hr over 240 Minutes Intravenous Every 8 hours 07/12/12 1939     07/12/12 1745   levofloxacin (LEVAQUIN) IVPB 750 mg  Status:  Discontinued        750 mg 100 mL/hr over 90 Minutes Intravenous Every 24 hours 07/12/12 1711 07/13/12 1654   07/12/12 1715   piperacillin-tazobactam (ZOSYN) IVPB 3.375 g  Status:  Discontinued        3.375 g 12.5 mL/hr over 240 Minutes Intravenous 3 times per day 07/12/12 1711 07/12/12 1717   07/12/12 1430   vancomycin (VANCOCIN) 1,500 mg in sodium chloride 0.9 % 500 mL IVPB        1,500 mg 250 mL/hr over 120 Minutes Intravenous  Once 07/12/12 1429 07/12/12 1652   07/12/12 1400   vancomycin (VANCOCIN) IVPB 1000 mg/200 mL premix  Status:  Discontinued        1,000 mg 200 mL/hr over 60 Minutes Intravenous  Once 07/12/12 1353 07/12/12 1427   07/12/12 1400  piperacillin-tazobactam (ZOSYN) IVPB 3.375 g       3.375 g 12.5 mL/hr over 240 Minutes Intravenous  Once 07/12/12 1353 07/12/12 1453          Medications: Scheduled Meds:   . antiseptic oral rinse  15 mL Mouth Rinse q12n4p  . baclofen  10 mg Oral QID  . chlorhexidine  15 mL Mouth Rinse BID  . collagenase   Topical Daily  . divalproex  750 mg Oral Daily  . enoxaparin (LOVENOX) injection   40 mg Subcutaneous Q24H  . feeding supplement  237 mL Oral BID BM  . feeding supplement  30 mL Oral BID  . FLUoxetine  60 mg Oral BID  . guaiFENesin  600 mg Oral BID  . iron polysaccharides  150 mg Oral Daily  . latanoprost  1 drop Both Eyes QHS  . levETIRAcetam  1,000 mg Oral BID  . multivitamin with minerals  1 tablet Oral Daily  . nutrition supplement  1 packet Oral BID BM  . oxybutynin  5 mg Oral TID  . piperacillin-tazobactam (ZOSYN)  IV  3.375 g Intravenous Q8H  . saccharomyces boulardii  250 mg Oral BID  . vancomycin  1,000 mg Intravenous Q12H  . [DISCONTINUED] FLUoxetine  60 mg Oral BID  . [DISCONTINUED] levofloxacin (LEVAQUIN) IV  750 mg Intravenous Q24H  . [DISCONTINUED] oxybutynin  5 mg Oral BID  . [DISCONTINUED] oxybutynin  5 mg Oral QHS   Continuous Infusions:   . sodium chloride 125 mL/hr (07/14/12 0019)   PRN Meds:.morphine injection   Objective: Weight change:   Intake/Output Summary (Last 24 hours) at 07/14/12 1106  Last data filed at 07/14/12 0800  Gross per 24 hour  Intake   1375 ml  Output      0 ml  Net   1375 ml   Blood pressure 80/62, pulse 86, temperature 98.3 F (36.8 C), temperature source Axillary, resp. rate 18, height 6' 0.84" (1.85 m), weight 224 lb 13.9 oz (102 kg), SpO2 97.00%. Temp:  [98.2 F (36.8 C)-98.6 F (37 C)] 98.3 F (36.8 C) (12/06 0828) Pulse Rate:  [82-102] 86  (12/06 1104) Resp:  [18-33] 18  (12/06 1104) BP: (80-150)/(42-62) 80/62 mmHg (12/06 1104) SpO2:  [82 %-97 %] 97 % (12/06 1104) FiO2 (%):  [80 %-100 %] 100 % (12/06 0828)  Physical Exam: General: Alert and awake, oriented x3, on BiPAP.  HEENT: anicteric sclera, pupils reactive to light and accommodation, EOMI, oropharynx clear and without exudate  CVS regular rate, normal r, no murmur rubs or gallops  Chest: Rhonchi heard.  Abdomen: soft nontender, nondistended, normal bowel sounds, S2 with dark pasty stool. Suprapubic catheter placement in place.  Skin: he has  several areas of necrotic skin posteriorly, he has former graft site that is intact, he has macerated skin near where urine is leaking. His open fistula does tunnel to bone as wound care Rns showed me.  Neuro: Paraplegia   Lab Results:  Basename 07/14/12 0600 07/13/12 0500  WBC 14.3* 16.5*  HGB 8.7* 8.9*  HCT 30.2* 29.9*  PLT 606* 504*    BMET  Basename 07/14/12 0600 07/13/12 0500  NA 143 137  K 5.1 5.4*  CL 106 102  CO2 27 24  GLUCOSE 111* 95  BUN 31* 34*  CREATININE 0.76 1.15  CALCIUM 8.7 8.1*    Micro Results: Recent Results (from the past 240 hour(s))  CULTURE, BLOOD (ROUTINE X 2)     Status: Normal (Preliminary result)   Collection Time   07/12/12 12:20 PM      Component Value Range Status Comment   Specimen Description BLOOD ARM RIGHT   Final    Special Requests BOTTLES DRAWN AEROBIC ONLY   Final    Culture  Setup Time 07/12/2012 21:55   Final    Culture     Final    Value:        BLOOD CULTURE RECEIVED NO GROWTH TO DATE CULTURE WILL BE HELD FOR 5 DAYS BEFORE ISSUING A FINAL NEGATIVE REPORT   Report Status PENDING   Incomplete   CULTURE, BLOOD (ROUTINE X 2)     Status: Normal (Preliminary result)   Collection Time   07/12/12 12:44 PM      Component Value Range Status Comment   Specimen Description BLOOD HAND RIGHT   Final    Special Requests BOTTLES DRAWN AEROBIC ONLY 2CC   Final    Culture  Setup Time 07/12/2012 16:52   Final    Culture     Final    Value:        BLOOD CULTURE RECEIVED NO GROWTH TO DATE CULTURE WILL BE HELD FOR 5 DAYS BEFORE ISSUING A FINAL NEGATIVE REPORT   Report Status PENDING   Incomplete   URINE CULTURE     Status: Normal   Collection Time   07/12/12  2:10 PM      Component Value Range Status Comment   Specimen Description URINE, CLEAN CATCH   Final    Special Requests NONE   Final    Culture  Setup Time 07/12/2012 15:02   Final  Colony Count 40,000 COLONIES/ML   Final    Culture     Final    Value: Multiple bacterial  morphotypes present, none predominant. Suggest appropriate recollection if clinically indicated.   Report Status 07/13/2012 FINAL   Final   MRSA PCR SCREENING     Status: Normal   Collection Time   07/12/12  5:22 PM      Component Value Range Status Comment   MRSA by PCR NEGATIVE  NEGATIVE Final     Studies/Results: Ct Head Wo Contrast  07/12/2012  *RADIOLOGY REPORT*  Clinical Data:  Altered mental status  CT HEAD WITHOUT CONTRAST  Technique:  Contiguous axial images were obtained from the base of the skull through the vertex without contrast. Study was obtained within 24 hours of patient arrival at the emergency department.  Comparison: None.  Findings: There is mild diffuse atrophy.  There is no mass, hemorrhage, extra-axial fluid collection, or midline shift.  No focal gray-white compartment lesions are identified.  No acute infarct is appreciable on this study.  Bony calvarium appears intact.  The mastoid air cells are clear.  IMPRESSION: Mild atrophy.  Study otherwise unremarkable.   Original Report Authenticated By: Bretta Bang, M.D.    Dg Chest Port 1 View  07/13/2012  *RADIOLOGY REPORT*  Clinical Data: Acute respiratory distress.  Left pleural effusion. Sepsis.  PORTABLE CHEST - 1 VIEW  Comparison: 07/12/2012  Findings: Large layering left pleural effusion is again seen as well as left lung atelectasis.  Low lung volumes again seen however right lung remains grossly clear.  Heart size remains stable. Mediastinal widening and tracheal deviation to the right is unchanged.  IMPRESSION:  1.  Large layering left pleural effusion on left lung atelectasis, without significant change. 2.  Stable mediastinal widening with tracheal deviation to the right.   Original Report Authenticated By: Myles Rosenthal, M.D.    Dg Chest Port 1 View  07/12/2012  *RADIOLOGY REPORT*  Clinical Data: Unresponsive, pain  PORTABLE CHEST - 1 VIEW  Comparison: 05/06/2010  Findings: Cardiac size is stable.  There is widening   mid mediastinum and left hilar prominence.  Hazy opacification of the left hemithorax may be due to effusion with associated atelectasis or diffuse infiltrates/pneumonia.  Asymmetric pulmonary edema cannot be excluded.  Central mild vascular congestion.  Mild right perihilar interstitial prominence.  IMPRESSION:  There is widening  mid mediastinum and left hilar prominence. Hazy opacification of the left hemithorax may be due to effusion with associated atelectasis or diffuse infiltrates/pneumonia. Asymmetric pulmonary edema cannot be excluded.  Central mild vascular congestion.  Mild right perihilar interstitial prominence.   Original Report Authenticated By: Natasha Mead, M.D.       Assessment/Plan: [name] is a 61 y.o. male with multiple medical problems paraplegia a large decubitus ulcers status post debridement at [facility] having been on recent Augmentin and Zyvox, admitted with acute apparent sepsis with hypotensive and confusion that has responded well to broad-spectrum antibiotics in the form of vancomycin Zosyn and levofloxacin. Her multiple possible sources including his lungs where he also has large pleural effusion and his deep decubitus ulcer that likely goes to bone.   #1 sepsis: He has responded well to broad-spectrum antibiotics  -- continue vancomycin and zosyn --Follow up blood cultures  - could consider Tap his pleural effusion but would not push this until goals of care more clear   #2 possible pneumonia: has pleural effusion as above. Again will not push yet for aggressive  workup and will defer to primary and CCM re how aggressive we need to be here. Drainage COULD be diagnostic and therapeutic in short term though not without any risk.  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 would consider imaging of his abdomen pelvis with CT or MRI when he is clinically stable but not urgent need today.       LOS: 2 days     [name] 07/14/2012, 11:06 AM

## 2012-07-14 NOTE — Consult Note (Signed)
Urology Consult   Physician requesting consult: Rizwan  Reason for consult: leaking urine around SP tube  History of Present Illness: [name] is a 61 y.o. cauc male with PMH significant for paraplegia, seizures, PNA, and decubitus ulcer who was recently admitted for treatment of sepsis.  He lives in a SNF and his SP is changed once a month.  He has had recurrent issues with leaking around the cath.  Pt states he is uncertain when the cath was last changed but it has been probably around a month.  He also says that is has been leaking for "a while".  He has been taking Oxybutynin 5mg  qhs for quite some time as well.  His medical team is concerned about the leakage contributing to decubitus ulcer.  He is currently on broad spectrum ABx and is being followed by ID.    Pt is on Bipap and is currently a a full no code.  He states he is comfortable except for the Bipap mask. He does have SOB but is not in distress.    Past Medical History  Diagnosis Date  . Coronary artery disease   . CHF (congestive heart failure)   . Paraplegia   . GERD (gastroesophageal reflux disease)   . Seizure    Past surgical history:  SP tube Colostomy Decubitus debridement  Current Hospital Medications:  Home Meds:  Prior to Admission medications   Medication Sig Start Date End Date Taking? Authorizing Provider  acetaminophen (TYLENOL) 325 MG tablet Take 325-650 mg by mouth every 6 (six) hours as needed. Take 325mg  once daily.  Take 650mg  every 6 hours as needed for pain.   Yes Historical Provider, MD  amLODipine (NORVASC) 10 MG tablet Take 10 mg by mouth daily.   Yes Historical Provider, MD  azithromycin (ZITHROMAX) 250 MG tablet Take 250-500 mg by mouth daily. Take 500mg  for 1 day, then take 250mg  every day for 4 days.  First dose 07/10/2012.   Yes Historical Provider, MD  baclofen (LIORESAL) 10 MG tablet Take 10 mg by mouth 4 (four) times daily.   Yes Historical Provider, MD  divalproex (DEPAKOTE)  250 MG DR tablet Take 250 mg by mouth daily. Take along with one Depakote 500mg  DR tablet to equal a dose of 750mg .   Yes Historical Provider, MD  divalproex (DEPAKOTE) 500 MG DR tablet Take 500-1,000 mg by mouth 2 (two) times daily. Take 500mg  along with 250mg  (to equal 750mg  dose) every morning and take 1,000mg  every night at bedtime.   Yes Historical Provider, MD  enoxaparin (LOVENOX) 40 MG/0.4ML injection Inject 40 mg into the skin daily.   Yes Historical Provider, MD  FeFum-FePoly-FA-B Cmp-C-Biot (INTEGRA PLUS PO) Take 1 capsule by mouth every morning.   Yes Historical Provider, MD  FLUoxetine (PROZAC) 20 MG capsule Take 60 mg by mouth 2 (two) times daily.   Yes Historical Provider, MD  fluticasone (FLONASE) 50 MCG/ACT nasal spray Place 2 sprays into the nose at bedtime. Use for 1 month.  Final dose due 08/12/2012.   Yes Historical Provider, MD  guaiFENesin (MUCINEX) 600 MG 12 hr tablet Take 600 mg by mouth 2 (two) times daily. Take for 10 days.  Last dose due on the morning of 07/20/2012.   Yes Historical Provider, MD  hydrochlorothiazide (HYDRODIURIL) 25 MG tablet Take 25 mg by mouth daily.     Yes Historical Provider, MD  HYDROcodone-acetaminophen (NORCO) 10-325 MG per tablet Take 1 tablet by mouth every 4 (four) hours as  needed. For pain    Yes Historical Provider, MD  iron polysaccharides (NIFEREX) 150 MG capsule Take 150 mg by mouth daily.   Yes Historical Provider, MD  lansoprazole (PREVACID) 30 MG capsule Take 30 mg by mouth 2 (two) times daily.    Yes Historical Provider, MD  latanoprost (XALATAN) 0.005 % ophthalmic solution Place 1 drop into both eyes at bedtime.   Yes Historical Provider, MD  levETIRAcetam (KEPPRA) 1000 MG tablet Take 1,000 mg by mouth 2 (two) times daily.     Yes Historical Provider, MD  lisinopril (PRINIVIL,ZESTRIL) 10 MG tablet Take 10 mg by mouth daily.   Yes Historical Provider, MD  oxyCODONE (OXY IR/ROXICODONE) 5 MG immediate release tablet Take 5 mg by mouth every  6 (six) hours as needed. For pain   Yes Historical Provider, MD  saccharomyces boulardii (FLORASTOR) 250 MG capsule Take 250 mg by mouth 2 (two) times daily. Take for 7 days.  Final dose due on the morning of 07/17/2012.   Yes Historical Provider, MD  tolterodine (DETROL) 2 MG tablet Take 2 mg by mouth at bedtime.   Yes Historical Provider, MD  zolpidem (AMBIEN) 5 MG tablet Take 5 mg by mouth at bedtime as needed. For sleep   Yes Historical Provider, MD     Scheduled Meds:   . antiseptic oral rinse  15 mL Mouth Rinse q12n4p  . baclofen  10 mg Oral QID  . chlorhexidine  15 mL Mouth Rinse BID  . collagenase   Topical Daily  . divalproex  750 mg Oral Daily  . enoxaparin (LOVENOX) injection  40 mg Subcutaneous Q24H  . feeding supplement  237 mL Oral BID BM  . feeding supplement  30 mL Oral BID  . FLUoxetine  60 mg Oral BID  . guaiFENesin  600 mg Oral BID  . iron polysaccharides  150 mg Oral Daily  . latanoprost  1 drop Both Eyes QHS  . levETIRAcetam  1,000 mg Oral BID  . multivitamin with minerals  1 tablet Oral Daily  . nutrition supplement  1 packet Oral BID BM  . oxybutynin  5 mg Oral QHS  . piperacillin-tazobactam (ZOSYN)  IV  3.375 g Intravenous Q8H  . saccharomyces boulardii  250 mg Oral BID  . [COMPLETED] sodium chloride  500 mL Intravenous Once  . vancomycin  1,000 mg Intravenous Q12H  . [DISCONTINUED] FLUoxetine  60 mg Oral BID  . [DISCONTINUED] levetiracetam  1,000 mg Intravenous Q12H  . [DISCONTINUED] levofloxacin (LEVAQUIN) IV  750 mg Intravenous Q24H   Continuous Infusions:   . sodium chloride 125 mL/hr (07/14/12 0019)  . [DISCONTINUED] sodium chloride 125 mL (07/12/12 1726)   PRN Meds:.morphine injection  Allergies:  Allergies  Allergen Reactions  . Ciprofloxacin In D5w Itching  . Minocycline Hcl Itching    Family History  Problem Relation Age of Onset  . Heart failure Mother   . Heart failure Father     Social History:  reports that he has never smoked.  He does not have any smokeless tobacco history on file. He reports that he does not drink alcohol or use illicit drugs.  ROS: A complete review of systems was performed.  All systems are negative except for pertinent findings as noted.  Physical Exam:  Vital signs in last 24 hours: Temp:  [98.2 F (36.8 C)-98.6 F (37 C)] 98.3 F (36.8 C) (12/06 0828) Pulse Rate:  [69-102] 82  (12/06 0828) Resp:  [18-33] 18  (12/06 0828) BP: (96-150)/(42-62)  121/53 mmHg (12/06 0828) SpO2:  [82 %-97 %] 96 % (12/06 0828) FiO2 (%):  [80 %-100 %] 100 % (12/06 0828) General:  Alert and oriented, No acute distress; bipap in place HEENT: Normocephalic, atraumatic Neck: No JVD or lymphadenopathy Cardiovascular: Regular rate and rhythm Lungs: decreased bilateral Abdomen: Soft, nontender, nondistended, no abdominal masses; colostomy in place with soft brown stool in bag; 47f SP tube in place with clear/yellow urine leaking around cath; no skin breakdown on abd. Extremities: 1+ edema Neurologic: paraplegic   Laboratory Data:   Basename 07/14/12 0600 07/13/12 0500 07/12/12 1209  WBC 14.3* 16.5* 15.4*  HGB 8.7* 8.9* 10.0*  HCT 30.2* 29.9* 33.1*  PLT 606* 504* 541*     Basename 07/14/12 0600 07/13/12 0500 07/12/12 1335 07/12/12 1209  NA 143 137 139 134*  K 5.1 5.4* 5.7* NOT DONE  CL 106 102 100 94*  GLUCOSE 111* 95 112* 123*  BUN 31* 34* 37* 37*  CALCIUM 8.7 8.1* 8.3* 8.9  CREATININE 0.76 1.15 1.47* 1.35     Results for orders placed during the hospital encounter of 07/12/12 (from the past 24 hour(s))  PROCALCITONIN     Status: Normal   Collection Time   07/13/12  1:11 PM      Component Value Range   Procalcitonin 0.21    BLOOD GAS, ARTERIAL     Status: Abnormal   Collection Time   07/13/12  7:14 PM      Component Value Range   FIO2 0.80     Delivery systems BILEVEL POSITIVE AIRWAY PRESSURE     Rate 14     Inspiratory PAP 18     Expiratory PAP 10     pH, Arterial 7.217 (*) 7.350 - 7.450    pCO2 arterial 69.4 (*) 35.0 - 45.0 mmHg   pO2, Arterial 89.4  80.0 - 100.0 mmHg   Bicarbonate 27.2 (*) 20.0 - 24.0 mEq/L   TCO2 29.3  0 - 100 mmol/L   Acid-Base Excess 0.2  0.0 - 2.0 mmol/L   O2 Saturation 95.8     Patient temperature 98.6     Collection site RIGHT RADIAL     Drawn by [id number]     Sample type ARTERIAL DRAW     Allens test (pass/fail) PASS  PASS  BLOOD GAS, ARTERIAL     Status: Abnormal   Collection Time   07/13/12  9:00 PM      Component Value Range   FIO2 100.00     Delivery systems BILEVEL POSITIVE AIRWAY PRESSURE     Mode BILEVEL POSITIVE AIRWAY PRESSURE     Inspiratory PAP 18     Expiratory PAP 10     pH, Arterial 7.239 (*) 7.350 - 7.450   pCO2 arterial 66.8 (*) 35.0 - 45.0 mmHg   pO2, Arterial 169.0 (*) 80.0 - 100.0 mmHg   Bicarbonate 27.5 (*) 20.0 - 24.0 mEq/L   TCO2 29.6  0 - 100 mmol/L   Acid-Base Excess 0.9  0.0 - 2.0 mmol/L   O2 Saturation 99.4     Patient temperature 98.6     Collection site RIGHT RADIAL     Drawn by [id number]     Sample type ARTERIAL DRAW     Allens test (pass/fail) PASS  PASS  CBC     Status: Abnormal   Collection Time   07/14/12  6:00 AM      Component Value Range   WBC 14.3 (*) 4.0 - 10.5 K/uL  RBC 3.52 (*) 4.22 - 5.81 MIL/uL   Hemoglobin 8.7 (*) 13.0 - 17.0 g/dL   HCT 11.9 (*) 14.7 - 82.9 %   MCV 85.8  78.0 - 100.0 fL   MCH 24.7 (*) 26.0 - 34.0 pg   MCHC 28.8 (*) 30.0 - 36.0 g/dL   RDW 56.2 (*) 13.0 - 86.5 %   Platelets 606 (*) 150 - 400 K/uL  BASIC METABOLIC PANEL     Status: Abnormal   Collection Time   07/14/12  6:00 AM      Component Value Range   Sodium 143  135 - 145 mEq/L   Potassium 5.1  3.5 - 5.1 mEq/L   Chloride 106  96 - 112 mEq/L   CO2 27  19 - 32 mEq/L   Glucose, Bld 111 (*) 70 - 99 mg/dL   BUN 31 (*) 6 - 23 mg/dL   Creatinine, Ser 7.84  0.50 - 1.35 mg/dL   Calcium 8.7  8.4 - 69.6 mg/dL   GFR calc non Af Amer >90  >90 mL/min   GFR calc Af Amer >90  >90 mL/min   Recent Results (from the past 240  hour(s))  CULTURE, BLOOD (ROUTINE X 2)     Status: Normal (Preliminary result)   Collection Time   07/12/12 12:20 PM      Component Value Range Status Comment   Specimen Description BLOOD ARM RIGHT   Final    Special Requests BOTTLES DRAWN AEROBIC ONLY   Final    Culture  Setup Time 07/12/2012 21:55   Final    Culture     Final    Value:        BLOOD CULTURE RECEIVED NO GROWTH TO DATE CULTURE WILL BE HELD FOR 5 DAYS BEFORE ISSUING A FINAL NEGATIVE REPORT   Report Status PENDING   Incomplete   CULTURE, BLOOD (ROUTINE X 2)     Status: Normal (Preliminary result)   Collection Time   07/12/12 12:44 PM      Component Value Range Status Comment   Specimen Description BLOOD HAND RIGHT   Final    Special Requests BOTTLES DRAWN AEROBIC ONLY 2CC   Final    Culture  Setup Time 07/12/2012 16:52   Final    Culture     Final    Value:        BLOOD CULTURE RECEIVED NO GROWTH TO DATE CULTURE WILL BE HELD FOR 5 DAYS BEFORE ISSUING A FINAL NEGATIVE REPORT   Report Status PENDING   Incomplete   URINE CULTURE     Status: Normal   Collection Time   07/12/12  2:10 PM      Component Value Range Status Comment   Specimen Description URINE, CLEAN CATCH   Final    Special Requests NONE   Final    Culture  Setup Time 07/12/2012 15:02   Final    Colony Count 40,000 COLONIES/ML   Final    Culture     Final    Value: Multiple bacterial morphotypes present, none predominant. Suggest appropriate recollection if clinically indicated.   Report Status 07/13/2012 FINAL   Final   MRSA PCR SCREENING     Status: Normal   Collection Time   07/12/12  5:22 PM      Component Value Range Status Comment   MRSA by PCR NEGATIVE  NEGATIVE Final     Renal Function:  Basename 07/14/12 0600 07/13/12 0500 07/12/12 1335 07/12/12 1209  CREATININE 0.76  1.15 1.47* 1.35   Estimated Creatinine Clearance: 121.4 ml/min (by C-G formula based on Cr of 0.76).  Radiologic Imaging: Ct Head Wo Contrast  07/12/2012  *RADIOLOGY REPORT*   Clinical Data:  Altered mental status  CT HEAD WITHOUT CONTRAST  Technique:  Contiguous axial images were obtained from the base of the skull through the vertex without contrast. Study was obtained within 24 hours of patient arrival at the emergency department.  Comparison: None.  Findings: There is mild diffuse atrophy.  There is no mass, hemorrhage, extra-axial fluid collection, or midline shift.  No focal gray-white compartment lesions are identified.  No acute infarct is appreciable on this study.  Bony calvarium appears intact.  The mastoid air cells are clear.  IMPRESSION: Mild atrophy.  Study otherwise unremarkable.   Original Report Authenticated By: Bretta Bang, M.D.    Dg Chest Port 1 View  07/13/2012  *RADIOLOGY REPORT*  Clinical Data: Acute respiratory distress.  Left pleural effusion. Sepsis.  PORTABLE CHEST - 1 VIEW  Comparison: 07/12/2012  Findings: Large layering left pleural effusion is again seen as well as left lung atelectasis.  Low lung volumes again seen however right lung remains grossly clear.  Heart size remains stable. Mediastinal widening and tracheal deviation to the right is unchanged.  IMPRESSION:  1.  Large layering left pleural effusion on left lung atelectasis, without significant change. 2.  Stable mediastinal widening with tracheal deviation to the right.   Original Report Authenticated By: Myles Rosenthal, M.D.    Dg Chest Port 1 View  07/12/2012  *RADIOLOGY REPORT*  Clinical Data: Unresponsive, pain  PORTABLE CHEST - 1 VIEW  Comparison: 05/06/2010  Findings: Cardiac size is stable.  There is widening  mid mediastinum and left hilar prominence.  Hazy opacification of the left hemithorax may be due to effusion with associated atelectasis or diffuse infiltrates/pneumonia.  Asymmetric pulmonary edema cannot be excluded.  Central mild vascular congestion.  Mild right perihilar interstitial prominence.  IMPRESSION:  There is widening  mid mediastinum and left hilar prominence.  Hazy opacification of the left hemithorax may be due to effusion with associated atelectasis or diffuse infiltrates/pneumonia. Asymmetric pulmonary edema cannot be excluded.  Central mild vascular congestion.  Mild right perihilar interstitial prominence.   Original Report Authenticated By: Natasha Mead, M.D.     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  There are no 28 caths available in the hospital.  Changing to a smaller cath will likely increase leakage, therefore, the cath was not changed.    Plan:  Increase Oxybutynin to 5mg  po TID.  RN is attempting to locate a 54f cath.  If the increase in Oxybutynin does not improve the leakage and a cath can be obtained it can be exchanged.  Continue to change q 1 month.     Silas Flood 07/14/2012, 8:58 AM

## 2012-07-14 NOTE — Progress Notes (Addendum)
Palliative Medicine Team SW Received referral for psychosocial support as pt grappling with EOL issues. GOC with pt's sister Maureen Ralphs today at 4pm. Met with pt who is on BiPAP. Provided supportive presence as pt is in an out of alertness. Pt able to nod yes/no, endorses fear of dying, desire to continue aggressive Ix at this time, but also his understanding that he may pass despite our efforts. Held pt's hand and gave reassurances of medical team's care and attentiveness. Pt aware of scheduled meeting this afternoon and endorses his trust for sister Maureen Ralphs to make decisions for him should he be unable to speak for himself. Pt requesting diet coke and to lie back, but understanding that he cannot with BiPAP and continued desats.  Will continue to follow.    5:07 PM Follow up visit to attend GOC held by PMT Dr. Phillips Odor, Dr. Aviva Signs  with pt's sister Maureen Ralphs and her dtr (pt's niece) Dell. Discussion had with pt and family regarding the gravity of his illness and what are realistic goals for pt. Family discussed pt's journey and multiple ways he has beaten the odds as well as their observed decline in function and quality of life. Pt given education about comfort vs aggressive approach for care and the risk/benefits. Discussed sources of pt's joy such as being outdoors and being able to drink Diet Coke and how these may be difficult to balance with continued attempts at aggressive tx/resusitation. Discussed SNF vs residential hospice and focus on quality vs length of live. Pt struggling to come to a definitive decision about which he would prefer to focus on at this time, but agrees with medical recommendation that a full code status would not likely to afford him the opportunity to reach any of his goals. With pt's permission, later spoke with pt's sister and niece outside the room who express their desire to uphold pt's wishes, but their true desire to avoid unnecessary pain and suffering. Family report very  positive past experiences with Hospice and, if he were to agree, would like to consider a residential hospice facility in [place] in the future. Family are understanding that pt's condition very tenuous and prognosis is poor. Family provided with PMT contact info and appreciative of support. Will continue to support pt in his decision-making process.   Kennieth Francois, LCSWA PMT Phone [address], [place] Pager [phone]

## 2012-07-15 LAB — PROCALCITONIN: Procalcitonin: <0.1 ng/mL

## 2012-07-15 LAB — SEDIMENTATION RATE: Sed Rate: 110 mm/hr — ABNORMAL HIGH (ref 0–16)

## 2012-07-15 NOTE — Progress Notes (Signed)
Physical Therapy Wound Treatment Patient Details  Name: [name] MRN: [id number] Date of Birth: 1950-08-12  Today's Date: 07/15/2012 Time:  -     Subjective  Subjective: Pt on continuous Bi Pap; minimally communicative but aggreeable to hydrotherapy Patient and Family Stated Goals: heal wounds Date of Onset: 07/14/12  Pain Score:    Wound Assessment  Pressure Ulcer 07/14/12 Stage III -  Full thickness tissue loss. Subcutaneous fat may be visible but bone, tendon or muscle are NOT exposed. Sacral wound (bilaterall buttock region wound 1) (Active)  State of Healing Eschar 07/15/2012  2:00 PM  Site / Wound Assessment Bleeding;Black;Pale;Pink;Red;Yellow 07/15/2012  2:00 PM  % Wound base Red or Granulating 20% 07/15/2012  2:00 PM  % Wound base Yellow 30% 07/15/2012  2:00 PM  % Wound base Black 50% 07/15/2012  2:00 PM  Peri-wound Assessment Purple;Pink;Erythema (blanchable) 07/15/2012  2:00 PM  Wound Length (cm) 14 cm 07/14/2012  8:00 AM  Wound Width (cm) 10 cm 07/14/2012  8:00 AM  Wound Depth (cm) 0.2 cm 07/14/2012  8:00 AM  Tunneling (cm) none evident at this time 07/14/2012  8:00 AM  Undermining (cm) none evident at this time 07/14/2012  8:00 AM  Drainage Amount Moderate 07/15/2012  2:00 PM  Drainage Description Serosanguineous;Serous;No odor 07/15/2012  2:00 PM  Treatment Hydrotherapy (Pulse lavage);Debridement (Selective);Packing (Saline gauze);Other (Comment);Tape changed 07/15/2012  2:00 PM  Dressing Type ABD;Barrier Film (skin prep);Moist to dry;Tape dressing 07/15/2012  2:00 PM  Dressing Clean;Dry;Intact 07/15/2012  2:00 PM     Pressure Ulcer 07/14/12 Stage III -  Full thickness tissue loss. Subcutaneous fat may be visible but bone, tendon or muscle are NOT exposed. Sacral wound (proximal wound 2) (Active)  State of Healing Eschar 07/15/2012  2:00 PM  Site / Wound Assessment Bleeding;Black;Red;Yellow;Pink;Pale 07/15/2012  2:00 PM  % Wound base Red or Granulating 20% 07/15/2012  2:00 PM   % Wound base Yellow 40% 07/15/2012  2:00 PM  % Wound base Black 50% 07/15/2012  2:00 PM  Peri-wound Assessment Purple;Pink;Erythema (blanchable) 07/15/2012  2:00 PM  Drainage Amount Minimal 07/15/2012  2:00 PM  Drainage Description Serous;Serosanguineous 07/15/2012  2:00 PM  Treatment Debridement (Selective);Hydrotherapy (Pulse lavage);Packing (Saline gauze);Other (Comment);Tape changed 07/15/2012  2:00 PM  Dressing Type ABD;Barrier Film (skin prep);Moist to dry;Tape dressing 07/15/2012  2:00 PM  Dressing Clean;Dry;Intact 07/15/2012  2:00 PM     Pressure Ulcer 07/14/12 Stage III -  Full thickness tissue loss. Subcutaneous fat may be visible but bone, tendon or muscle are NOT exposed. Tunneling to sacrum (near midline of left buttock cheek) (Active)  State of Healing Other (Comment) 07/15/2012  2:00 PM  Site / Wound Assessment Clean;Dry 07/15/2012  2:00 PM  % Wound base Other (Comment) 100% 07/15/2012  2:00 PM  Peri-wound Assessment Erythema (blanchable) 07/15/2012  2:00 PM  Margins Epibole (rolled edges) 07/15/2012  2:00 PM  Drainage Amount Minimal 07/15/2012  2:00 PM  Drainage Description Serous 07/15/2012  2:00 PM  Treatment Hydrotherapy (Pulse lavage);Other (Comment) 07/15/2012  2:00 PM  Dressing Type Gauze (Comment) 07/15/2012  2:00 PM  Dressing Clean;Dry;Intact 07/15/2012  2:00 PM      Hydrotherapy Pulsed lavage therapy - wound location: 2 sacral wounds and buttock tunnel Pulsed Lavage with Suction (psi): 4 psi Pulsed Lavage with Suction - Normal Saline Used: 1000 mL Pulsed Lavage Tip: Tip with splash shield Selective Debridement Selective Debridement - Location: sacral wounds 1 and 2 Selective Debridement - Tools Used: Forceps;Scalpel;Scissors Selective Debridement - Tissue Removed: large  portions of eschar; some portions of yellow slough   Wound Assessment and Plan  Wound Therapy - Assess/Plan/Recommendations Wound Therapy - Functional Problem List: decreased mobility; increased risk of  infection secondary to poor skin integrity. Factors Delaying/Impairing Wound Healing: Infection - systemic/local;Immobility;Multiple medical problems;Vascular compromise;Incontinence Hydrotherapy Plan: Debridement;Dressing change;Patient/family education;Pulsatile lavage with suction;Other (comment) Wound Therapy - Frequency: 6X / week Wound Therapy - Follow Up Recommendations: Skilled nursing facility Wound Plan: Continue as indicated above  Wound Therapy Goals- Improve the function of patient's integumentary system by progressing the wound(s) through the phases of wound healing (inflammation - proliferation - remodeling) by: Decrease Necrotic Tissue to: 20% Decrease Necrotic Tissue - Progress: Progressing toward goal Increase Granulation Tissue to: 50% Increase Granulation Tissue - Progress: Progressing toward goal Goals/treatment plan/discharge plan were made with and agreed upon by patient/family: Yes Time For Goal Achievement: 7 days Wound Therapy - Potential for Goals: Fair  Goals will be updated until maximal potential achieved or discharge criteria met.  Discharge criteria: when goals achieved, discharge from hospital, MD decision/surgical intervention, no progress towards goals, refusal/missing three consecutive treatments without notification or medical reason.    Verdell Face, [place] [phone] 07/15/2012

## 2012-07-15 NOTE — Progress Notes (Signed)
Pt. Placed back on bipap due to a decrease in O2 sats.

## 2012-07-15 NOTE — Progress Notes (Signed)
[facility] PROGRESS NOTE  AC COLAN ZOX:[id number] DOB: 12/27/1950 DOA: 07/12/2012 PCP: No primary provider on file.  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  Patient was alert and oriented this am and complaining of cough . Around 11:30 am he was poorly responsive and lethargic and shaky. BP was 90/70  Patient at baseline is Well alert and oriented and able to communicate well. He is immobile however is able to turn in bed. As per the nurse he was started on Zyvox and Augmentin of 2 weeks back for possible infection over the coccyx and is still on it. He however did not have any fever. Patient was then transferred to [facility]. In the ED he was placed on BiPAP with a ABG showing hypercapnic respiratory failure. His blood pressure was 72/32 mmhg. her chest x-ray done showed hazy opacification of left hemithorax. [facility] and was called for admission to ICU. However given his multiple comorbidities, PC CM and discussed with patient's sister Maureen Ralphs who recommended to make him DO NOT RESUSCITATE and possible comfort care. Patient was given a dose of IV vancomycin and Zosyn in the ED along with IV fluids and recommended to be admited to step down unit under [facility].  Patient did not complain of any chest pain, shortness of breath, palpitations, abdominal pain, headache, nausea or vomiting. He did not have any diarrhea fever or chills.  Assessment/Plan:   *sepsis associated hypotension/dehydratrion  Due to infected sacral decub and possibly pneumonia.  Hydration has significantly improved his BP however he now  appears to be fluid overloaded.    Acute respiratory failure Pt's resp failure worsened after admission A-- Pneumonia Procalcitonin is only 0.21 which rules out a bacterial pneumonia- likely viral - will check influenza panel  cont Vanc/ Zosyn to cover his wound- will d/c Levaquin today B--  I suspect part of this is due to fluid overload and severe hypoalbuminemia resulting in a  large pleural effusion- unfortunately due to his resp distress we are unable to obtain a CT to r/o a para-pneumonic effusion or empyema.  - once he is able to lay flat, we will obtain a CT Chest. -Improving with diuretics - Now mostly on non-rebreather rather than Bipap- Breathing less labored   Altered mental status/ toxic encephalopathy Resolved- now alert and oriented   Infected Decubitus ulcer of coccygeal region Has had chronic decubitus ulcers with infections- (per prior notes and numerous CTs) - ID consulted - wound care consulted - surgery consulted for further debridement-  Hydrotherapy and Santyl dressing ordered by consultants [facility] requested records from [facility]   H/O CHF Appears to be diastolic dysfunction per echo in 2006 - ECHO done here- reveals an Ef of 55-60% an a mass adherent to the heart- no mention of diastolic dysfunction on this ECHO.    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   Noted on ECHO-  unable to further evaluate this due to current respiratory distress-- will eventually obtain a Ct to evaluate the mass   Hyperkalemia Hydrated - imoproving  UTI Multiple bacterial morphotypes on culture   Anemia On PO Niferex as outpt - will continue   Hypertonic bladder Cont Detrol  Lower paraplegia  due to fall in 1970s  Colostomy   Seizure disorder Cont Keppra and Depakote   Code Status: DNR Family Communication: palliative care in contact with family Disposition Plan: cont to follow in SDU DVT prophylaxis:  Lovenox   Consultants:  surgery  Procedures:  none  Antibiotics:  Vanc- Zosyn- Levaquin - started 12/4  HPI/Subjective: Pt alert- on non-rebreather, feeling better today.   Objective: Filed Vitals:   07/15/12 0737 07/15/12 1030 07/15/12 1143 07/15/12 1449  BP: 98/85     Pulse: 80 82 81 84  Temp:      TempSrc:      Resp: 23 26 28 24   Height:      Weight:      SpO2: 98% 97% 94% 98%    Intake/Output Summary (Last 24 hours) at 07/15/12 1513 Last data filed at 07/15/12 1103  Gross per 24 hour  Intake    503 ml  Output    150 ml  Net    353 ml    Exam:   General:  Alert, moderate distress on Bipap  Cardiovascular: RRR, no murmurs  Respiratory: very decreased breath sounds- pulse ox in 80s when transitioned to NRB.   Abdomen: soft, NT, ND, colostomy intact- suprapubic cath leaking from around the site- no urine going into collecting bag.   Ext: deformities in feet- no edema  Data Reviewed: Basic Metabolic Panel:  Lab 07/14/12 1610 07/13/12 0500 07/12/12 1335 07/12/12 1209  NA 143 137 139 134*  K 5.1 5.4* 5.7* NOT DONE  CL 106 102 100 94*  CO2 27 24 29 26   GLUCOSE 111* 95 112* 123*  BUN 31* 34* 37* 37*  CREATININE 0.76 1.15 1.47* 1.35  CALCIUM 8.7 8.1* 8.3* 8.9  MG -- -- -- --  PHOS -- -- -- --   Liver Function Tests:  Lab 07/13/12 0500 07/12/12 1335 07/12/12 1209  AST 21 17 80*  ALT 25 29 NOT DONE  ALKPHOS 137* 152* NOT DONE  BILITOT 0.2* 0.2* 0.3  PROT 7.1 7.3 8.2  ALBUMIN 1.8* 2.1* 2.2*   No results found for this basename: LIPASE:5,AMYLASE:5 in the last 168 hours No results found for this basename: AMMONIA:5 in the last 168 hours CBC:  Lab 07/14/12 0600 07/13/12 0500 07/12/12 1209  WBC 14.3* 16.5* 15.4*  NEUTROABS -- -- 12.7*  HGB 8.7* 8.9* 10.0*  HCT 30.2* 29.9* 33.1*  MCV 85.8 82.8 80.5  PLT 606* 504* 541*   Cardiac Enzymes: No results found for this basename: CKTOTAL:5,CKMB:5,CKMBINDEX:5,TROPONINI:5 in the last 168 hours BNP  (last 3 results)  Basename 07/12/12 1320  PROBNP 3087.0*   CBG: No results found for this basename: GLUCAP:5 in the last 168 hours  Recent Results (from the past 240 hour(s))  CULTURE, BLOOD (ROUTINE X 2)     Status: Normal (Preliminary result)   Collection Time   07/12/12 12:20 PM      Component Value Range Status Comment   Specimen Description BLOOD ARM RIGHT   Final    Special Requests BOTTLES DRAWN AEROBIC ONLY   Final    Culture  Setup Time 07/12/2012 21:55   Final    Culture     Final    Value:        BLOOD CULTURE RECEIVED NO GROWTH TO DATE CULTURE WILL BE HELD FOR 5 DAYS BEFORE ISSUING A FINAL NEGATIVE REPORT   Report Status PENDING   Incomplete   CULTURE, BLOOD (ROUTINE X 2)  Status: Normal (Preliminary result)   Collection Time   07/12/12 12:44 PM      Component Value Range Status Comment   Specimen Description BLOOD HAND RIGHT   Final    Special Requests BOTTLES DRAWN AEROBIC ONLY 2CC   Final    Culture  Setup Time 07/12/2012 16:52   Final    Culture     Final    Value:        BLOOD CULTURE RECEIVED NO GROWTH TO DATE CULTURE WILL BE HELD FOR 5 DAYS BEFORE ISSUING A FINAL NEGATIVE REPORT   Report Status PENDING   Incomplete   URINE CULTURE     Status: Normal   Collection Time   07/12/12  2:10 PM      Component Value Range Status Comment   Specimen Description URINE, CLEAN CATCH   Final    Special Requests NONE   Final    Culture  Setup Time 07/12/2012 15:02   Final    Colony Count 40,000 COLONIES/ML   Final    Culture     Final    Value: Multiple bacterial morphotypes present, none predominant. Suggest appropriate recollection if clinically indicated.   Report Status 07/13/2012 FINAL   Final   MRSA PCR SCREENING     Status: Normal   Collection Time   07/12/12  5:22 PM      Component Value Range Status Comment   MRSA by PCR NEGATIVE  NEGATIVE Final      Studies: Ct Head Wo Contrast  07/12/2012  *RADIOLOGY REPORT*  Clinical Data:  Altered mental status  CT  HEAD WITHOUT CONTRAST  Technique:  Contiguous axial images were obtained from the base of the skull through the vertex without contrast. Study was obtained within 24 hours of patient arrival at the emergency department.  Comparison: None.  Findings: There is mild diffuse atrophy.  There is no mass, hemorrhage, extra-axial fluid collection, or midline shift.  No focal gray-white compartment lesions are identified.  No acute infarct is appreciable on this study.  Bony calvarium appears intact.  The mastoid air cells are clear.  IMPRESSION: Mild atrophy.  Study otherwise unremarkable.   Original Report Authenticated By: Bretta Bang, M.D.    Dg Chest Port 1 View  07/12/2012  *RADIOLOGY REPORT*  Clinical Data: Unresponsive, pain  PORTABLE CHEST - 1 VIEW  Comparison: 05/06/2010  Findings: Cardiac size is stable.  There is widening  mid mediastinum and left hilar prominence.  Hazy opacification of the left hemithorax may be due to effusion with associated atelectasis or diffuse infiltrates/pneumonia.  Asymmetric pulmonary edema cannot be excluded.  Central mild vascular congestion.  Mild right perihilar interstitial prominence.  IMPRESSION:  There is widening  mid mediastinum and left hilar prominence. Hazy opacification of the left hemithorax may be due to effusion with associated atelectasis or diffuse infiltrates/pneumonia. Asymmetric pulmonary edema cannot be excluded.  Central mild vascular congestion.  Mild right perihilar interstitial prominence.   Original Report Authenticated By: Natasha Mead, M.D.     Scheduled Meds:    . antiseptic oral rinse  15 mL Mouth Rinse q12n4p  . chlorhexidine  15 mL Mouth Rinse BID  . collagenase   Topical Daily  . [COMPLETED] diphenhydrAMINE  25 mg Intravenous Once  . divalproex  750 mg Oral Daily  . enoxaparin (LOVENOX) injection  40 mg Subcutaneous Q24H  . furosemide  80 mg Intravenous Q6H  . latanoprost  1 drop Both Eyes QHS  . levETIRAcetam  1,000 mg  Oral BID  .  oxybutynin  5 mg Oral TID  . piperacillin-tazobactam (ZOSYN)  IV  3.375 g Intravenous Q8H  . saccharomyces boulardii  250 mg Oral BID  . vancomycin  1,250 mg Intravenous Q24H   Continuous Infusions:    ________________________________________________________________________  Time spent: 50 min    [facility]  [facility] Pager [phone] If 8PM-8AM, please contact night-coverage at [email or web address], password [facility] 07/15/2012, 3:13 PM  LOS: 3 days

## 2012-07-16 ENCOUNTER — Inpatient Hospital Stay (HOSPITAL_COMMUNITY): Payer: PRIVATE HEALTH INSURANCE

## 2012-07-16 DIAGNOSIS — Z933 Colostomy status: Secondary | ICD-10-CM

## 2012-07-16 LAB — BASIC METABOLIC PANEL
BUN: 19 mg/dL (ref 6–23)
CO2: 37 mEq/L — ABNORMAL HIGH (ref 19–32)
Chloride: 96 mEq/L (ref 96–112)
Creatinine, Ser: 0.7 mg/dL (ref 0.50–1.35)
Glucose, Bld: 103 mg/dL — ABNORMAL HIGH (ref 70–99)
Potassium: 3.8 mEq/L (ref 3.5–5.1)

## 2012-07-16 LAB — PROCALCITONIN: Procalcitonin: <0.1 ng/mL

## 2012-07-16 LAB — CBC
HCT: 29.1 % — ABNORMAL LOW (ref 39.0–52.0)
Hemoglobin: 8.4 g/dL — ABNORMAL LOW (ref 13.0–17.0)
MCH: 24.1 pg — ABNORMAL LOW (ref 26.0–34.0)
MCHC: 28.9 g/dL — ABNORMAL LOW (ref 30.0–36.0)
MCV: 83.6 fL (ref 78.0–100.0)
Platelets: 505 10*3/uL — ABNORMAL HIGH (ref 150–400)
RBC: 3.48 MIL/uL — ABNORMAL LOW (ref 4.22–5.81)
RDW: 17.1 % — ABNORMAL HIGH (ref 11.5–15.5)
WBC: 8.4 10*3/uL (ref 4.0–10.5)

## 2012-07-16 LAB — STREP PNEUMONIAE URINARY ANTIGEN: Strep Pneumo Urinary Antigen: NEGATIVE

## 2012-07-16 MED ORDER — IOHEXOL 300 MG/ML  SOLN
100.0000 mL | Freq: Once | INTRAMUSCULAR | Status: AC | PRN
  Administered 2012-07-16: 100 mL via INTRAVENOUS

## 2012-07-16 MED ORDER — IOHEXOL 300 MG/ML  SOLN
20.0000 mL | INTRAMUSCULAR | Status: AC
  Administered 2012-07-16 (×2): 20 mL via ORAL

## 2012-07-16 NOTE — Progress Notes (Signed)
Pt wished to be taken off Bi-Pap @ 0512. Pt on 100% NRB. Pt is tolerating well at this time. RT will continue to monitor.

## 2012-07-16 NOTE — Progress Notes (Signed)
Still unable to locate 63F/30cc balloon cath. Dr. Butler Denmark notified. Urology paged s/w Dr. Lisabeth Pick re: leakage and unable  to obtain cath of appropriate size. No new orders received.

## 2012-07-16 NOTE — Progress Notes (Signed)
[facility] PROGRESS NOTE  [name] UJW:[id number] DOB: 01/18/1951 DOA: 07/12/2012 PCP: No primary provider on file.  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  Patient was alert and oriented this am and complaining of cough . Around 11:30 am he was poorly responsive and lethargic and shaky. BP was 90/70  Patient at baseline is Well alert and oriented and able to communicate well. He is immobile however is able to turn in bed. As per the nurse he was started on Zyvox and Augmentin of 2 weeks back for possible infection over the coccyx and is still on it. He however did not have any fever. Patient was then transferred to [facility]. In the ED he was placed on BiPAP with a ABG showing hypercapnic respiratory failure. His blood pressure was 72/32 mmhg. her chest x-ray done showed hazy opacification of left hemithorax. [facility] and was called for admission to ICU. However given his multiple comorbidities, PC CM and discussed with patient's sister Maureen Ralphs who recommended to make him DO NOT RESUSCITATE and possible comfort care. Patient was given a dose of IV vancomycin and Zosyn in the ED along with IV fluids and recommended to be admited to step down unit under [facility] care.  Patient did not complain of any chest pain, shortness of breath, palpitations, abdominal pain, headache, nausea or vomiting. He did not have any diarrhea fever or chills.  Assessment/Plan:   *sepsis associated hypotension/dehydratrion  Due to infected sacral decub and possibly pneumonia.  Hydration has significantly improved his BP however he did  become fluid overloaded.    Acute respiratory failure Pt's resp failure worsened after admission- pulse ox was in low 80s on 100% FiO2.  A-- Pneumonia Procalcitonin is only 0.21 which rules out a bacterial pneumonia- likely viral - will check influenza panel  cont Vanc/ Zosyn to cover his wound-  d/c Levaquin on 12/7 B--  I suspect part of this is due to fluid overload and severe hypoalbuminemia resulting in a  large pleural effusion- unfortunately due to his resp distress we are unable to obtain a CT to r/o a para-pneumonic effusion or empyema.  - I will order CT chest and sacrum (to eval wound) -Improving with diuretics - Now mostly on non-rebreather rather than Bipap- Breathing less labored - unable to measure I and O due to leaking urine   Altered mental status/ toxic encephalopathy Resolved- now alert and oriented   Infected Decubitus ulcer of coccygeal region Has had chronic decubitus ulcers with infections- (per prior notes and numerous CTs) ESR and CRP elevated- therefore, likely has osteo - ID consulted - wound care consulted - surgery consulted for further debridement-  Hydrotherapy and Santyl dressing ordered by consultants Have requested records from [facility]- now that resp status is better, we can further work on wound   H/O CHF Appears to be diastolic dysfunction per echo in 2006 - ECHO done here- reveals an Ef of 55-60% an a mass adherent to the heart- no mention of diastolic dysfunction on this ECHO.  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  Noted on ECHO-  unable to further evaluate this due to current respiratory distress-- will eventually obtain a Ct to evaluate the mass   Hyperkalemia Improved after diuretics  UTI Multiple bacterial morphotypes on culture   Anemia On PO Niferex as outpt - will continue   Hypertonic bladder Cont Detrol  Lower paraplegia   due to fall in 1970s  Colostomy   Seizure disorder Cont Keppra and Depakote   Code Status: DNR Family Communication: palliative care in contact with family Disposition Plan: cont to follow in SDU DVT prophylaxis: Lovenox   Consultants:  surgery  Procedures:  none  Antibiotics:  Vanc- Zosyn- Levaquin - started 12/4  Levaquin stopped on 12/7  HPI/Subjective: Pt alert, good mood- able to breath when laying flat- still has a dry cough.   Objective: Filed Vitals:   07/16/12 1112 07/16/12 1400 07/16/12 1430 07/16/12 1623  BP: 134/63   130/62  Pulse: 89   95  Temp: 97.9 F (36.6 C)   98 F (36.7 C)  TempSrc: Axillary   Oral  Resp: 27   26  Height:      Weight:      SpO2: 93% 76% 96% 94%    Intake/Output Summary (Last 24 hours) at 07/16/12 1716 Last data filed at 07/16/12 1102  Gross per 24 hour  Intake  788.5 ml  Output    455 ml  Net  333.5 ml    Exam:   General:  Alert, no distress  Cardiovascular: RRR, no murmurs  Respiratory: very decreased breath sounds- pulse ox in low 90s on 50% venti mask.   Abdomen: soft, NT, ND, colostomy intact- suprapubic cath leaking from around the site- no urine going into collecting bag.   Ext: deformities in feet- legs immobile  Skin- large ulcer on sacral area- 2 small holes noted over right ischium.   Data Reviewed: Basic Metabolic Panel:  Lab 07/16/12 4098 07/14/12 0600 07/13/12 0500 07/12/12 1335 07/12/12 1209  NA 143 143 137 139 134*  K 3.8 5.1 5.4* 5.7* NOT DONE  CL 96 106 102 100 94*  CO2 37* 27 24 29 26   GLUCOSE 103* 111* 95 112* 123*  BUN 19 31* 34* 37* 37*  CREATININE 0.70 0.76 1.15 1.47* 1.35  CALCIUM 9.2 8.7 8.1* 8.3* 8.9  MG -- -- -- -- --  PHOS -- -- -- -- --   Liver Function Tests:  Lab 07/13/12 0500 07/12/12 1335 07/12/12 1209  AST 21 17 80*  ALT 25 29 NOT DONE  ALKPHOS 137* 152* NOT DONE  BILITOT 0.2* 0.2* 0.3  PROT 7.1 7.3 8.2  ALBUMIN 1.8* 2.1* 2.2*   No results found for this  basename: LIPASE:5,AMYLASE:5 in the last 168 hours No results found for this basename: AMMONIA:5 in the last 168 hours CBC:  Lab 07/16/12 0515 07/14/12 0600 07/13/12 0500 07/12/12 1209  WBC 8.4 14.3* 16.5* 15.4*  NEUTROABS -- -- -- 12.7*  HGB 8.4* 8.7* 8.9* 10.0*  HCT 29.1* 30.2* 29.9* 33.1*  MCV 83.6 85.8 82.8 80.5  PLT 505* 606* 504* 541*   Cardiac Enzymes: No results found for this basename: CKTOTAL:5,CKMB:5,CKMBINDEX:5,TROPONINI:5 in the last 168 hours BNP (last 3 results)  Basename 07/12/12 1320  PROBNP 3087.0*   CBG: No results found for this basename: GLUCAP:5 in the last 168 hours  Recent Results (from the past 240 hour(s))  CULTURE, BLOOD (ROUTINE X 2)     Status: Normal (Preliminary result)   Collection Time   07/12/12 12:20 PM  Component Value Range Status Comment   Specimen Description BLOOD ARM RIGHT   Final    Special Requests BOTTLES DRAWN AEROBIC ONLY   Final    Culture  Setup Time 07/12/2012 21:55   Final    Culture     Final    Value:        BLOOD CULTURE RECEIVED NO GROWTH TO DATE CULTURE WILL BE HELD FOR 5 DAYS BEFORE ISSUING A FINAL NEGATIVE REPORT   Report Status PENDING   Incomplete   CULTURE, BLOOD (ROUTINE X 2)     Status: Normal (Preliminary result)   Collection Time   07/12/12 12:44 PM      Component Value Range Status Comment   Specimen Description BLOOD HAND RIGHT   Final    Special Requests BOTTLES DRAWN AEROBIC ONLY 2CC   Final    Culture  Setup Time 07/12/2012 16:52   Final    Culture     Final    Value:        BLOOD CULTURE RECEIVED NO GROWTH TO DATE CULTURE WILL BE HELD FOR 5 DAYS BEFORE ISSUING A FINAL NEGATIVE REPORT   Report Status PENDING   Incomplete   URINE CULTURE     Status: Normal   Collection Time   07/12/12  2:10 PM      Component Value Range Status Comment   Specimen Description URINE, CLEAN CATCH   Final    Special Requests NONE   Final    Culture  Setup Time 07/12/2012 15:02   Final    Colony Count 40,000  COLONIES/ML   Final    Culture     Final    Value: Multiple bacterial morphotypes present, none predominant. Suggest appropriate recollection if clinically indicated.   Report Status 07/13/2012 FINAL   Final   MRSA PCR SCREENING     Status: Normal   Collection Time   07/12/12  5:22 PM      Component Value Range Status Comment   MRSA by PCR NEGATIVE  NEGATIVE Final      Studies: Ct Head Wo Contrast  07/12/2012  *RADIOLOGY REPORT*  Clinical Data:  Altered mental status  CT HEAD WITHOUT CONTRAST  Technique:  Contiguous axial images were obtained from the base of the skull through the vertex without contrast. Study was obtained within 24 hours of patient arrival at the emergency department.  Comparison: None.  Findings: There is mild diffuse atrophy.  There is no mass, hemorrhage, extra-axial fluid collection, or midline shift.  No focal gray-white compartment lesions are identified.  No acute infarct is appreciable on this study.  Bony calvarium appears intact.  The mastoid air cells are clear.  IMPRESSION: Mild atrophy.  Study otherwise unremarkable.   Original Report Authenticated By: Bretta Bang, M.D.    Dg Chest Port 1 View  07/12/2012  *RADIOLOGY REPORT*  Clinical Data: Unresponsive, pain  PORTABLE CHEST - 1 VIEW  Comparison: 05/06/2010  Findings: Cardiac size is stable.  There is widening  mid mediastinum and left hilar prominence.  Hazy opacification of the left hemithorax may be due to effusion with associated atelectasis or diffuse infiltrates/pneumonia.  Asymmetric pulmonary edema cannot be excluded.  Central mild vascular congestion.  Mild right perihilar interstitial prominence.  IMPRESSION:  There is widening  mid mediastinum and left hilar prominence. Hazy opacification of the left hemithorax may be due to effusion with associated atelectasis or diffuse infiltrates/pneumonia. Asymmetric pulmonary edema cannot be excluded.  Central mild vascular congestion.  Mild right perihilar  interstitial prominence.   Original Report Authenticated By: Natasha Mead, M.D.     Scheduled Meds:    . antiseptic oral rinse  15 mL Mouth Rinse q12n4p  . chlorhexidine  15 mL Mouth Rinse BID  . collagenase   Topical Daily  . divalproex  750 mg Oral Daily  . enoxaparin (LOVENOX) injection  40 mg Subcutaneous Q24H  . furosemide  80 mg Intravenous Q6H  . latanoprost  1 drop Both Eyes QHS  . levETIRAcetam  1,000 mg Oral BID  . oxybutynin  5 mg Oral TID  . piperacillin-tazobactam (ZOSYN)  IV  3.375 g Intravenous Q8H  . saccharomyces boulardii  250 mg Oral BID  . vancomycin  1,250 mg Intravenous Q24H   Continuous Infusions:    ________________________________________________________________________  Time spent: 30 min    [facility]  [facility] Pager [phone] If 8PM-8AM, please contact night-coverage at [email or web address], password [email or web address] 07/16/2012, 5:16 PM  LOS: 4 days

## 2012-07-17 ENCOUNTER — Inpatient Hospital Stay (HOSPITAL_COMMUNITY): Payer: PRIVATE HEALTH INSURANCE

## 2012-07-17 LAB — LEGIONELLA ANTIGEN, URINE

## 2012-07-17 LAB — BASIC METABOLIC PANEL
CO2: 43 mEq/L (ref 19–32)
Chloride: 87 mEq/L — ABNORMAL LOW (ref 96–112)
GFR calc non Af Amer: >90 mL/min (ref >90)
Glucose, Bld: 80 mg/dL (ref 70–99)
Potassium: 3.4 mEq/L — ABNORMAL LOW (ref 3.5–5.1)
Sodium: 142 mEq/L (ref 135–145)

## 2012-07-17 LAB — INFLUENZA PANEL BY PCR (TYPE A & B): Influenza A By PCR: NEGATIVE

## 2012-07-17 MED ORDER — SODIUM CHLORIDE 0.9 % IJ SOLN: 10.0000 mL | INTRAMUSCULAR | Status: DC | PRN

## 2012-07-17 MED ORDER — ACETAMINOPHEN 325 MG PO TABS: 650.0000 mg | ORAL_TABLET | Freq: Four times a day (QID) | ORAL | Status: DC | PRN

## 2012-07-17 MED ORDER — SODIUM CHLORIDE 0.9 % IJ SOLN
10.0000 mL | Freq: Two times a day (BID) | INTRAMUSCULAR | Status: DC
  Administered 2012-07-18: 10 mL
  Administered 2012-07-19: 20 mL

## 2012-07-17 MED ORDER — ENSURE COMPLETE PO LIQD
237.0000 mL | Freq: Two times a day (BID) | ORAL | Status: DC
  Administered 2012-07-17 – 2012-07-19 (×3): 237 mL via ORAL

## 2012-07-17 MED ORDER — PIPERACILLIN-TAZOBACTAM 3.375 G IVPB
3.3750 g | Freq: Three times a day (TID) | INTRAVENOUS | Status: AC
Start: 2012-07-17 — End: 2012-07-19
  Administered 2012-07-17 – 2012-07-18 (×4): 3.375 g via INTRAVENOUS
  Filled 2012-07-17 (×4): qty 50

## 2012-07-17 MED ORDER — OXYCODONE HCL 5 MG PO TABS
5.0000 mg | ORAL_TABLET | ORAL | Status: DC | PRN
  Administered 2012-07-17: 5 mg via ORAL
  Administered 2012-07-18: 10 mg via ORAL
  Administered 2012-07-18: 5 mg via ORAL
  Administered 2012-07-18 (×2): 10 mg via ORAL
  Filled 2012-07-17: qty 2
  Filled 2012-07-17: qty 1
  Filled 2012-07-17: qty 2
  Filled 2012-07-17: qty 1
  Filled 2012-07-17: qty 2

## 2012-07-17 NOTE — Progress Notes (Signed)
PT HYDROTHERAPY PROGRESS NOTE   07/17/12 1100  Subjective Assessment  Subjective Pt on non-rebreather and appears in better spirits today and more conversational.  Patient and Family Stated Goals heal wounds  Date of Onset 07/14/12  Evaluation and Treatment  Evaluation and Treatment Procedures Explained to Patient/Family Yes  Evaluation and Treatment Procedures agreed to  Pressure Ulcer 07/14/12 Stage III -  Full thickness tissue loss. Subcutaneous fat may be visible but bone, tendon or muscle are NOT exposed. Sacral wound (bilaterall buttock region wound 1)  Date First Assessed/Time First Assessed: 07/14/12 1000   Location: Sacrum  Staging: Stage III -  Full thickness tissue loss. Subcutaneous fat may be visible but bone, tendon or muscle are NOT exposed.  Wound Description (Comments): Sacral wound (bilatera  State of Healing Eschar  Site / Wound Assessment Bleeding;Black;Pale;Pink;Red;Yellow  % Wound base Red or Granulating 20%  % Wound base Yellow 30%  % Wound base Black 50%  Peri-wound Assessment Purple;Pink;Erythema (blanchable)  Drainage Amount Moderate  Drainage Description Serosanguineous;Serous;No odor  Treatment Hydrotherapy (Pulse lavage);Debridement (Selective);Packing (Saline gauze);Other (Comment);Tape changed (santyl)  Dressing Type ABD;Barrier Film (skin prep);Moist to dry;Tape dressing  Dressing Clean;Dry;Intact  Pressure Ulcer 07/14/12 Stage III -  Full thickness tissue loss. Subcutaneous fat may be visible but bone, tendon or muscle are NOT exposed. Sacral wound (proximal wound 2)  Date First Assessed/Time First Assessed: 07/14/12 1000   Location: Sacrum  Staging: Stage III -  Full thickness tissue loss. Subcutaneous fat may be visible but bone, tendon or muscle are NOT exposed.  Wound Description (Comments): Sacral wound (proximal  State of Healing Eschar  Site / Wound Assessment Bleeding;Black;Red;Yellow;Pink;Pale  % Wound base Red or Granulating 30%  % Wound base  Yellow 40%  % Wound base Black 30%  Peri-wound Assessment Purple;Pink;Erythema (blanchable)  Drainage Amount Minimal  Drainage Description Serous;Serosanguineous  Treatment Debridement (Selective);Hydrotherapy (Pulse lavage);Packing (Saline gauze);Other (Comment);Tape changed (santyl )  Dressing Type ABD;Barrier Film (skin prep);Moist to dry;Tape dressing  Dressing Clean;Dry;Intact  Pressure Ulcer 07/14/12 Stage III -  Full thickness tissue loss. Subcutaneous fat may be visible but bone, tendon or muscle are NOT exposed. Tunneling to sacrum (near midline of left buttock cheek)  Date First Assessed/Time First Assessed: 07/14/12 1000   Location: Buttocks  Staging: Stage III -  Full thickness tissue loss. Subcutaneous fat may be visible but bone, tendon or muscle are NOT exposed.  Wound Description (Comments): Tunneling to sacrum   State of Healing Other (Comment) (full tunneling wound)  Site / Wound Assessment Clean;Dry  % Wound base Other (Comment) 100%  Peri-wound Assessment Erythema (blanchable)  Margins Epibole (rolled edges)  Drainage Amount Minimal  Drainage Description Serous  Treatment Hydrotherapy (Pulse lavage);Other (Comment) (santyl ointment)  Dressing Type Gauze (Comment)  Dressing Clean;Dry;Intact  Hydrotherapy  Pulsed Lavage with Suction (psi) 4 psi  Pulsed Lavage with Suction - Normal Saline Used 1000 mL  Pulsed Lavage Tip Tip with splash shield  Pulsed lavage therapy - wound location 2 sacral wounds and buttock tunnel  Selective Debridement  Selective Debridement - Location sacral wounds 1 and 2  Selective Debridement - Tools Used Forceps;Scalpel;Scissors  Selective Debridement - Tissue Removed large portions of eschar; some portions of yellow slough  Wound Therapy - Assess/Plan/Recommendations  Wound Therapy - Clinical Statement Pt tolerated hydrotherapy well with stable vitals.  Significant portions of black eschar debrided from wound sites today with minimal  bleeding. Granulation tissue appears to be increasing.  Will conitnue to debride as tolerated and  continue use of santyl to facilitate further debridement of necrotic tissue.   Wound Therapy - Functional Problem List decreased mobility; increased risk of infection secondary to poor skin integrity.  Factors Delaying/Impairing Wound Healing Infection - systemic/local;Immobility;Multiple medical problems;Vascular compromise;Incontinence  Hydrotherapy Plan Debridement;Dressing change;Patient/family education;Pulsatile lavage with suction;Other (comment)  Wound Therapy - Frequency 6X / week  Wound Therapy - Follow Up Recommendations Skilled nursing facility  Wound Plan Continue as indicated above  Wound Therapy Goals - Improve the function of patient's integumentary system by progressing the wound(s) through the phases of wound healing by:  Decrease Necrotic Tissue to 20%  Decrease Necrotic Tissue - Progress Progressing toward goal  Increase Granulation Tissue to 50%  Increase Granulation Tissue - Progress Progressing toward goal  Goals/treatment plan/discharge plan were made with and agreed upon by patient/family Yes  Time For Goal Achievement 7 days  Wound Therapy - Potential for Goals Fair     Charlotte Crumb, PT DPT  [phone]

## 2012-07-17 NOTE — Progress Notes (Signed)
[facility] PROGRESS NOTE  [name] ZOX:[id number] DOB: Apr 27, 1951 DOA: 07/12/2012 PCP: No primary provider on file.  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 Patient was alert and oriented and complaining of cough then later he was poorly responsive and lethargic and shaky. BP was 90/70.  Patient at baseline is alert and oriented and able to communicate well. He is immobile however is able to turn in bed. As per the nurse he was started on Zyvox and Augmentin 2 weeks back for possible infection over the coccyx and is still on it. He however did not have any fever. Patient was then transferred to [facility]. In the ED he was placed on BiPAP with a ABG showing hypercapnic respiratory failure. His blood pressure was 72/32 mm Hg - chest x-ray done showed hazy opacification of left hemithorax. Ppatient's sister Maureen Ralphs recommended to make him DO NOT RESUSCITATE and possible comfort care. Patient was given a dose of IV vancomycin and Zosyn in the ED along with IV fluids.  Assessment/Plan:  sepsis associated hypotension/dehydratrion  Due to infected sacral decub and possibly pneumonia - Hydration has significantly improved his BP   Acute respiratory failure due to:     A-- Pneumonia *Procalcitonin was only 0.21 which rules out a bacterial pneumonia - likely viral -  influenza panel was negative *cont Zosyn to cover his wound - d/c Levaquin on 12/7 - discontinue vancomycin today - see below     B-- Bilateral pleural effusions *Secondary to fluid overload and severe hypoalbuminemia resulting in a large pleural effusion (septated  appearance on CT of the chest) - doubt would benefit from therapeutic thoracentesis *Improved with diuretics - Now mostly on non-rebreather rather than Bipap  *unable to measure I and O due to leaking urine *CT scan also shows small area and right mainstem which is subcentimeter and ?partial mucous plug vs/ bronchogenic CA  Altered mental status/ toxic encephalopathy *Resolved - now alert and oriented  Infected Decubitus ulcer of coccygeal region *Has had chronic decubitus ulcers with infections - (per prior notes and numerous CTs) *ESR and CRP elevated - therefore, likely has osteo especially in setting of any exposed bone *ID consulted - MRSA screening negative so discontinue vancomycin *wound care consulted *surgery consulted for further debridement - per their assessments no indication to pursue surgical debridement at this time *Hydrotherapy and Santyl dressing ordered by consultants  H/O CHF *Appears to be diastolic dysfunction per echo in 2006 - no mention of diastolic dysfunction on repeat ECHO his hospitalization.  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 Of UTI *Multiple bacterial morphotypes on culture but likely contaminants   Anemia *On PO Niferex as outpt - continue  Hypertonic bladder *Cont Detrol -still  leaking around suprapubic catheter despite largest catheter available inserted into tract  Paraplegia due to injury in 1970s  Colostomy  Seizure disorder Cont Keppra and Depakote  Code  Status: DNR Family Communication: palliative care in contact with family Disposition Plan: cont to follow in SDU DVT prophylaxis: Lovenox  Consultants:  Surgery  Infectious disease telephone consultation  Procedures:  none  Antibiotics:  Vanc- Zosyn- Levaquin - started 12/4  Levaquin stopped on 12/7  Vancomycin stopped 12/9  HPI/Subjective: Alert and endorses improved respiratory pattern and primarily complaining of buttock/sacral wound pain. Denies chest pain or shortness of breath.   Objective: Filed Vitals:   07/17/12 0731 07/17/12 0800 07/17/12 0900 07/17/12 1111  BP: 124/54 131/71 108/57 108/54  Pulse: 86 96 75 95  Temp: 97.8 F (36.6 C)   97.6 F (36.4 C)  TempSrc: Oral   Oral  Resp: 18 24 23 23   Height:      Weight:      SpO2: 99% 94% 61% 94%    Intake/Output Summary (Last 24 hours) at 07/17/12 1239 Last data filed at 07/17/12 0900  Gross per 24 hour  Intake    576 ml  Output    375 ml  Net    201 ml    Exam:   General:  Alert, no distress  Cardiovascular: RRR, no murmurs  Respiratory: very decreased breath sounds - pulse ox in low 90s on 50% partial re-breather mask.   Abdomen: soft, NT, ND, colostomy intact- suprapubic cath leaking from around the site- no urine going into collecting bag.   Ext: deformities in feet- legs immobile  Skin- large ulcer on sacral area- 2 small holes noted over right ischium.   Data Reviewed: Basic Metabolic Panel:  Lab 07/17/12 1610 07/16/12 0515 07/14/12 0600 07/13/12 0500 07/12/12 1335  NA 142 143 143 137 139  K 3.4* 3.8 5.1 5.4* 5.7*  CL 87* 96 106 102 100  CO2 43* 37* 27 24 29   GLUCOSE 80 103* 111* 95 112*  BUN 17 19 31* 34* 37*  CREATININE 0.77 0.70 0.76 1.15 1.47*  CALCIUM 9.2 9.2 8.7 8.1* 8.3*  MG -- -- -- -- --  PHOS -- -- -- -- --   Liver Function Tests:  Lab 07/13/12 0500 07/12/12 1335 07/12/12 1209  AST 21 17 80*  ALT 25 29 NOT DONE  ALKPHOS 137* 152* NOT DONE  BILITOT 0.2* 0.2* 0.3   PROT 7.1 7.3 8.2  ALBUMIN 1.8* 2.1* 2.2*   CBC:  Lab 07/16/12 0515 07/14/12 0600 07/13/12 0500 07/12/12 1209  WBC 8.4 14.3* 16.5* 15.4*  NEUTROABS -- -- -- 12.7*  HGB 8.4* 8.7* 8.9* 10.0*  HCT 29.1* 30.2* 29.9* 33.1*  MCV 83.6 85.8 82.8 80.5  PLT 505* 606* 504* 541*   BNP (last 3 results)  Basename 07/12/12 1320  PROBNP 3087.0*    Recent Results (from the past 240 hour(s))  CULTURE, BLOOD (ROUTINE X 2)     Status: Normal (Preliminary result)   Collection Time   07/12/12 12:20 PM      Component Value Range Status Comment   Specimen Description BLOOD ARM RIGHT   Final    Special Requests BOTTLES DRAWN AEROBIC ONLY   Final    Culture  Setup Time 07/12/2012 21:55   Final    Culture     Final    Value:        BLOOD CULTURE RECEIVED NO GROWTH TO DATE CULTURE WILL BE HELD FOR 5 DAYS  BEFORE ISSUING A FINAL NEGATIVE REPORT   Report Status PENDING   Incomplete   CULTURE, BLOOD (ROUTINE X 2)     Status: Normal (Preliminary result)   Collection Time   07/12/12 12:44 PM      Component Value Range Status Comment   Specimen Description BLOOD HAND RIGHT   Final    Special Requests BOTTLES DRAWN AEROBIC ONLY 2CC   Final    Culture  Setup Time 07/12/2012 16:52   Final    Culture     Final    Value:        BLOOD CULTURE RECEIVED NO GROWTH TO DATE CULTURE WILL BE HELD FOR 5 DAYS BEFORE ISSUING A FINAL NEGATIVE REPORT   Report Status PENDING   Incomplete   URINE CULTURE     Status: Normal   Collection Time   07/12/12  2:10 PM      Component Value Range Status Comment   Specimen Description URINE, CLEAN CATCH   Final    Special Requests NONE   Final    Culture  Setup Time 07/12/2012 15:02   Final    Colony Count 40,000 COLONIES/ML   Final    Culture     Final    Value: Multiple bacterial morphotypes present, none predominant. Suggest appropriate recollection if clinically indicated.   Report Status 07/13/2012 FINAL   Final   MRSA PCR SCREENING     Status: Normal   Collection Time    07/12/12  5:22 PM      Component Value Range Status Comment   MRSA by PCR NEGATIVE  NEGATIVE Final     Studies: Reviewed by MD  Scheduled Meds: Reviewed by MD _____________________________________________________________________  Junious Silk, ANP [facility] Pager [phone] If 8PM-8AM, please contact night-coverage at [email or web address], password [facility] 07/17/2012, 12:39 PM  LOS: 5 days    I have personally examined this patient and reviewed the entire database. I have reviewed the above note, made any necessary editorial changes, and agree with its content.  Lonia Blood, MD [facility]

## 2012-07-17 NOTE — Progress Notes (Signed)
Nutrition Follow-up  Intervention:   1. Add Ensure Complete po BID, each supplement provides 350 kcal and 13 grams of protein. 2. If pt desires aggressive care, recommend adding Juven 1 packet PO BID, 30 ml Prostat PO BID, and MVI. 3. [name] to continue to follow nutrition care plan  Assessment:   Palliative medicine team consulted for code status and goals of care. Per chart, pt is considering options, with goals to "get better" and return to SNF. All nutrition interventions previously ordered by this [name] were discontinued. Pt enjoys Ensure Complete supplements, will add them.  Diet Order:  Heart Healthy diet  Meds: Scheduled Meds:   . antiseptic oral rinse  15 mL Mouth Rinse q12n4p  . chlorhexidine  15 mL Mouth Rinse BID  . collagenase   Topical Daily  . divalproex  750 mg Oral Daily  . enoxaparin (LOVENOX) injection  40 mg Subcutaneous Q24H  . furosemide  80 mg Intravenous Q6H  . [COMPLETED] iohexol  20 mL Oral Q1 Hr x 2  . latanoprost  1 drop Both Eyes QHS  . levETIRAcetam  1,000 mg Oral BID  . oxybutynin  5 mg Oral TID  . piperacillin-tazobactam (ZOSYN)  IV  3.375 g Intravenous Q8H  . saccharomyces boulardii  250 mg Oral BID  . [DISCONTINUED] vancomycin  1,250 mg Intravenous Q24H   Continuous Infusions:  PRN Meds:.[COMPLETED] iohexol, morphine injection   CMP     Component Value Date/Time   NA 142 07/17/2012 0700   K 3.4* 07/17/2012 0700   CL 87* 07/17/2012 0700   CO2 43* 07/17/2012 0700   GLUCOSE 80 07/17/2012 0700   BUN 17 07/17/2012 0700   CREATININE 0.77 07/17/2012 0700   CALCIUM 9.2 07/17/2012 0700   PROT 7.1 07/13/2012 0500   ALBUMIN 1.8* 07/13/2012 0500   AST 21 07/13/2012 0500   ALT 25 07/13/2012 0500   ALKPHOS 137* 07/13/2012 0500   BILITOT 0.2* 07/13/2012 0500   GFRNONAA >90 07/17/2012 0700   GFRAA >90 07/17/2012 0700    CBG (last 3)  No results found for this basename: GLUCAP:3 in the last 72 hours   Intake/Output Summary (Last 24 hours) at 07/17/12 1225 Last data  filed at 07/17/12 0900  Gross per 24 hour  Intake    576 ml  Output    375 ml  Net    201 ml  BM 12/7   Weight Status:  224 lb - stable  Body mass index is 29.80 kg/(m^2). Overweight.  Estimated needs:  2000 - 2300 kcal, 95 - 120 grams protein  Nutrition Dx:  Increased nutrient needs r/t extensive wounds AEB estimated needs.  Goal:  Pt to meet >/= 90% of their estimated nutrition needs  Monitor:  weight trends, lab trends, I/O's, PO intake, supplement tolerance  [name], [name], [name] Pager: [phone] After-hours pager: [phone]

## 2012-07-17 NOTE — Progress Notes (Signed)
Subjective: Patient having hydrotherapy, eschar has been removed underlying tissue is well perfused, depth under eschar was superficial. He does have a open area on the right Ishium region of unknown depth and this is suspected to go done to the bone as does another tract on his right ishium area per PT.  Objective: Vital signs in last 24 hours: Temp:  [97.7 F (36.5 C)-98.2 F (36.8 C)] 97.8 F (36.6 C) (12/09 0731) Pulse Rate:  [75-96] 75  (12/09 0900) Resp:  [18-27] 23  (12/09 0900) BP: (108-134)/(54-71) 108/57 mmHg (12/09 0900) SpO2:  [61 %-100 %] 61 % (12/09 0900) FiO2 (%):  [50 %] 50 % (12/09 0731) Weight:  [224 lb 13.9 oz (102 kg)] 224 lb 13.9 oz (102 kg) (12/09 0300) Last BM Date: 07/15/12  Intake/Output from previous day: 12/08 0701 - 12/09 0700 In: 796.5 [P.O.:460; IV Piggyback:336.5] Out: 375 [Urine:25; Stool:350] Intake/Output this shift: Total I/O In: 265 [P.O.:240; IV Piggyback:25] Out: 0   General appearance: alert, cooperative, appears stated age and no distress Sacral wound as described above, some capillary bleeding with hydrotherapy, easily stopped.  Lab Results:   [facility] 07/16/12 0515  WBC 8.4  HGB 8.4*  HCT 29.1*  PLT 505*   BMET  Basename 07/17/12 0700 07/16/12 0515  NA 142 143  K 3.4* 3.8  CL 87* 96  CO2 43* 37*  GLUCOSE 80 103*  BUN 17 19  CREATININE 0.77 0.70  CALCIUM 9.2 9.2   PT/INR No results found for this basename: LABPROT:2,INR:2 in the last 72 hours ABG No results found for this basename: PHART:2,PCO2:2,PO2:2,HCO3:2 in the last 72 hours  Studies/Results: Ct Chest W Contrast  07/16/2012  *RADIOLOGY REPORT*  Clinical Data:  Respiratory distress.  Mass seen on echo.  Cancer workup.  CT CHEST, ABDOMEN AND PELVIS WITH CONTRAST  Technique:  Multidetector CT imaging of the chest, abdomen and pelvis was performed following the standard protocol during bolus administration of intravenous contrast.  Contrast: OMNIPAQUE IOHEXOL  300 MG/ML  SOLN  Comparison:  CT abdomen and pelvis 10/08/2008.  CT CHEST  Findings:  Moderate sized bilateral pleural effusions.  On the left, pleural effusions appear to have some septation and pleural thickening which may suggest loculation.  Pleural mass or complex fluid are not excluded.  There is bilateral lower lobe atelectasis with decreased aeration in the upper and middle lungs.  Occult parenchymal mass is not excluded. Intraluminal appearing nodule in the right mainstem bronchus measuring 8 mm diameter.  Endobronchial lesion should be excluded.  Mild cardiac enlargement.  Normal caliber thoracic aorta. Prominent central pulmonary arteries may suggest pulmonary arterial hypertension.  Coronary artery calcification.  No mediastinal fluid collections.  Scattered prominent lymph nodes are demonstrated in the anterior mediastinum, pretracheal, retrotracheal, subcarinal, and AP window regions.  Lymph nodes measure up to a maximum of about 11 mm short axis dimension.  These are nonspecific and could be reactive lymph nodes.  Early metastatic disease is not excluded. The esophagus is decompressed.  There is extensive degenerative change with prominent hypertrophic changes and ankylosis of the mid and lower thoracolumbar spine and wedge compression deformity of a lower thoracic vertebra with associated kyphosis.  No acute fracture is suspected. No destructive bone lesions appreciated.  IMPRESSION: Bilateral pleural effusions with lower lobe atelectasis.  Left pleural effusions suggest loculation with pleural thickening. Pleural mass not excluded.  Nonspecific lymph nodes throughout the mediastinum may be reactive or early metastatic.  Endobronchial appearing lesion in the right mainstem bronchus.  Extensive degenerative changes in the spine.  CT ABDOMEN AND PELVIS  Findings:  Heterogeneous appearing sub xyphoid mass measuring 7.9 x 5.7 by 7.5 cm.  This has enlarged since the previous study from 2010.  Interval  enlargement of the lesions is 2010 suggests a benign lesion versus low grade malignancy.  This could represent a primary bone tumor or soft tissue tumor.  The liver, spleen, pancreas, adrenal glands, abdominal aorta, and retroperitoneal lymph nodes are unremarkable except for aortic calcification.  Infrarenal inferior vena caval filter in place. Surgical absence of the gallbladder.  The there is scarring and irregularity of the lower pole of the left kidney with wall thickening and infiltration around the left renal pelvis.  This appearance is similar to the previous study and could represent infiltrative process.  Pyelonephritis or transitional cell neoplasm could have this appearance.  No hydronephrosis.  The stomach, small bowel, and colon are not abnormally distended.  No discrete wall thickening.  Prominent visceral adipose tissues.  No free air or free fluid in the abdomen.  Left lower quadrant ostomy.  Pelvis:  Suprapubic catheter in the bladder.  Mild bladder wall thickening.  Cystitis versus bladder infection are suggested.  No free or loculated pelvic fluid collections.  No significant pelvic lymphadenopathy.  Mild prominence of lymph nodes in the groin regions are likely reactive.  Extensive heterotopic ossification around the hips.  Skin thickening over the gluteal regions suggesting infiltration or edema.  Prominent degenerative changes in the lumbar spine with hypertrophic ankylosis.  IMPRESSION: Heterogeneous appearing sub xyphoid mass is enlarging since 2010 suggesting possible low grade malignancy.  A abnormal-appearing lower pole of left kidney and left renal pelvis may suggest inflammatory process or transitional cell neoplasm.  This is similar to previous study.  Bladder wall thickening likely due to hypertrophic cystitis or urinary tract infection.  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   Original Report  Authenticated By: Burman Nieves, M.D.    Ct Abdomen Pelvis W Contrast  07/16/2012  *RADIOLOGY REPORT*  Clinical Data:  Respiratory distress.  Mass seen on echo.  Cancer workup.  CT CHEST, ABDOMEN AND PELVIS WITH CONTRAST  Technique:  Multidetector CT imaging of the chest, abdomen and pelvis was performed following the standard protocol during bolus administration of intravenous contrast.  Contrast: OMNIPAQUE IOHEXOL 300 MG/ML  SOLN  Comparison:  CT abdomen and pelvis 10/08/2008.  CT CHEST  Findings:  Moderate sized bilateral pleural effusions.  On the left, pleural effusions appear to have some septation and pleural thickening which may suggest loculation.  Pleural mass or complex fluid are not excluded.  There is bilateral lower lobe atelectasis with decreased aeration in the upper and middle lungs.  Occult parenchymal mass is not excluded. Intraluminal appearing nodule in the right mainstem bronchus measuring 8 mm diameter.  Endobronchial lesion should be excluded.  Mild cardiac enlargement.  Normal caliber thoracic aorta. Prominent central pulmonary arteries may suggest pulmonary arterial hypertension.  Coronary artery calcification.  No mediastinal fluid collections.  Scattered prominent lymph nodes are demonstrated in the anterior mediastinum, pretracheal, retrotracheal, subcarinal, and AP window regions.  Lymph nodes measure up to a maximum of about 11 mm short axis dimension.  These are nonspecific and could be reactive lymph nodes.  Early metastatic disease is not excluded. The esophagus is decompressed.  There is extensive degenerative change with prominent hypertrophic changes and ankylosis of the mid and lower thoracolumbar spine  and wedge compression deformity of a lower thoracic vertebra with associated kyphosis.  No acute fracture is suspected. No destructive bone lesions appreciated.  IMPRESSION: Bilateral pleural effusions with lower lobe atelectasis.  Left pleural effusions suggest  loculation with pleural thickening. Pleural mass not excluded.  Nonspecific lymph nodes throughout the mediastinum may be reactive or early metastatic.  Endobronchial appearing lesion in the right mainstem bronchus.  Extensive degenerative changes in the spine.  CT ABDOMEN AND PELVIS  Findings:  Heterogeneous appearing sub xyphoid mass measuring 7.9 x 5.7 by 7.5 cm.  This has enlarged since the previous study from 2010.  Interval enlargement of the lesions is 2010 suggests a benign lesion versus low grade malignancy.  This could represent a primary bone tumor or soft tissue tumor.  The liver, spleen, pancreas, adrenal glands, abdominal aorta, and retroperitoneal lymph nodes are unremarkable except for aortic calcification.  Infrarenal inferior vena caval filter in place. Surgical absence of the gallbladder.  The there is scarring and irregularity of the lower pole of the left kidney with wall thickening and infiltration around the left renal pelvis.  This appearance is similar to the previous study and could represent infiltrative process.  Pyelonephritis or transitional cell neoplasm could have this appearance.  No hydronephrosis.  The stomach, small bowel, and colon are not abnormally distended.  No discrete wall thickening.  Prominent visceral adipose tissues.  No free air or free fluid in the abdomen.  Left lower quadrant ostomy.  Pelvis:  Suprapubic catheter in the bladder.  Mild bladder wall thickening.  Cystitis versus bladder infection are suggested.  No free or loculated pelvic fluid collections.  No significant pelvic lymphadenopathy.  Mild prominence of lymph nodes in the groin regions are likely reactive.  Extensive heterotopic ossification around the hips.  Skin thickening over the gluteal regions suggesting infiltration or edema.  Prominent degenerative changes in the lumbar spine with hypertrophic ankylosis.  IMPRESSION: Heterogeneous appearing sub xyphoid mass is enlarging since 2010 suggesting  possible low grade malignancy.  A abnormal-appearing lower pole of left kidney and left renal pelvis may suggest inflammatory process or transitional cell neoplasm.  This is similar to previous study.  Bladder wall thickening likely due to hypertrophic cystitis or urinary tract infection.  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   Original Report Authenticated By: Burman Nieves, M.D.    Dg Chest Port 1 View  07/16/2012  *RADIOLOGY REPORT*  Clinical Data: Respiratory failure.  PORTABLE CHEST - 1 VIEW  Comparison: 07/13/2012  Findings: Portable semi upright view of the chest was obtained. Again noted is a large left pleural effusion.  There are prominent densities along the left side of the mediastinum which could be related to a pleural disease but cannot exclude lymphadenopathy. The patient continues to have low lung volumes and suspect pulmonary edema.  Heart is obscured by the basilar lung densities. There is a vascular stent in the right upper chest.  IMPRESSION: There is persistent left pleural and parenchymal disease most likely related to a large pleural effusion.  Please note that lymphadenopathy cannot be excluded.  Recommend continued follow-up.  Low lung volumes and suspect pulmonary edema.   Original Report Authenticated By: Richarda Overlie, M.D.     Anti-infectives: Anti-infectives     Start     Dose/Rate Route Frequency Ordered Stop   07/15/12 0800   vancomycin (VANCOCIN) 1,250 mg in sodium chloride 0.9 % 250 mL IVPB  Status:  Discontinued        1,250 mg 166.7 mL/hr over 90 Minutes Intravenous Every 24 hours 07/14/12 1352 07/17/12 0932   07/13/12 0000   vancomycin (VANCOCIN) IVPB 1000 mg/200 mL premix  Status:  Discontinued        1,000 mg 200 mL/hr over 60 Minutes Intravenous Every 12 hours 07/12/12 1429 07/14/12 1352   07/12/12 2200   piperacillin-tazobactam (ZOSYN) IVPB 3.375 g  Status:  Discontinued         3.375 g 12.5 mL/hr over 240 Minutes Intravenous 3 times per day 07/12/12 1429 07/12/12 1939   07/12/12 2000  piperacillin-tazobactam (ZOSYN) IVPB 3.375 g       3.375 g 12.5 mL/hr over 240 Minutes Intravenous Every 8 hours 07/12/12 1939     07/12/12 1745   levofloxacin (LEVAQUIN) IVPB 750 mg  Status:  Discontinued        750 mg 100 mL/hr over 90 Minutes Intravenous Every 24 hours 07/12/12 1711 07/13/12 1654   07/12/12 1715   piperacillin-tazobactam (ZOSYN) IVPB 3.375 g  Status:  Discontinued        3.375 g 12.5 mL/hr over 240 Minutes Intravenous 3 times per day 07/12/12 1711 07/12/12 1717   07/12/12 1430   vancomycin (VANCOCIN) 1,500 mg in sodium chloride 0.9 % 500 mL IVPB        1,500 mg 250 mL/hr over 120 Minutes Intravenous  Once 07/12/12 1429 07/12/12 1652   07/12/12 1400   vancomycin (VANCOCIN) IVPB 1000 mg/200 mL premix  Status:  Discontinued        1,000 mg 200 mL/hr over 60 Minutes Intravenous  Once 07/12/12 1353 07/12/12 1427   07/12/12 1400  piperacillin-tazobactam (ZOSYN) IVPB 3.375 g       3.375 g 12.5 mL/hr over 240 Minutes Intravenous  Once 07/12/12 1353 07/12/12 1453          Assessment/Plan:   Patient Active Problem List  Diagnosis  . Acute respiratory failure  . Sepsis associated hypotension  . Altered mental status  . H/O CHF  . Lower paraplegia  . Seizure disorder  . Decubitus ulcer of coccygeal region  . Hyperkalemia  . Anemia  . Healthcare-associated pneumonia  . Hypotension  . Hypertonic bladder  . Colostomy in place  . Pleural effusion on left  s/p * No surgery found * Plan:  1. Continue wound care as scheduled 2. Management per medicine 3. ? LTAC placement   LOS: 5 days    Blenda Mounts ACNP [facility] Surgery Pager#[phone] 07/17/2012

## 2012-07-17 NOTE — Progress Notes (Signed)
PMT SW PMT DO and I attempted follow up visit with pt, but in sterile procedure. Chart reviewed, note pt off bipap. Spoke briefly with sister Maureen Ralphs who reports plan to come back to hospital tomorrow (family lives in [place]. [place]). Family hopeful to reenage with pt about his consideration of options discussed in GOC. For now, per GOC discussion, pt to continue with full aggressive care in setting of DNR until further decisions are made. Will continue to follow pt. \  Kennieth Francois, [facility] Team Phone [phone]

## 2012-07-17 NOTE — Progress Notes (Signed)
CRITICAL VALUE ALERT  Critical value received:  CO2 43  Date of notification:  07/17/12  Time of notification:  0850hrs  Critical value read back:yes  Nurse who received alert:  [id number]  MD notified (1st page):  Dr. Sharon Seller  Time of first page:  0905hrs  MD notified (2nd page):  Time of second page:  Responding MD:  Dr. Sharon Seller  Time MD responded:  [phone]

## 2012-07-17 NOTE — Progress Notes (Signed)
Pt IV dated.  Dr. Sharon Seller ordered PICC line due to poor access. IV team tried twice to place PICC line without success.  Dr. Sharon Seller notified and order for IR to place PICC.  IR notified, will not be able to place until tomorrow.  Current IV site CDI and left in place due to limited access.

## 2012-07-17 NOTE — Progress Notes (Signed)
[facility]  Days of antibiotics # 6 Days of Vancomycin #6 Days of zosyn # 6  Two days of levaquin  Subjective: Pt still having trouble with oxygenation, ventilation going on and off Bipap  Antibiotics:  Anti-infectives     Start     Dose/Rate Route Frequency Ordered Stop   07/15/12 0800   vancomycin (VANCOCIN) 1,250 mg in sodium chloride 0.9 % 250 mL IVPB  Status:  Discontinued        1,250 mg 166.7 mL/hr over 90 Minutes Intravenous Every 24 hours 07/14/12 1352 07/17/12 0932   07/13/12 0000   vancomycin (VANCOCIN) IVPB 1000 mg/200 mL premix  Status:  Discontinued        1,000 mg 200 mL/hr over 60 Minutes Intravenous Every 12 hours 07/12/12 1429 07/14/12 1352   07/12/12 2200   piperacillin-tazobactam (ZOSYN) IVPB 3.375 g  Status:  Discontinued        3.375 g 12.5 mL/hr over 240 Minutes Intravenous 3 times per day 07/12/12 1429 07/12/12 1939   07/12/12 2000  piperacillin-tazobactam (ZOSYN) IVPB 3.375 g       3.375 g 12.5 mL/hr over 240 Minutes Intravenous Every 8 hours 07/12/12 1939     07/12/12 1745   levofloxacin (LEVAQUIN) IVPB 750 mg  Status:  Discontinued        750 mg 100 mL/hr over 90 Minutes Intravenous Every 24 hours 07/12/12 1711 07/13/12 1654   07/12/12 1715   piperacillin-tazobactam (ZOSYN) IVPB 3.375 g  Status:  Discontinued        3.375 g 12.5 mL/hr over 240 Minutes Intravenous 3 times per day 07/12/12 1711 07/12/12 1717   07/12/12 1430   vancomycin (VANCOCIN) 1,500 mg in sodium chloride 0.9 % 500 mL IVPB        1,500 mg 250 mL/hr over 120 Minutes Intravenous  Once 07/12/12 1429 07/12/12 1652   07/12/12 1400   vancomycin (VANCOCIN) IVPB 1000 mg/200 mL premix  Status:  Discontinued        1,000 mg 200 mL/hr over 60 Minutes Intravenous  Once 07/12/12 1353 07/12/12 1427   07/12/12 1400  piperacillin-tazobactam (ZOSYN) IVPB 3.375 g       3.375 g 12.5 mL/hr over 240 Minutes Intravenous  Once 07/12/12 1353 07/12/12 1453           Medications: Scheduled Meds:    . antiseptic oral rinse  15 mL Mouth Rinse q12n4p  . chlorhexidine  15 mL Mouth Rinse BID  . collagenase   Topical Daily  . divalproex  750 mg Oral Daily  . enoxaparin (LOVENOX) injection  40 mg Subcutaneous Q24H  . feeding supplement  237 mL Oral BID BM  . furosemide  80 mg Intravenous Q6H  . [COMPLETED] iohexol  20 mL Oral Q1 Hr x 2  . latanoprost  1 drop Both Eyes QHS  . levETIRAcetam  1,000 mg Oral BID  . oxybutynin  5 mg Oral TID  . piperacillin-tazobactam (ZOSYN)  IV  3.375 g Intravenous Q8H  . saccharomyces boulardii  250 mg Oral BID  . sodium chloride  10-40 mL Intracatheter Q12H  . [DISCONTINUED] vancomycin  1,250 mg Intravenous Q24H   Continuous Infusions:  PRN Meds:.[COMPLETED] iohexol, morphine injection, sodium chloride   Objective: Weight change:   Intake/Output Summary (Last 24 hours) at 07/17/12 1529 Last data filed at 07/17/12 1408  Gross per 24 hour  Intake    874 ml  Output    375 ml  Net  499 ml   Blood pressure 108/54, pulse 95, temperature 97.6 F (36.4 C), temperature source Oral, resp. rate 23, height 6' 0.84" (1.85 m), weight 224 lb 13.9 oz (102 kg), SpO2 94.00%. Temp:  [97.6 F (36.4 C)-98.2 F (36.8 C)] 97.6 F (36.4 C) (12/09 1111) Pulse Rate:  [75-96] 95  (12/09 1111) Resp:  [18-26] 23  (12/09 1111) BP: (108-131)/(54-71) 108/54 mmHg (12/09 1111) SpO2:  [61 %-100 %] 94 % (12/09 1111) FiO2 (%):  [50 %] 50 % (12/09 1111) Weight:  [224 lb 13.9 oz (102 kg)] 224 lb 13.9 oz (102 kg) (12/09 0300)  Physical Exam: General: Alert and awake, oriented x3,   HEENT: anicteric sclera, pupils reactive to light and accommodation, EOMI, oropharynx clear and without exudate  CVS regular rate, normal r, no murmur rubs or gallops  Chest: Rhonchi heard.  Abdomen: soft nontender, nondistended, normal bowel sounds, S2 with dark pasty stool. Suprapubic catheter placement in place.  Skin: he has several areas of necrotic  skin posteriorly, he has former graft site that is intact, he has macerated skin near where urine is leaking. His open fistula does tunnel to bone as wound care Rns showed me.  Neuro: Paraplegia   Lab Results:  [facility] 07/16/12 0515  WBC 8.4  HGB 8.4*  HCT 29.1*  PLT 505*    BMET  Basename 07/17/12 0700 07/16/12 0515  NA 142 143  K 3.4* 3.8  CL 87* 96  CO2 43* 37*  GLUCOSE 80 103*  BUN 17 19  CREATININE 0.77 0.70  CALCIUM 9.2 9.2    Micro Results: Recent Results (from the past 240 hour(s))  CULTURE, BLOOD (ROUTINE X 2)     Status: Normal (Preliminary result)   Collection Time   07/12/12 12:20 PM      Component Value Range Status Comment   Specimen Description BLOOD ARM RIGHT   Final    Special Requests BOTTLES DRAWN AEROBIC ONLY   Final    Culture  Setup Time 07/12/2012 21:55   Final    Culture     Final    Value:        BLOOD CULTURE RECEIVED NO GROWTH TO DATE CULTURE WILL BE HELD FOR 5 DAYS BEFORE ISSUING A FINAL NEGATIVE REPORT   Report Status PENDING   Incomplete   CULTURE, BLOOD (ROUTINE X 2)     Status: Normal (Preliminary result)   Collection Time   07/12/12 12:44 PM      Component Value Range Status Comment   Specimen Description BLOOD HAND RIGHT   Final    Special Requests BOTTLES DRAWN AEROBIC ONLY 2CC   Final    Culture  Setup Time 07/12/2012 16:52   Final    Culture     Final    Value:        BLOOD CULTURE RECEIVED NO GROWTH TO DATE CULTURE WILL BE HELD FOR 5 DAYS BEFORE ISSUING A FINAL NEGATIVE REPORT   Report Status PENDING   Incomplete   URINE CULTURE     Status: Normal   Collection Time   07/12/12  2:10 PM      Component Value Range Status Comment   Specimen Description URINE, CLEAN CATCH   Final    Special Requests NONE   Final    Culture  Setup Time 07/12/2012 15:02   Final    Colony Count 40,000 COLONIES/ML   Final    Culture     Final    Value: Multiple bacterial  morphotypes present, none predominant. Suggest appropriate recollection if  clinically indicated.   Report Status 07/13/2012 FINAL   Final   MRSA PCR SCREENING     Status: Normal   Collection Time   07/12/12  5:22 PM      Component Value Range Status Comment   MRSA by PCR NEGATIVE  NEGATIVE Final     Studies/Results: Ct Chest W Contrast  07/16/2012  *RADIOLOGY REPORT*  Clinical Data:  Respiratory distress.  Mass seen on echo.  Cancer workup.  CT CHEST, ABDOMEN AND PELVIS WITH CONTRAST  Technique:  Multidetector CT imaging of the chest, abdomen and pelvis was performed following the standard protocol during bolus administration of intravenous contrast.  Contrast: OMNIPAQUE IOHEXOL 300 MG/ML  SOLN  Comparison:  CT abdomen and pelvis 10/08/2008.  CT CHEST  Findings:  Moderate sized bilateral pleural effusions.  On the left, pleural effusions appear to have some septation and pleural thickening which may suggest loculation.  Pleural mass or complex fluid are not excluded.  There is bilateral lower lobe atelectasis with decreased aeration in the upper and middle lungs.  Occult parenchymal mass is not excluded. Intraluminal appearing nodule in the right mainstem bronchus measuring 8 mm diameter.  Endobronchial lesion should be excluded.  Mild cardiac enlargement.  Normal caliber thoracic aorta. Prominent central pulmonary arteries may suggest pulmonary arterial hypertension.  Coronary artery calcification.  No mediastinal fluid collections.  Scattered prominent lymph nodes are demonstrated in the anterior mediastinum, pretracheal, retrotracheal, subcarinal, and AP window regions.  Lymph nodes measure up to a maximum of about 11 mm short axis dimension.  These are nonspecific and could be reactive lymph nodes.  Early metastatic disease is not excluded. The esophagus is decompressed.  There is extensive degenerative change with prominent hypertrophic changes and ankylosis of the mid and lower thoracolumbar spine and wedge compression deformity of a lower thoracic vertebra with  associated kyphosis.  No acute fracture is suspected. No destructive bone lesions appreciated.  IMPRESSION: Bilateral pleural effusions with lower lobe atelectasis.  Left pleural effusions suggest loculation with pleural thickening. Pleural mass not excluded.  Nonspecific lymph nodes throughout the mediastinum may be reactive or early metastatic.  Endobronchial appearing lesion in the right mainstem bronchus.  Extensive degenerative changes in the spine.  CT ABDOMEN AND PELVIS  Findings:  Heterogeneous appearing sub xyphoid mass measuring 7.9 x 5.7 by 7.5 cm.  This has enlarged since the previous study from 2010.  Interval enlargement of the lesions is 2010 suggests a benign lesion versus low grade malignancy.  This could represent a primary bone tumor or soft tissue tumor.  The liver, spleen, pancreas, adrenal glands, abdominal aorta, and retroperitoneal lymph nodes are unremarkable except for aortic calcification.  Infrarenal inferior vena caval filter in place. Surgical absence of the gallbladder.  The there is scarring and irregularity of the lower pole of the left kidney with wall thickening and infiltration around the left renal pelvis.  This appearance is similar to the previous study and could represent infiltrative process.  Pyelonephritis or transitional cell neoplasm could have this appearance.  No hydronephrosis.  The stomach, small bowel, and colon are not abnormally distended.  No discrete wall thickening.  Prominent visceral adipose tissues.  No free air or free fluid in the abdomen.  Left lower quadrant ostomy.  Pelvis:  Suprapubic catheter in the bladder.  Mild bladder wall thickening.  Cystitis versus bladder infection are suggested.  No free or loculated pelvic fluid collections.  No significant  pelvic lymphadenopathy.  Mild prominence of lymph nodes in the groin regions are likely reactive.  Extensive heterotopic ossification around the hips.  Skin thickening over the gluteal regions suggesting  infiltration or edema.  Prominent degenerative changes in the lumbar spine with hypertrophic ankylosis.  IMPRESSION: Heterogeneous appearing sub xyphoid mass is enlarging since 2010 suggesting possible low grade malignancy.  A abnormal-appearing lower pole of left kidney and left renal pelvis may suggest inflammatory process or transitional cell neoplasm.  This is similar to previous study.  Bladder wall thickening likely due to hypertrophic cystitis or urinary tract infection.  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   Original Report Authenticated By: Burman Nieves, M.D.    Ct Abdomen Pelvis W Contrast  07/16/2012  *RADIOLOGY REPORT*  Clinical Data:  Respiratory distress.  Mass seen on echo.  Cancer workup.  CT CHEST, ABDOMEN AND PELVIS WITH CONTRAST  Technique:  Multidetector CT imaging of the chest, abdomen and pelvis was performed following the standard protocol during bolus administration of intravenous contrast.  Contrast: OMNIPAQUE IOHEXOL 300 MG/ML  SOLN  Comparison:  CT abdomen and pelvis 10/08/2008.  CT CHEST  Findings:  Moderate sized bilateral pleural effusions.  On the left, pleural effusions appear to have some septation and pleural thickening which may suggest loculation.  Pleural mass or complex fluid are not excluded.  There is bilateral lower lobe atelectasis with decreased aeration in the upper and middle lungs.  Occult parenchymal mass is not excluded. Intraluminal appearing nodule in the right mainstem bronchus measuring 8 mm diameter.  Endobronchial lesion should be excluded.  Mild cardiac enlargement.  Normal caliber thoracic aorta. Prominent central pulmonary arteries may suggest pulmonary arterial hypertension.  Coronary artery calcification.  No mediastinal fluid collections.  Scattered prominent lymph nodes are demonstrated in the anterior mediastinum, pretracheal, retrotracheal, subcarinal, and AP  window regions.  Lymph nodes measure up to a maximum of about 11 mm short axis dimension.  These are nonspecific and could be reactive lymph nodes.  Early metastatic disease is not excluded. The esophagus is decompressed.  There is extensive degenerative change with prominent hypertrophic changes and ankylosis of the mid and lower thoracolumbar spine and wedge compression deformity of a lower thoracic vertebra with associated kyphosis.  No acute fracture is suspected. No destructive bone lesions appreciated.  IMPRESSION: Bilateral pleural effusions with lower lobe atelectasis.  Left pleural effusions suggest loculation with pleural thickening. Pleural mass not excluded.  Nonspecific lymph nodes throughout the mediastinum may be reactive or early metastatic.  Endobronchial appearing lesion in the right mainstem bronchus.  Extensive degenerative changes in the spine.  CT ABDOMEN AND PELVIS  Findings:  Heterogeneous appearing sub xyphoid mass measuring 7.9 x 5.7 by 7.5 cm.  This has enlarged since the previous study from 2010.  Interval enlargement of the lesions is 2010 suggests a benign lesion versus low grade malignancy.  This could represent a primary bone tumor or soft tissue tumor.  The liver, spleen, pancreas, adrenal glands, abdominal aorta, and retroperitoneal lymph nodes are unremarkable except for aortic calcification.  Infrarenal inferior vena caval filter in place. Surgical absence of the gallbladder.  The there is scarring and irregularity of the lower pole of the left kidney with wall thickening and infiltration around the left renal pelvis.  This appearance is similar to the previous study and could represent infiltrative process.  Pyelonephritis or transitional cell neoplasm could have this appearance.  No  hydronephrosis.  The stomach, small bowel, and colon are not abnormally distended.  No discrete wall thickening.  Prominent visceral adipose tissues.  No free air or free fluid in the abdomen.  Left  lower quadrant ostomy.  Pelvis:  Suprapubic catheter in the bladder.  Mild bladder wall thickening.  Cystitis versus bladder infection are suggested.  No free or loculated pelvic fluid collections.  No significant pelvic lymphadenopathy.  Mild prominence of lymph nodes in the groin regions are likely reactive.  Extensive heterotopic ossification around the hips.  Skin thickening over the gluteal regions suggesting infiltration or edema.  Prominent degenerative changes in the lumbar spine with hypertrophic ankylosis.  IMPRESSION: Heterogeneous appearing sub xyphoid mass is enlarging since 2010 suggesting possible low grade malignancy.  A abnormal-appearing lower pole of left kidney and left renal pelvis may suggest inflammatory process or transitional cell neoplasm.  This is similar to previous study.  Bladder wall thickening likely due to hypertrophic cystitis or urinary tract infection.  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   Original Report Authenticated By: Burman Nieves, M.D.    Dg Chest Port 1 View  07/17/2012  *RADIOLOGY REPORT*  Clinical Data: Line placement.  PORTABLE CHEST - 1 VIEW  Comparison: 07/16/2012 CT and chest x-ray.  Findings: It appears a right PICC line has been placed and curls upon itself in the region axilla and does not enter central vasculature.  This needs to be repositioned.  Subclavian stent.  Cardiomegaly.  Pulmonary vascular congestion/edema.  Pleural effusions greater left.  Lower lobe atelectasis/infiltrate greater on the left unchanged.  Prominence of mediastinum.  Please see recent CT report.  IMPRESSION: It appears a right PICC line has been placed and curls upon itself in the region axilla and does not enter central vasculature.  This needs to be repositioned.  This has been made a PRA call report utilizing dashboard call feature.   Original Report Authenticated By: Lacy Duverney, M.D.    Dg  Chest Port 1 View  07/16/2012  *RADIOLOGY REPORT*  Clinical Data: Respiratory failure.  PORTABLE CHEST - 1 VIEW  Comparison: 07/13/2012  Findings: Portable semi upright view of the chest was obtained. Again noted is a large left pleural effusion.  There are prominent densities along the left side of the mediastinum which could be related to a pleural disease but cannot exclude lymphadenopathy. The patient continues to have low lung volumes and suspect pulmonary edema.  Heart is obscured by the basilar lung densities. There is a vascular stent in the right upper chest.  IMPRESSION: There is persistent left pleural and parenchymal disease most likely related to a large pleural effusion.  Please note that lymphadenopathy cannot be excluded.  Recommend continued follow-up.  Low lung volumes and suspect pulmonary edema.   Original Report Authenticated By: Richarda Overlie, M.D.       Assessment/Plan: [name] is a 61 y.o. male with multiple medical problems paraplegia a large decubitus ulcers status post debridement at [facility] having been on recent Augmentin and Zyvox, admitted with acute apparent sepsis with hypotensive and confusion that has responded well to broad-spectrum antibiotics in the form of vancomycin Zosyn and levofloxacin. Her multiple possible sources including his lungs where he also has large pleural effusion and his deep decubitus ulcer that may go to bone.   #1 sepsis: He has responded well to broad-spectrum antibiotics and is now had 6 days of very broad spectrum  abx. His CT is not revealing for acute infection of bone or joint. I am operating under assumption that lungs are the primary problem here --DC vancomycin is reasoanble --I would dc zosyn after one more day as well --if he worsens off antibioitics would tap the pleural effusion   #2 possible pneumonia: has pleural effusion as above. 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 I do not think he needs aggressive approach at this point in tmie and would NOT give him prolonged antibiotics  I will sign off at this time please call with further questions.       LOS: 5 days   Acey Lav 07/17/2012, 3:29 PM

## 2012-07-17 NOTE — Progress Notes (Signed)
Peripherally Inserted Central Catheter/Midline Placement  The IV Nurse has discussed with the patient and/or persons authorized to consent for the patient, the purpose of this procedure and the potential benefits and risks involved with this procedure.  The benefits include less needle sticks, lab draws from the catheter and patient may be discharged home with the catheter.  Risks include, but not limited to, infection, bleeding, blood clot (thrombus formation), and puncture of an artery; nerve damage and irregular heat beat.  Alternatives to this procedure were also discussed.  PICC/Midline Placement Documentation  PICC / Midline Double Lumen 07/17/12 PICC Right Brachial (Active)       Stacie Glaze Horton 07/17/2012, 12:46 PM

## 2012-07-17 NOTE — Progress Notes (Signed)
Agree.  Wound care

## 2012-07-17 NOTE — Progress Notes (Signed)
ANTIBIOTIC CONSULT NOTE - Follow up  Pharmacy Consult for Vancomycin, Zosyn Indication: pneumonia  Allergies  Allergen Reactions  . Ciprofloxacin In D5w Itching  . Minocycline Hcl Itching    Patient Measurements: Weight = 102 kg   Labs:  Basename 07/16/12 0515  WBC 8.4  HGB 8.4*  PLT 505*  LABCREA --  CREATININE 0.70   Estimated Creatinine Clearance: 121.4 ml/min (by C-G formula based on Cr of 0.7).  Basename 07/14/12 1140  VANCOTROUGH 24.2*  VANCOPEAK --  VANCORANDOM --  GENTTROUGH --  GENTPEAK --  GENTRANDOM --  TOBRATROUGH --  TOBRAPEAK --  TOBRARND --  AMIKACINPEAK --  AMIKACINTROU --  AMIKACIN --     Microbiology: Recent Results (from the past 720 hour(s))  CULTURE, BLOOD (ROUTINE X 2)     Status: Normal (Preliminary result)   Collection Time   07/12/12 12:20 PM      Component Value Range Status Comment   Specimen Description BLOOD ARM RIGHT   Final    Special Requests BOTTLES DRAWN AEROBIC ONLY   Final    Culture  Setup Time 07/12/2012 21:55   Final    Culture     Final    Value:        BLOOD CULTURE RECEIVED NO GROWTH TO DATE CULTURE WILL BE HELD FOR 5 DAYS BEFORE ISSUING A FINAL NEGATIVE REPORT   Report Status PENDING   Incomplete   CULTURE, BLOOD (ROUTINE X 2)     Status: Normal (Preliminary result)   Collection Time   07/12/12 12:44 PM      Component Value Range Status Comment   Specimen Description BLOOD HAND RIGHT   Final    Special Requests BOTTLES DRAWN AEROBIC ONLY 2CC   Final    Culture  Setup Time 07/12/2012 16:52   Final    Culture     Final    Value:        BLOOD CULTURE RECEIVED NO GROWTH TO DATE CULTURE WILL BE HELD FOR 5 DAYS BEFORE ISSUING A FINAL NEGATIVE REPORT   Report Status PENDING   Incomplete   URINE CULTURE     Status: Normal   Collection Time   07/12/12  2:10 PM      Component Value Range Status Comment   Specimen Description URINE, CLEAN CATCH   Final    Special Requests NONE   Final    Culture  Setup Time  07/12/2012 15:02   Final    Colony Count 40,000 COLONIES/ML   Final    Culture     Final    Value: Multiple bacterial morphotypes present, none predominant. Suggest appropriate recollection if clinically indicated.   Report Status 07/13/2012 FINAL   Final   MRSA PCR SCREENING     Status: Normal   Collection Time   07/12/12  5:22 PM      Component Value Range Status Comment   MRSA by PCR NEGATIVE  NEGATIVE Final     Medical History: Past Medical History  Diagnosis Date  . Coronary artery disease   . CHF (congestive heart failure)   . Paraplegia   . GERD (gastroesophageal reflux disease)   . Seizure    Assessment: 61 year old with a history of paraplegia, CAD, CHF, and seizures beginning empiric antibiotics for pneumonia. Trough obtained on 12/6 was 24 and dose was decreased.  Renal function remains stable.  Goal of Therapy:  Vancomycin trough level 15-20 mcg/ml Appropriate Zosyn dosing  Plan:  1) Zosyn  3.375 grams iv Q 8 hours (4 hr infusion) 2) Continue vancomycin 1.25g IV q24.  Will repeat trough later this week.  Celedonio Miyamoto, PharmD, BCPS Clinical Pharmacist Pager [phone]

## 2012-07-18 ENCOUNTER — Inpatient Hospital Stay (HOSPITAL_COMMUNITY): Payer: PRIVATE HEALTH INSURANCE

## 2012-07-18 MED ORDER — IOHEXOL 300 MG/ML  SOLN
50.0000 mL | Freq: Once | INTRAMUSCULAR | Status: AC | PRN
  Administered 2012-07-18: 10 mL via INTRAVENOUS

## 2012-07-18 NOTE — Progress Notes (Signed)
PT HYDROTHERAPY PROGRESS NOTE   07/18/12 1300  Subjective Assessment  Subjective Pt on nasal cannula and is very pleasent this morning. Pt states "I am feeling much better"  Patient and Family Stated Goals heal wounds  Date of Onset 07/14/12  Evaluation and Treatment  Evaluation and Treatment Procedures Explained to Patient/Family Yes  Evaluation and Treatment Procedures agreed to  Pressure Ulcer 07/14/12 Stage III -  Full thickness tissue loss. Subcutaneous fat may be visible but bone, tendon or muscle are NOT exposed. Sacral wound (bilaterall buttock region wound 1)  Date First Assessed/Time First Assessed: 07/14/12 1000   Location: Sacrum  Staging: Stage III -  Full thickness tissue loss. Subcutaneous fat may be visible but bone, tendon or muscle are NOT exposed.  Wound Description (Comments): Sacral wound (bilatera  State of Healing Eschar  Site / Wound Assessment Bleeding;Black;Pale;Pink;Red;Yellow  % Wound base Red or Granulating 50%  % Wound base Yellow 30%  % Wound base Black 20%  Peri-wound Assessment Purple;Pink;Erythema (blanchable)  Drainage Amount Moderate  Drainage Description Serosanguineous;Serous;No odor  Treatment Hydrotherapy (Pulse lavage);Debridement (Selective);Packing (Saline gauze);Other (Comment);Tape changed (Santyl)  Dressing Type ABD;Barrier Film (skin prep);Moist to dry;Tape dressing  Dressing Clean;Dry;Intact  Pressure Ulcer 07/14/12 Stage III -  Full thickness tissue loss. Subcutaneous fat may be visible but bone, tendon or muscle are NOT exposed. Sacral wound (proximal wound 2)  Date First Assessed/Time First Assessed: 07/14/12 1000   Location: Sacrum  Staging: Stage III -  Full thickness tissue loss. Subcutaneous fat may be visible but bone, tendon or muscle are NOT exposed.  Wound Description (Comments): Sacral wound (proximal  State of Healing Eschar  Site / Wound Assessment Bleeding;Black;Red;Yellow;Pink;Pale  % Wound base Red or Granulating 45%  %  Wound base Yellow 40%  % Wound base Black 15%  Peri-wound Assessment Purple;Pink;Erythema (blanchable)  Drainage Amount Minimal  Drainage Description Serous;Serosanguineous  Treatment Debridement (Selective);Hydrotherapy (Pulse lavage);Packing (Saline gauze);Other (Comment);Tape changed (santyl)  Dressing Type ABD;Barrier Film (skin prep);Moist to dry;Tape dressing  Dressing Clean;Dry;Intact  Pressure Ulcer 07/14/12 Stage III -  Full thickness tissue loss. Subcutaneous fat may be visible but bone, tendon or muscle are NOT exposed. Tunneling to sacrum (near midline of left buttock cheek)  Date First Assessed/Time First Assessed: 07/14/12 1000   Location: Buttocks  Staging: Stage III -  Full thickness tissue loss. Subcutaneous fat may be visible but bone, tendon or muscle are NOT exposed.  Wound Description (Comments): Tunneling to sacrum   State of Healing Other (Comment) (full tunneling wound)  Site / Wound Assessment Clean;Dry  % Wound base Other (Comment) 100%  Peri-wound Assessment Erythema (blanchable)  Margins Epibole (rolled edges)  Drainage Amount Minimal  Drainage Description Serous  Treatment Hydrotherapy (Pulse lavage);Other (Comment) (santyl)  Dressing Type Gauze (Comment)  Dressing Clean;Dry;Intact  Hydrotherapy  Pulsed Lavage with Suction (psi) 4 psi  Pulsed Lavage with Suction - Normal Saline Used 2000 mL  Pulsed Lavage Tip Tip with splash shield  Pulsed lavage therapy - wound location 2 sacral wounds and buttock tunnel  Selective Debridement  Selective Debridement - Location sacral wounds 1 and 2  Selective Debridement - Tools Used Forceps;Scalpel;Scissors  Selective Debridement - Tissue Removed large portions of eschar; some portions of yellow slough  Wound Therapy - Assess/Plan/Recommendations  Wound Therapy - Clinical Statement Increased cc's to 2000 for debridement.  Patient demonstrates steady progress in wound healing and is responding well to hydrotherapy and  debridement procedures. There is a decrease in non-viable tissue and appearance  of increased granulation in wound beds. Will continue hydrotherapy as indicated to facilitate further healing.  Wound Therapy - Functional Problem List decreased mobility; increased risk of infection secondary to poor skin integrity.  Factors Delaying/Impairing Wound Healing Infection - systemic/local;Immobility;Multiple medical problems;Vascular compromise;Incontinence  Hydrotherapy Plan Debridement;Dressing change;Patient/family education;Pulsatile lavage with suction;Other (comment)  Wound Therapy - Frequency 6X / week  Wound Therapy - Follow Up Recommendations Skilled nursing facility  Wound Plan Continue as indicated above  Wound Therapy Goals - Improve the function of patient's integumentary system by progressing the wound(s) through the phases of wound healing by:  Decrease Necrotic Tissue to 20%  Decrease Necrotic Tissue - Progress Progressing toward goal  Increase Granulation Tissue to 50%  Increase Granulation Tissue - Progress Progressing toward goal  Goals/treatment plan/discharge plan were made with and agreed upon by patient/family Yes  Time For Goal Achievement 7 days  Wound Therapy - Potential for Goals Fair     Charlotte Crumb, PT DPT  [phone]

## 2012-07-18 NOTE — Procedures (Signed)
Interventional Radiology Procedure Note  Procedure: Successful placement of a right brachial vein dual lumen PowerPICC.  Catheter tip is at the superior cavoatrial junction and is ready for immediate use.  Of not, pt has a right subclavian vein stent which is patent.  The stent did require fluoro imaging to navigate the PICC through it. Complications: None Recommendations: - Routine line care - If pt ever requires another PICC, recommend LUE approach or return to IR for fluoroscopic guidance through right subclavian vein stent.  Signed,  Sterling Big, MD Vascular & Interventional Radiologist [facility] Radiology

## 2012-07-18 NOTE — Consult Note (Signed)
PULMONARY  / CRITICAL CARE MEDICINE  Name: [name] MRN: [id number] DOB: 10-Apr-1951    LOS: 6  REFERRING PROVIDER:  Dr. Butler Denmark  CHIEF COMPLAINT:  Loculated Pleural Effusion  BRIEF PATIENT DESCRIPTION: 61 y/o M, paraplegic, SNF resident, DNR admitted with cough, decreased mental status changes.  Noted to have UTI.  Found to have bilateral pleural effusions with left loculation extending into apices.  PCCM consulted for effusion evaluation.    LINES / TUBES: PIV>>>  CULTURES: 12/4 MRSA PCR>>>neg 12/4 BCx2>>> 12/4 UC>>>40k multiple morphotypes 12/8 Virus Panel>>>  ANTIBIOTICS: Zosyn 12/4>>>  SIGNIFICANT EVENTS:    LEVEL OF CARE:  SDU PRIMARY SERVICE:  TRH CONSULTANTS:  PCCM CODE STATUS:  DNR DIET:  Heart Healthy DVT Px:  Lovenox GI Px:    HISTORY OF PRESENT ILLNESS:  61 y/o M with PMH of CAD, CHF, GERD, Seizure disorder,  T7-8 paraplegic since the 1970's,  SNF resident, DNR admitted with cough, decreased mental status changes.  Known stage IV sacral ulceration with recent surgery on zyvox & Augmentin at SNF.  In ER was noted to have septic shock, poorly responsive, hypercarbic respiratory failure.   Placed on BiPap.  CXR demonstrated diffuse infiltrates / pneumonia. ECHO was assessed in setting of septic shock and demonstrated nml systolic function, EF 55-60%, large left pleural effusion with echo dense mass adherent to heart.  CT of Chest / ABD performed 12/8 to further evaluate for cancer given ECHO findings which demonstrated bilateral pleural effusions with left loculation extending into apices.  Endobronchial lesion noted R mainstem bronchus.   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  PCCM consulted for pleural effusion, endobronchial lesion evaluation.   PAST MEDICAL HISTORY :  Past Medical History  Diagnosis Date  . Coronary artery disease   . CHF (congestive heart failure)   . Paraplegia   . GERD (gastroesophageal reflux  disease)   . Seizure    History reviewed. No pertinent past surgical history. Prior to Admission medications   Medication Sig Start Date End Date Taking? Authorizing Provider  acetaminophen (TYLENOL) 325 MG tablet Take 325-650 mg by mouth every 6 (six) hours as needed. Take 325mg  once daily.  Take 650mg  every 6 hours as needed for pain.   Yes Historical Provider, MD  amLODipine (NORVASC) 10 MG tablet Take 10 mg by mouth daily.   Yes Historical Provider, MD  azithromycin (ZITHROMAX) 250 MG tablet Take 250-500 mg by mouth daily. Take 500mg  for 1 day, then take 250mg  every day for 4 days.  First dose 07/10/2012.   Yes Historical Provider, MD  baclofen (LIORESAL) 10 MG tablet Take 10 mg by mouth 4 (four) times daily.   Yes Historical Provider, MD  divalproex (DEPAKOTE) 250 MG DR tablet Take 250 mg by mouth daily. Take along with one Depakote 500mg  DR tablet to equal a dose of 750mg .   Yes Historical Provider, MD  divalproex (DEPAKOTE) 500 MG DR tablet Take 500-1,000 mg by mouth 2 (two) times daily. Take 500mg  along with 250mg  (to equal 750mg  dose) every morning and take 1,000mg  every night at bedtime.   Yes Historical Provider, MD  enoxaparin (LOVENOX) 40 MG/0.4ML injection Inject 40 mg into the skin daily.   Yes Historical Provider, MD  FeFum-FePoly-FA-B Cmp-C-Biot (INTEGRA PLUS PO) Take 1 capsule by mouth every morning.   Yes Historical Provider, MD  FLUoxetine (PROZAC) 20 MG capsule Take 60 mg by mouth 2 (two) times daily.   Yes Historical Provider,  MD  fluticasone (FLONASE) 50 MCG/ACT nasal spray Place 2 sprays into the nose at bedtime. Use for 1 month.  Final dose due 08/12/2012.   Yes Historical Provider, MD  guaiFENesin (MUCINEX) 600 MG 12 hr tablet Take 600 mg by mouth 2 (two) times daily. Take for 10 days.  Last dose due on the morning of 07/20/2012.   Yes Historical Provider, MD  hydrochlorothiazide (HYDRODIURIL) 25 MG tablet Take 25 mg by mouth daily.     Yes Historical Provider, MD   HYDROcodone-acetaminophen (NORCO) 10-325 MG per tablet Take 1 tablet by mouth every 4 (four) hours as needed. For pain    Yes Historical Provider, MD  iron polysaccharides (NIFEREX) 150 MG capsule Take 150 mg by mouth daily.   Yes Historical Provider, MD  lansoprazole (PREVACID) 30 MG capsule Take 30 mg by mouth 2 (two) times daily.    Yes Historical Provider, MD  latanoprost (XALATAN) 0.005 % ophthalmic solution Place 1 drop into both eyes at bedtime.   Yes Historical Provider, MD  levETIRAcetam (KEPPRA) 1000 MG tablet Take 1,000 mg by mouth 2 (two) times daily.     Yes Historical Provider, MD  lisinopril (PRINIVIL,ZESTRIL) 10 MG tablet Take 10 mg by mouth daily.   Yes Historical Provider, MD  oxyCODONE (OXY IR/ROXICODONE) 5 MG immediate release tablet Take 5 mg by mouth every 6 (six) hours as needed. For pain   Yes Historical Provider, MD  saccharomyces boulardii (FLORASTOR) 250 MG capsule Take 250 mg by mouth 2 (two) times daily. Take for 7 days.  Final dose due on the morning of 07/17/2012.   Yes Historical Provider, MD  tolterodine (DETROL) 2 MG tablet Take 2 mg by mouth at bedtime.   Yes Historical Provider, MD  zolpidem (AMBIEN) 5 MG tablet Take 5 mg by mouth at bedtime as needed. For sleep   Yes Historical Provider, MD   Allergies  Allergen Reactions  . Ciprofloxacin In D5w Itching  . Minocycline Hcl Itching    FAMILY HISTORY:  Family History  Problem Relation Age of Onset  . Heart failure Mother   . Heart failure Father    SOCIAL HISTORY:  reports that he has never smoked. He does not have any smokeless tobacco history on file. He reports that he does not drink alcohol or use illicit drugs.  REVIEW OF SYSTEMS:   Gen: Denies weight change, fatigue, night sweats.  Indicates subjective fevers, chills.   HEENT: Denies blurred vision, double vision, hearing loss, tinnitus, sinus congestion, rhinorrhea, sore throat, neck stiffness, dysphagia PULM: Denies shortness of breath, sputum  production, hemoptysis, wheezing.  Dry cough but no sputum production.   CV: Denies chest pain, edema, orthopnea, paroxysmal nocturnal dyspnea, palpitations GI: Denies abdominal pain, nausea, vomiting, diarrhea, hematochezia, melena, constipation, change in bowel habits GU: Denies dysuria, hematuria, polyuria, oliguria, urethral discharge Endocrine: Denies hot or cold intolerance, polyuria, polyphagia or appetite change Derm: Denies rash, dry skin, scaling or peeling skin change.  Known stage IV sacral ulceration.  "its getting better" Heme: Denies easy bruising, bleeding, bleeding gums Neuro: Denies headache, numbness, weakness, slurred speech, loss of memory or consciousness   INTERVAL HISTORY: lying in bed, no distress, on [place] O2  VITAL SIGNS: Temp:  [97.8 F (36.6 C)-99.8 F (37.7 C)] 98.8 F (37.1 C) (12/10 0731) Pulse Rate:  [86-103] 86  (12/10 0731) Resp:  [22-26] 22  (12/10 0731) BP: (110-124)/(48-58) 112/55 mmHg (12/10 0731) SpO2:  [92 %-100 %] 95 % (12/10 0731) FiO2 (%):  [  50 %] 50 % (12/10 0731)  PHYSICAL EXAMINATION: General:  Chronically ill, NAD Neuro:  AAOx4, speech clear, moves uppers, BLE chronic changes c/w paraplegia HEENT:  Mm pink/moist, no jvd Cardiovascular:  s1s2 rrr, no m/r/g Lungs:  resp's even/non-labored, lungs bilaterally with fine crackles  Abdomen:  Obese/soft, bsx4 active Musculoskeletal:  BLE chronic changes of paraplegia, no acute deformities Skin:  Warm/dry, few scattered abrasions on LE   Lab 07/17/12 0700 07/16/12 0515 07/14/12 0600  NA 142 143 143  K 3.4* 3.8 5.1  CL 87* 96 106  CO2 43* 37* 27  BUN 17 19 31*  CREATININE 0.77 0.70 0.76  GLUCOSE 80 103* 111*    Lab 07/16/12 0515 07/14/12 0600 07/13/12 0500  HGB 8.4* 8.7* 8.9*  HCT 29.1* 30.2* 29.9*  WBC 8.4 14.3* 16.5*  PLT 505* 606* 504*  all films reviewed.   ASSESSMENT / PLAN:  Acute Respiratory Failure Bilateral Pleural Effusions R Endobronchial Lesion Acute respiratory  failure in setting of bilateral pleural effusions, left loculated effusion, R endobronchial lesion.  Admitted with septic shock - urinary source with concern for pulmonary involvement.  Resolving septic shock but noted to have hypoxia and CXR concerning for PNA / effusions.  Fluid tracks up to apex on Left.     PLAN: -assessed at bedside but not enough fluid to tap with bedside ultrasound -diuresis as renal function / BP permit -consider CT guided thoracentesis as small window with bedside ultrasound -continue abx as ordered -PRN bipap for distress -wean O2 to keep sats > 92%  -f/u cxr   Sepsis UTI Urosepsis on admit.  Resolving.   PLAN: -abx as above -Per primary SVC  Canary Brim, NP-C Pulmonary and Critical Care Medicine [facility] Pager: [phone]   I have seen this pt above with NP Ollis and I have examined this patient and agree with above note. This pt cannot undergo bedside thoracentesis. If desired would need IR consultation.  I am not sure this is necessary  Caryl Bis  [phone]  Cell  [phone]  If no response or cell goes to voicemail, call beeper [phone]  07/18/2012, 12:08 PM

## 2012-07-18 NOTE — Progress Notes (Signed)
Pt eating breakfast. Looks comfortable.  Will try to check back or have PA check back during therapy.

## 2012-07-18 NOTE — Progress Notes (Signed)
[facility] PROGRESS NOTE  [name] ZOX:[id number] DOB: 04-02-1951 DOA: 07/12/2012 PCP: No primary provider on file.  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 Patient was alert and oriented and complaining of cough then later he was poorly responsive and lethargic and shaky. BP was 90/70.  Patient at baseline is alert and oriented and able to communicate well. He is immobile however is able to turn in bed. As per the nurse he was started on Zyvox and Augmentin 2 weeks back for possible infection over the coccyx and is still on it. He however did not have any fever. Patient was then transferred to [facility]. In the ED he was placed on BiPAP with a ABG showing hypercapnic respiratory failure. His blood pressure was 72/32 mm Hg - chest x-ray done showed hazy opacification of left hemithorax. Ppatient's sister Maureen Ralphs recommended to make him DO NOT RESUSCITATE and possible comfort care. Patient was given a dose of IV vancomycin and Zosyn in the ED along with IV fluids.  Assessment/Plan:  sepsis associated hypotension/dehydratrion  Due to infected sacral decub and possibly pneumonia - Hydration has significantly improved his BP   Acute respiratory failure due to:   A-- Pneumonia *Procalcitonin was only 0.21 which rules out a bacterial pneumonia - likely viral -  influenza panel was negative *cont Zosyn to cover his wound - d/c Levaquin on 12/7 - discontinue vancomycin today - see below     B-- Bilateral pleural effusions *Secondary to fluid overload and severe hypoalbuminemia resulting in a large pleural effusion (septated appearance  on CT of the chest) - doubt would benefit from therapeutic thoracentesis-have asked pulmonary medicine to reevaluate since left effusion is loculated, cytology and culture may be beneficial in resolving this process *Improved with diuretics - as tolerated weaning from BiPAP to nasal cannula oxygen *unable to measure I and O due to leaking urine *CT scan also shows small area and right mainstem which is subcentimeter and ?partial mucous plug vs/ bronchogenic CA  Altered mental status/ toxic encephalopathy *Resolved - now alert and oriented  Infected Decubitus ulcer of coccygeal region *Has had chronic decubitus ulcers with infections - (per prior notes and numerous CTs) *ESR and CRP elevated - therefore, likely has osteo especially in setting of any exposed bone *ID consulted - MRSA screening negative so discontinue vancomycin *wound care consulted *surgery consulted for further debridement - per their assessments no indication to pursue surgical debridement at this time *Hydrotherapy and Santyl dressing ordered by consultants  H/O CHF *Appears to be diastolic dysfunction per echo in 2006 - no mention of diastolic dysfunction on repeat ECHO his hospitalization.  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 Of UTI *Multiple bacterial morphotypes on culture but likely contaminants   Anemia *On PO Niferex as outpt -  continue  Hypertonic bladder *Cont Detrol -still leaking around suprapubic catheter despite largest catheter available inserted into tract  Paraplegia due to injury in 1970s  Colostomy  Seizure disorder Cont Keppra and Depakote  Code Status: DNR Family Communication: palliative care in contact with family Disposition Plan: cont to follow in SDU DVT prophylaxis: Lovenox  Consultants:  Surgery  Infectious disease telephone consultation  Procedures:  none  Antibiotics:  Vanc- Zosyn- Levaquin - started 12/4  Levaquin stopped on 12/7  Vancomycin stopped 12/9  HPI/Subjective: Alert and endorses improved respiratory pattern and primarily complaining of buttock/sacral wound pain. Denies chest pain or shortness of breath.   Objective: Filed Vitals:   07/18/12 0000 07/18/12 0552 07/18/12 0731 07/18/12 1215  BP: 120/50 119/58 112/55 116/56  Pulse:   86 90  Temp: 99.8 F (37.7 C) 98.9 F (37.2 C) 98.8 F (37.1 C) 98.7 F (37.1 C)  TempSrc: Oral Oral Oral Oral  Resp:   22 20  Height:      Weight:      SpO2:   95% 92%    Intake/Output Summary (Last 24 hours) at 07/18/12 1230 Last data filed at 07/18/12 1219  Gross per 24 hour  Intake    604 ml  Output    175 ml  Net    429 ml    Exam:   General:  Alert, no distress  Cardiovascular: RRR, no murmurs  Respiratory: very decreased breath sounds prominent crackles left mid field lungs anterior- pulse ox in 92% on nasal cannula oxygen  Abdomen: soft, NT, ND, colostomy intact- suprapubic cath leaking from around the site- no urine going into collecting bag.   Ext: deformities in feet- legs immobile  Skin- large ulcer on sacral area- 2 small holes noted over right ischium.   Data Reviewed: Basic Metabolic Panel:  Lab 07/17/12 1191 07/16/12 0515 07/14/12 0600 07/13/12 0500 07/12/12 1335  NA 142 143 143 137 139  K 3.4* 3.8 5.1 5.4* 5.7*  CL 87* 96 106 102 100  CO2 43* 37* 27 24 29   GLUCOSE 80 103* 111* 95  112*  BUN 17 19 31* 34* 37*  CREATININE 0.77 0.70 0.76 1.15 1.47*  CALCIUM 9.2 9.2 8.7 8.1* 8.3*  MG -- -- -- -- --  PHOS -- -- -- -- --   Liver Function Tests:  Lab 07/13/12 0500 07/12/12 1335 07/12/12 1209  AST 21 17 80*  ALT 25 29 NOT DONE  ALKPHOS 137* 152* NOT DONE  BILITOT 0.2* 0.2* 0.3  PROT 7.1 7.3 8.2  ALBUMIN 1.8* 2.1* 2.2*   CBC:  Lab 07/16/12 0515 07/14/12 0600 07/13/12 0500 07/12/12 1209  WBC 8.4 14.3* 16.5* 15.4*  NEUTROABS -- -- -- 12.7*  HGB 8.4* 8.7* 8.9* 10.0*  HCT 29.1* 30.2* 29.9* 33.1*  MCV 83.6 85.8 82.8 80.5  PLT 505* 606* 504* 541*   BNP (last 3 results)  Basename 07/12/12 1320  PROBNP 3087.0*    Recent Results (from the past 240 hour(s))  CULTURE, BLOOD (ROUTINE X 2)     Status: Normal (Preliminary result)   Collection Time   07/12/12 12:20 PM      Component Value Range Status Comment   Specimen Description BLOOD ARM RIGHT   Final    Special Requests BOTTLES DRAWN AEROBIC ONLY   Final  Culture  Setup Time 07/12/2012 21:55   Final    Culture     Final    Value:        BLOOD CULTURE RECEIVED NO GROWTH TO DATE CULTURE WILL BE HELD FOR 5 DAYS BEFORE ISSUING A FINAL NEGATIVE REPORT   Report Status PENDING   Incomplete   CULTURE, BLOOD (ROUTINE X 2)     Status: Normal (Preliminary result)   Collection Time   07/12/12 12:44 PM      Component Value Range Status Comment   Specimen Description BLOOD HAND RIGHT   Final    Special Requests BOTTLES DRAWN AEROBIC ONLY 2CC   Final    Culture  Setup Time 07/12/2012 16:52   Final    Culture     Final    Value:        BLOOD CULTURE RECEIVED NO GROWTH TO DATE CULTURE WILL BE HELD FOR 5 DAYS BEFORE ISSUING A FINAL NEGATIVE REPORT   Report Status PENDING   Incomplete   URINE CULTURE     Status: Normal   Collection Time   07/12/12  2:10 PM      Component Value Range Status Comment   Specimen Description URINE, CLEAN CATCH   Final    Special Requests NONE   Final    Culture  Setup Time 07/12/2012 15:02    Final    Colony Count 40,000 COLONIES/ML   Final    Culture     Final    Value: Multiple bacterial morphotypes present, none predominant. Suggest appropriate recollection if clinically indicated.   Report Status 07/13/2012 FINAL   Final   MRSA PCR SCREENING     Status: Normal   Collection Time   07/12/12  5:22 PM      Component Value Range Status Comment   MRSA by PCR NEGATIVE  NEGATIVE Final     Studies: Reviewed by MD  Scheduled Meds: Reviewed by MD _____________________________________________________________________  Junious Silk, ANP [facility] Pager [phone] If 8PM-8AM, please contact night-coverage at [email or web address], password [facility] 07/18/2012, 12:30 PM  LOS: 6 days     I have examined the patient and reviewed the chart. I agree with the above note which I have modified.   Calvert Cantor, MD

## 2012-07-18 NOTE — Consult Note (Addendum)
Wound care follow-up:  CCS following for assessment of sacrum and hip wounds.  Physical therapy performing hydrotherapy, sharp debridement, and pt receiving Santyl for chemical debridement.  On air mattress to reduce pressure.  ID has been following for plan of care. Amt of non-viable tissue to sacrum and hip wounds is decreasing with treatment by physical therapy. Left hip has 2 areas that tunnel to bone. . Refer to PT notes for percentages and measurements.  Pt can resume follow-up with plastics team in [facility] after discharge, since they were following for plan of care prior to admission.  Suprapubic tube still leaking urine and it is difficult to keep dressings to wounds clean and dry.  Mod green drainage with odor when dressing changes performed.  Optimal plan of care would be to transfer to a SNF which could continue hydrotherapy after discharge. Continue present plan of care.   Cammie Mcgee, RN, MSN, [employer]  [phone]

## 2012-07-18 NOTE — Progress Notes (Signed)
Palliative Medicine Team SW Met with pt's sisters Maureen Ralphs and Bonita Quin this am while pt was in for x-ray. Later spoke to pt, c/o "still not feeling well", but down to nasal cannula. Both pt and family pleased that pt no longer in immediate respiratory distress. Pt remembers our GOC conversation, continues to desire aggressive tx with intention of ultimately returning to SNF if possible. Present for discussions regarding pt's likely need and appropriateness for LTACH prior to return to SNF with CSW, RN/CM, PMT DO and [facility]. Discussed with family the benefit of discussing advance directives should/when pt declines in the future. Family very appreciative of support.   Kennieth Francois, [place] Pager [phone]

## 2012-07-18 NOTE — Progress Notes (Signed)
Palliative Care Team at [facility] Progress Note   SUBJECTIVE: Patient improved. Sleeping this afternoon with covers over his head, but according to family he is back to himself and joking with them.  Interval Events: 61 yo gentleman who has been a paraplegic since 1974 who lived independently until 2006 when his health deteriorated to the point where he needed skilled nursing care related to chronic non-healing decubitus ulcers and generalized debility. Up until that point he had an excellent QOL he could transfer independently, loved being outside in his garden and was very involved with the care of his mother who had brain cancer until her death in 70. His sister described him as being exceptionally strong physically and stubborn, but also sensitive and fearful about issues related to his poor health, debility and mortality. He was admitted to the hospital in septic shock from a severe PNA and has been persistently hypoxic, a decision was made to make him a DNR by his sister and CCM when he was unresponsive. PMT goals meeting on 12/6 confirmed that patient is agreeable to DNR but also wants full scope medical treatment. He was not ready for full comfort care or Hospice at teh time of our meeting. He decided that if he ever got to point where he could not make decisions for himself that his sisters could elect for comfort care and Hospice at that point.  OBJECTIVE: Vital Signs: BP 106/45  Pulse 92  Temp 98.7 F (37.1 C) (Oral)  Resp 19  Ht 6' 0.84" (1.85 m)  Wt 102 kg (224 lb 13.9 oz)  BMI 29.80 kg/m2  SpO2 95%   Intake and Output: 12/09 0701 - 12/10 0700 In: 633.5 [P.O.:480; IV Piggyback:153.5] Out: 0   Physical Exam: General: Sleeping, chronically ill appearing  Head: Normocephalic, atraumatic.  Lungs:  Decreased BS both bases  Heart: Tachy. S1 and S2 normal without gallop,  or rubs. (+) murmur  Abdomen:  BS normoactive. Soft, Nondistended, non-tender.  No masses or  organomegaly.  Extremities: 1+ LE edema and contractures    Allergies  Allergen Reactions  . Ciprofloxacin In D5w Itching  . Minocycline Hcl Itching    Medications: Scheduled Meds:     . antiseptic oral rinse  15 mL Mouth Rinse q12n4p  . chlorhexidine  15 mL Mouth Rinse BID  . collagenase   Topical Daily  . divalproex  750 mg Oral Daily  . enoxaparin (LOVENOX) injection  40 mg Subcutaneous Q24H  . feeding supplement  237 mL Oral BID BM  . furosemide  80 mg Intravenous Q6H  . latanoprost  1 drop Both Eyes QHS  . levETIRAcetam  1,000 mg Oral BID  . oxybutynin  5 mg Oral TID  . piperacillin-tazobactam (ZOSYN)  IV  3.375 g Intravenous Q8H  . saccharomyces boulardii  250 mg Oral BID  . sodium chloride  10-40 mL Intracatheter Q12H   PRN Meds: acetaminophen, [COMPLETED] iohexol, morphine injection, oxyCODONE, sodium chloride  Labs: CBC    Component Value Date/Time   WBC 8.4 07/16/2012 0515   RBC 3.48* 07/16/2012 0515   HGB 8.4* 07/16/2012 0515   HCT 29.1* 07/16/2012 0515   PLT 505* 07/16/2012 0515   MCV 83.6 07/16/2012 0515   MCH 24.1* 07/16/2012 0515   MCHC 28.9* 07/16/2012 0515   RDW 17.1* 07/16/2012 0515   LYMPHSABS 1.4 07/12/2012 1209   MONOABS 1.3* 07/12/2012 1209   EOSABS 0.0 07/12/2012 1209   BASOSABS 0.0 07/12/2012 1209    CMET  Component Value Date/Time   NA 142 07/17/2012 0700   K 3.4* 07/17/2012 0700   CL 87* 07/17/2012 0700   CO2 43* 07/17/2012 0700   GLUCOSE 80 07/17/2012 0700   BUN 17 07/17/2012 0700   CREATININE 0.77 07/17/2012 0700   CALCIUM 9.2 07/17/2012 0700   PROT 7.1 07/13/2012 0500   ALBUMIN 1.8* 07/13/2012 0500   AST 21 07/13/2012 0500   ALT 25 07/13/2012 0500   ALKPHOS 137* 07/13/2012 0500   BILITOT 0.2* 07/13/2012 0500   GFRNONAA >90 07/17/2012 0700   GFRAA >90 07/17/2012 0700    ASSESSMENT/ PLAN:  Zohar is chronically, critically ill with multiple end stage medical problems including a stage 4 decubitus and respiratory failure with pleural effusion.  He has been improving from his sepsis and not requiring BiPap as frequently for dyspnea. [facility]  and patient and family agreeable to this as an intermediate step with plan for back to SNF when ready. This QOL is acceptable for patient. Patient and family aware of comfort care options should he decline again or face another serious illness. No symptom management needs currently.        Total Overall Time: 20 minutes   Greater than 50%  of this time was spent counseling and coordinating care related to the above assessment and plan.   Edsel Petrin, DO  07/18/2012, 10:05 PM  Please contact Palliative Medicine Team phone at [phone] for questions and concerns.

## 2012-07-19 ENCOUNTER — Other Ambulatory Visit (HOSPITAL_COMMUNITY): Payer: Self-pay

## 2012-07-19 ENCOUNTER — Inpatient Hospital Stay
Admission: AD | Admit: 2012-07-19 | Discharge: 2012-08-08 | Disposition: A | Discharge status: Skilled Nursing Facility | Payer: PRIVATE HEALTH INSURANCE | Source: Ambulatory Visit | Attending: Internal Medicine | Admitting: Internal Medicine

## 2012-07-19 DIAGNOSIS — J9600 Acute respiratory failure, unspecified whether with hypoxia or hypercapnia: Secondary | ICD-10-CM

## 2012-07-19 DIAGNOSIS — I959 Hypotension, unspecified: Secondary | ICD-10-CM

## 2012-07-19 DIAGNOSIS — L89159 Pressure ulcer of sacral region, unspecified stage: Secondary | ICD-10-CM

## 2012-07-19 DIAGNOSIS — R4182 Altered mental status, unspecified: Secondary | ICD-10-CM

## 2012-07-19 MED ORDER — FUROSEMIDE 20 MG PO TABS: 60.0000 mg | ORAL_TABLET | Freq: Two times a day (BID) | ORAL | Status: DC

## 2012-07-19 MED ORDER — SODIUM CHLORIDE 0.9 % IJ SOLN: 10.0000 mL | Freq: Two times a day (BID) | INTRAMUSCULAR | Status: DC

## 2012-07-19 MED ORDER — OXYCODONE HCL 5 MG PO TABS: 5.0000 mg | ORAL_TABLET | ORAL | Status: DC | PRN

## 2012-07-19 MED ORDER — LATANOPROST 0.005 % OP SOLN: 1.0000 [drp] | Freq: Every day | OPHTHALMIC | Status: DC

## 2012-07-19 MED ORDER — LEVETIRACETAM 1000 MG PO TABS: 1000.0000 mg | ORAL_TABLET | Freq: Two times a day (BID) | ORAL | Status: DC

## 2012-07-19 MED ORDER — ACETAMINOPHEN 325 MG PO TABS: 650.0000 mg | ORAL_TABLET | Freq: Four times a day (QID) | ORAL | Status: DC | PRN

## 2012-07-19 MED ORDER — SACCHAROMYCES BOULARDII 250 MG PO CAPS: 250.0000 mg | ORAL_CAPSULE | Freq: Two times a day (BID) | ORAL | Status: DC

## 2012-07-19 MED ORDER — BIOTENE DRY MOUTH MT LIQD: 15.0000 mL | Freq: Two times a day (BID) | OROMUCOSAL | Status: DC

## 2012-07-19 MED ORDER — CHLORHEXIDINE GLUCONATE 0.12 % MT SOLN: 15.0000 mL | Freq: Two times a day (BID) | OROMUCOSAL | Status: DC

## 2012-07-19 MED ORDER — COLLAGENASE 250 UNIT/GM EX OINT: TOPICAL_OINTMENT | Freq: Every day | CUTANEOUS | Status: DC

## 2012-07-19 MED ORDER — DIVALPROEX SODIUM 250 MG PO DR TAB: 750.0000 mg | DELAYED_RELEASE_TABLET | Freq: Every day | ORAL | Status: DC

## 2012-07-19 MED ORDER — ENOXAPARIN SODIUM 40 MG/0.4ML ~~LOC~~ SOLN: 40.0000 mg | SUBCUTANEOUS | Status: DC

## 2012-07-19 MED ORDER — FUROSEMIDE 40 MG PO TABS
60.0000 mg | ORAL_TABLET | Freq: Two times a day (BID) | ORAL | Status: DC
  Administered 2012-07-19: 60 mg via ORAL
  Filled 2012-07-19 (×3): qty 1

## 2012-07-19 MED ORDER — ENSURE COMPLETE PO LIQD: 237.0000 mL | Freq: Two times a day (BID) | ORAL | Status: DC

## 2012-07-19 MED ORDER — OXYBUTYNIN CHLORIDE 5 MG PO TABS: 5.0000 mg | ORAL_TABLET | Freq: Three times a day (TID) | ORAL | Status: DC

## 2012-07-19 MED ORDER — SODIUM CHLORIDE 0.9 % IJ SOLN: 10.0000 mL | INTRAMUSCULAR | Status: DC | PRN

## 2012-07-19 MED ORDER — MORPHINE SULFATE 2 MG/ML IJ SOLN: 1.0000 mg | INTRAMUSCULAR | Status: DC | PRN

## 2012-07-19 NOTE — Discharge Summary (Signed)
Physician Discharge Summary  [name] JYN:[id number] DOB: 01-06-1951 DOA: 07/12/2012  PCP: MD at SNF   Admit date: 07/12/2012 Discharge date: 07/19/2012  Time spent: 35 minutes  Recommendations for Outpatient Follow-up:  1. 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   Discharge Condition: Stable for transfer to LTAC facility  Diet recommendation: Heart healthy  Filed Weights   07/12/12 2352 07/17/12 0300 07/18/12 2355  Weight: 102 kg (224 lb 13.9 oz) 102 kg (224 lb 13.9 oz) 96.5 kg (212 lb 11.9 oz)    History of present illness:  61 year old male with history of T7-T8 paraplegia secondary 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 Patient was alert and oriented and complaining of cough then later he was poorly responsive and lethargic and shaky. BP was 90/70. Patient at baseline was alert and oriented and able to communicate well. He is immobile however is able to turn in bed. As per the nurse he was started on Zyvox and Augmentin 2 weeks back for possible infection over the coccyx and is still on it. He however did not  have any fever. Patient was then transferred to [facility]. In the ED he was placed on BiPAP with an ABG showing hypercapnic respiratory failure. His blood pressure was 72/32 mm Hg - chest x-ray done showed hazy opacification of left hemithorax. Patient's sister [name] recommended to make him DO NOT RESUSCITATE and possible comfort care. Patient was given a dose of IV vancomycin and Zosyn in the ED along with IV fluids.  Hospital Course:   Sepsis associated hypotension/dehydratrion  Felt to be primarily related to infected sacral decubitus and underlying pneumonia. Hypotension resolved with treatment of underlying conditions and adequate rehydration. Early during the hospitalization patient's status was quite tenuous and he was evaluated by Palliative Care Medicine. Patient was satisfied with his current quality of life and plans were to continue aggressive medical management with any acute problems with long-term goal to transfer back to skilled nursing facility when medically stable. Patient and family verbalized awareness of comfort care option should he decline again or face another serious issue.  Acute respiratory failure due to: A-- Pneumonia Procalcitonin was only 0.21 which ruled against bacterial pneumonia. Suspected underlying viral etiology - influenza panel was negative.  B-- Bilateral pleural effusions Primary etiology to patient's respiratory failure. Mediated by volume overload in setting of severe hypoalbuminemia with resultant pleural effusions greater on the left. CT of the chest confirmed septated appearance to the effusion. Pulmonary medicine consulted regarding opinion if thoracentesis would be beneficial. Since patient has been improving markedly with his respiratory failure and the effusions were small it was felt that no further intervention would be required for effusions.  Continued attempts at diuresis are ongoing. Lasix dosage has been decreased to 60 mg by mouth twice a day  and  likely can be decreased to daily dosing in the next 24 hours. Patient did require BiPAP for a period of time but has now tolerated weaning to nasal cannula oxygen. In addition to the effusions CT of the chest also demonstrated a very tiny subcentimeter area in the right mainstem bronchus.  Altered mental status/ toxic encephalopathy Felt to be related to hypotension and sepsis. Now resolved  Infected Decubitus ulcer of coccygeal region Has had issues with chronic recurrent decubitus ulcers. ESR and CRP were elevated and it is suspected he has osteomyelitis in setting of exposed bone. Infectious Disease service was consulted. Patient has completed a course of Zosyn and Levaquin. Vancomycin had also been initiated but with MRSA screening negative this medication was discontinued. In addition Gen Surgery was consulted and recommended initiation of aggressive hydrotherapy treatments which will continue after transfer to long-term acute care. Wound care RN also assisted with patient's wound management.  History of grade 1 diastolic dysfunction Previous echo in 2006 mentioned diastolic dysfunction. Echo was repeated this admission and showed no evidence of diastolic dysfunction and preserved EF.   Subxiphoid Mass Echocardiogram questioned a mass adherent to the heart. CT of the chest done this admission demonstrated a stable xiphoid mass that was known in 2010. No further evaluation planned at this time.  Acute renal failure with hyperkalemia Secondary to hypotension, dehydration and sepsis. Resolved  Anemia Continuing home Niferex.  Hypertonic bladder Has had issues with chronic leaking around suprapubic catheter despite largest catheter available inserted into tract. Continue Detrol.  Seizure disorder Has been stable on home Keppra and Depakote without any re-emergence of seizures this admission.  Procedures: PICC line placed due to difficult IV access  Consultations:  Infectious  Disease service  Pulmonary Critical Care Medicine  Palliative Care Medicine  Gen. Surgery  Discharge Exam: Filed Vitals:   07/18/12 2019 07/18/12 2355 07/19/12 0355 07/19/12 0718  BP: 106/45 117/52 103/55 126/45  Pulse: 92 86 80 82  Temp: 98.7 F (37.1 C) 98.6 F (37 C) 98.2 F (36.8 C) 97.5 F (36.4 C)  TempSrc: Oral Oral Oral Oral  Resp: 19 21 21 21   Height:      Weight:  96.5 kg (212 lb 11.9 oz)    SpO2: 95% 97% 99% 98%   Exam:  General: Alert, no distress  Cardiovascular: RRR, no murmurs  Respiratory: very decreased breath sounds prominent crackles left mid field lungs anterior- pulse ox 92% on nasal cannula oxygen  Abdomen: soft, NT, ND, colostomy intact- suprapubic cath leaking slightly from around the site  Ext: deformities in feet- legs immobile  Skin- large ulcer on sacral area- 2 small holes noted over right ischium.    Discharge Instructions Discharge to [facility] with plans to continue current treatments as was receiving in the acute care setting including aggressive hydrotherapy treatments to sacral coccygeal decubitus.  It is anticipated that further titration of his diuretic will be required, based upon size of his pleural effusions, total body volume, and BP/renal function tolerance.    Medication List     As of 07/19/2012 11:05 AM    TAKE these medications         acetaminophen 325 MG tablet   Commonly known as: TYLENOL   Take 2 tablets (650 mg total) by mouth every 6 (six) hours as needed.      antiseptic oral rinse Liqd   15 mLs by Mouth Rinse route 2 times daily at 12 noon and 4 pm.  chlorhexidine 0.12 % solution   Commonly known as: PERIDEX   Use as directed 15 mLs in the mouth or throat 2 (two) times daily.      collagenase ointment   Commonly known as: SANTYL   Apply topically daily.      divalproex 250 MG DR tablet   Commonly known as: DEPAKOTE   Take 3 tablets (750 mg total) by mouth daily.      enoxaparin 40  MG/0.4ML injection   Commonly known as: LOVENOX   Inject 0.4 mLs (40 mg total) into the skin daily.      feeding supplement Liqd   Take 237 mLs by mouth 2 (two) times daily between meals.      FLUoxetine 20 MG capsule   Commonly known as: PROZAC   Take 60 mg by mouth 2 (two) times daily.      fluticasone 50 MCG/ACT nasal spray   Commonly known as: FLONASE   Place 2 sprays into the nose at bedtime. Use for 1 month.  Final dose due 08/12/2012.      furosemide 20 MG tablet   Commonly known as: LASIX   Take 3 tablets (60 mg total) by mouth 2 (two) times daily.      guaiFENesin 600 MG 12 hr tablet   Commonly known as: MUCINEX   Take 600 mg by mouth 2 (two) times daily. Take for 10 days.  Last dose due on the morning of 07/20/2012.      iron polysaccharides 150 MG capsule   Commonly known as: NIFEREX   Take 150 mg by mouth daily.      latanoprost 0.005 % ophthalmic solution   Commonly known as: XALATAN   Place 1 drop into both eyes at bedtime.      levETIRAcetam 1000 MG tablet   Commonly known as: KEPPRA   Take 1 tablet (1,000 mg total) by mouth 2 (two) times daily.      morphine 2 MG/ML injection   Inject 0.5-1 mLs (1-2 mg total) into the vein every 4 (four) hours as needed.      oxybutynin 5 MG tablet   Commonly known as: DITROPAN   Take 1 tablet (5 mg total) by mouth 3 (three) times daily.      oxyCODONE 5 MG immediate release tablet   Commonly known as: Oxy IR/ROXICODONE   Take 1-2 tablets (5-10 mg total) by mouth every 4 (four) hours as needed.      saccharomyces boulardii 250 MG capsule   Commonly known as: FLORASTOR   Take 1 capsule (250 mg total) by mouth 2 (two) times daily.      sodium chloride 0.9 % injection   10-40 mLs by Intracatheter route every 12 (twelve) hours.      sodium chloride 0.9 % injection   10-40 mLs by Intracatheter route as needed (flush).        The results of significant diagnostics from this hospitalization (including imaging,  microbiology, ancillary and laboratory) are listed below for reference.    Significant Diagnostic Studies: Ct Head Wo Contrast  07/12/2012  *RADIOLOGY REPORT*  Clinical Data:  Altered mental status  CT HEAD WITHOUT CONTRAST  Technique:  Contiguous axial images were obtained from the base of the skull through the vertex without contrast. Study was obtained within 24 hours of patient arrival at the emergency department.  Comparison: None.  MPRESSION: Mild atrophy.  Study otherwise unremarkable.   Original Report Authenticated By: Bretta Bang, M.D.    Ct  Chest W Contrast  07/16/2012  *RADIOLOGY REPORT*  Clinical Data:  Respiratory distress.  Mass seen on echo.  Cancer workup.  CT CHEST, ABDOMEN AND PELVIS WITH CONTRAST  Technique:  Multidetector CT imaging of the chest, abdomen and pelvis was performed following the standard protocol during bolus administration of intravenous contrast.  Contrast: OMNIPAQUE IOHEXOL 300 MG/ML  SOLN  Comparison:  CT abdomen and pelvis 10/08/2008.  CT CHEST  MPRESSION: Bilateral pleural effusions with lower lobe atelectasis.  Left pleural effusions suggest loculation with pleural thickening. Pleural mass not excluded.  Nonspecific lymph nodes throughout the mediastinum may be reactive or early metastatic.  Endobronchial appearing lesion in the right mainstem bronchus.  Extensive degenerative changes in the spine.  CT ABDOMEN AND PELVIS  MPRESSION: Heterogeneous appearing sub xyphoid mass is enlarging since 2010 suggesting possible low grade malignancy.  A abnormal-appearing lower pole of left kidney and left renal pelvis may suggest inflammatory process or transitional cell neoplasm.  This is similar to previous study.  Bladder wall thickening likely due to hypertrophic cystitis or urinary tract infection.  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   Original Report Authenticated By: Burman Nieves, M.D.    Ir Fluoro Guide Cv Line Right  07/18/2012  *RADIOLOGY REPORT*  PICC PLACEMENT WITH ULTRASOUND AND FLUOROSCOPIC  GUIDANCE  IMPRESSION:  1.  Successful placement of a right arm PICC with sonographic and fluoroscopic guidance.  The catheter is ready for use.  2.  Incidentally, the patient has a patent right subclavian venous stent which is the likely etiology of the difficulty in passing the PICC experienced by the IV team.  If the patient were to require a PICC in the future, consider using left arm approach.  Signed,  Sterling Big, MD Vascular & Interventional Radiologist [facility]   Original Report Authenticated By: Malachy Moan, M.D.    Microbiology: Recent Results (from the past 240 hour(s))  CULTURE, BLOOD (ROUTINE X 2)     Status: Normal   Collection Time   07/12/12 12:20 PM      Component Value Range Status Comment   Specimen Description BLOOD ARM RIGHT   Final    Special Requests BOTTLES DRAWN AEROBIC ONLY   Final    Culture  Setup Time 07/12/2012 21:55   Final    Culture     Final    Value:        BLOOD CULTURE RECEIVED NO GROWTH TO DATE CULTURE WILL BE HELD FOR 5 DAYS BEFORE ISSUING A FINAL NEGATIVE REPORT   Report Status 07/18/2012 FINAL   Final   CULTURE, BLOOD (ROUTINE X 2)     Status: Normal   Collection Time   07/12/12 12:44 PM      Component Value Range Status Comment   Specimen Description BLOOD HAND RIGHT   Final    Special Requests BOTTLES DRAWN AEROBIC ONLY 2CC   Final    Culture  Setup Time 07/12/2012 16:52   Final    Culture     Final    Value:        BLOOD CULTURE RECEIVED NO GROWTH TO DATE CULTURE WILL BE HELD FOR 5 DAYS BEFORE ISSUING A FINAL NEGATIVE REPORT   Report Status 07/18/2012 FINAL   Final   URINE CULTURE     Status: Normal   Collection Time   07/12/12  2:10 PM      Component Value  Range Status Comment   Specimen Description URINE, CLEAN CATCH   Final    Special Requests NONE   Final    Culture  Setup Time  07/12/2012 15:02   Final    Colony Count 40,000 COLONIES/ML   Final    Culture     Final    Value: Multiple bacterial morphotypes present, none predominant. Suggest appropriate recollection if clinically indicated.   Report Status 07/13/2012 FINAL   Final   MRSA PCR SCREENING     Status: Normal   Collection Time   07/12/12  5:22 PM      Component Value Range Status Comment   MRSA by PCR NEGATIVE  NEGATIVE Final      Labs: Basic Metabolic Panel:  Lab 07/17/12 1478 07/16/12 0515 07/14/12 0600 07/13/12 0500 07/12/12 1335  NA 142 143 143 137 139  K 3.4* 3.8 5.1 5.4* 5.7*  CL 87* 96 106 102 100  CO2 43* 37* 27 24 29   GLUCOSE 80 103* 111* 95 112*  BUN 17 19 31* 34* 37*  CREATININE 0.77 0.70 0.76 1.15 1.47*  CALCIUM 9.2 9.2 8.7 8.1* 8.3*  MG -- -- -- -- --  PHOS -- -- -- -- --   Liver Function Tests:  Lab 07/13/12 0500 07/12/12 1335 07/12/12 1209  AST 21 17 80*  ALT 25 29 NOT DONE  ALKPHOS 137* 152* NOT DONE  BILITOT 0.2* 0.2* 0.3  PROT 7.1 7.3 8.2  ALBUMIN 1.8* 2.1* 2.2*   CBC:  Lab 07/16/12 0515 07/14/12 0600 07/13/12 0500 07/12/12 1209  WBC 8.4 14.3* 16.5* 15.4*  NEUTROABS -- -- -- 12.7*  HGB 8.4* 8.7* 8.9* 10.0*  HCT 29.1* 30.2* 29.9* 33.1*  MCV 83.6 85.8 82.8 80.5  PLT 505* 606* 504* 541*   BNP: BNP (last 3 results)  Basename 07/12/12 1320  PROBNP 3087.0*  Signed:  ELLIS,ALLISON L.ANP  [facility] 07/19/2012, 11:05 AM  I have personally examined this patient and reviewed the entire database. I have reviewed the above note, made any necessary editorial changes, and agree with its content.  Lonia Blood, MD [facility]

## 2012-07-19 NOTE — H&P (Signed)
Report called to [facility] on unit 5700. Patient to be transferred to bed 5729.

## 2012-07-19 NOTE — Progress Notes (Signed)
Pt to dc to [facility].  CSW to sign off please re consult if needed.   Angelia Mould, MSW, [place] [phone]

## 2012-07-19 NOTE — Progress Notes (Signed)
PT HYDROTHERAPY PROGRESS NOTE     07/19/12 1200  Subjective Assessment  Subjective Pt pleasent and cooperative this morning. Agreeable to hydrotherapy  Patient and Family Stated Goals heal wounds  Date of Onset 07/14/12  Evaluation and Treatment  Evaluation and Treatment Procedures Explained to Patient/Family Yes  Evaluation and Treatment Procedures agreed to  Pressure Ulcer 07/14/12 Stage III -  Full thickness tissue loss. Subcutaneous fat may be visible but bone, tendon or muscle are NOT exposed. Sacral wound (bilaterall buttock region wound 1)  Date First Assessed/Time First Assessed: 07/14/12 1000   Location: Sacrum  Staging: Stage III -  Full thickness tissue loss. Subcutaneous fat may be visible but bone, tendon or muscle are NOT exposed.  Wound Description (Comments): Sacral wound (bilatera  State of Healing Eschar  Site / Wound Assessment Bleeding;Black;Pale;Pink;Red;Yellow  % Wound base Red or Granulating 50%  % Wound base Yellow 30%  % Wound base Black 20%  Peri-wound Assessment Purple;Pink;Erythema (blanchable)  Tunneling (cm) 8 (from tunnel on L buttock to large open wound on L cheek)  Undermining (cm) 5 (circumferentially around newly evolving oven L cheek wound)  Drainage Amount Moderate  Drainage Description Serosanguineous;Serous;No odor  Treatment Hydrotherapy (Pulse lavage);Debridement (Selective);Packing (Saline gauze);Other (Comment);Tape changed (santyl)  Dressing Type ABD;Barrier Film (skin prep);Moist to dry;Tape dressing  Dressing Clean;Dry;Intact  Pressure Ulcer 07/14/12 Stage III -  Full thickness tissue loss. Subcutaneous fat may be visible but bone, tendon or muscle are NOT exposed. Sacral wound (proximal wound 2)  Date First Assessed/Time First Assessed: 07/14/12 1000   Location: Sacrum  Staging: Stage III -  Full thickness tissue loss. Subcutaneous fat may be visible but bone, tendon or muscle are NOT exposed.  Wound Description (Comments): Sacral wound  (proximal  State of Healing Eschar  Site / Wound Assessment Bleeding;Black;Red;Yellow;Pink;Pale  % Wound base Red or Granulating 45%  % Wound base Yellow 40%  % Wound base Black 15%  Peri-wound Assessment Purple;Pink;Erythema (blanchable)  Drainage Amount Minimal  Drainage Description Serous;Serosanguineous  Treatment Debridement (Selective);Hydrotherapy (Pulse lavage);Packing (Saline gauze);Other (Comment);Tape changed  Dressing Type ABD;Barrier Film (skin prep);Moist to dry;Tape dressing  Dressing Clean;Dry;Intact  Pressure Ulcer 07/14/12 Stage III -  Full thickness tissue loss. Subcutaneous fat may be visible but bone, tendon or muscle are NOT exposed. Tunneling to sacrum (near midline of left buttock cheek)  Date First Assessed/Time First Assessed: 07/14/12 1000   Location: Buttocks  Staging: Stage III -  Full thickness tissue loss. Subcutaneous fat may be visible but bone, tendon or muscle are NOT exposed.  Wound Description (Comments): Tunneling to sacrum   State of Healing Other (Comment) (full tunneling wound)  Site / Wound Assessment Clean;Dry  % Wound base Other (Comment) 100%  Peri-wound Assessment Erythema (blanchable)  Margins Epibole (rolled edges)  Drainage Amount Minimal  Drainage Description Serous  Dressing Type Gauze (Comment)  Dressing Clean;Dry;Intact  Hydrotherapy  Pulsed Lavage with Suction (psi) 4 psi  Pulsed Lavage with Suction - Normal Saline Used 2000 mL  Pulsed Lavage Tip Tip with splash shield  Pulsed lavage therapy - wound location 2 sacral wounds and buttock tunnel  Selective Debridement  Selective Debridement - Location sacral wounds 1 and 2  Selective Debridement - Tools Used Forceps;Scalpel;Scissors  Selective Debridement - Tissue Removed large portions of eschar; some portions of yellow slough  Wound Therapy - Assess/Plan/Recommendations  Wound Therapy - Clinical Statement During debridement, tunneling wound evolution was noted connecting tunnel to  lateral portion of left buttock ound. Significant amounts  of necrotic tissue was sharply debrided revealing opening of approximately 4 x 6 cm with tunnel connection and large circumferential undermining 5cm. Other portions of wound demonstrates steady progress in wound healing and is responding well to hydrotherapy and debridement procedures. There is a decrease in non-viable tissue and appearance of increased granulation in wound beds. Will continue hydrotherapy as indicated to facilitate further healing.  Wound Therapy - Functional Problem List decreased mobility; increased risk of infection secondary to poor skin integrity.  Factors Delaying/Impairing Wound Healing Infection - systemic/local;Immobility;Multiple medical problems;Vascular compromise;Incontinence  Hydrotherapy Plan Debridement;Dressing change;Patient/family education;Pulsatile lavage with suction;Other (comment)  Wound Therapy - Frequency 6X / week  Wound Therapy - Follow Up Recommendations Skilled nursing facility  Wound Plan Continue as indicated above  Wound Therapy Goals - Improve the function of patient's integumentary system by progressing the wound(s) through the phases of wound healing by:  Decrease Necrotic Tissue to 20%  Decrease Necrotic Tissue - Progress Progressing toward goal  Increase Granulation Tissue to 50%  Increase Granulation Tissue - Progress Progressing toward goal  Goals/treatment plan/discharge plan were made with and agreed upon by patient/family Yes  Time For Goal Achievement 7 days  Wound Therapy - Potential for Goals Fair     Charlotte Crumb, PT DPT  [phone]

## 2012-07-19 NOTE — Progress Notes (Signed)
Patient ID: BASTIEN STRAWSER, male   DOB: [birth year], 61 y.o.   MRN: [id number]    Subjective: Pt without complaints  Objective: Vital signs in last 24 hours: Temp:  [97.5 F (36.4 C)-98.7 F (37.1 C)] 98.1 F (36.7 C) (12/11 1137) Pulse Rate:  [80-93] 83  (12/11 1137) Resp:  [19-24] 21  (12/11 1137) BP: (103-133)/(44-56) 133/52 mmHg (12/11 1137) SpO2:  [92 %-100 %] 100 % (12/11 1137) Weight:  [212 lb 11.9 oz (96.5 kg)] 212 lb 11.9 oz (96.5 kg) (12/10 2355) Last BM Date: 07/18/12  Intake/Output from previous day: 12/10 0701 - 12/11 0700 In: 852 [P.O.:720; IV Piggyback:132] Out: 175 [Urine:100; Stool:75] Intake/Output this shift:    PE: Buttock: wound is making some progress with hydrotherapy.  There is still a great deal of fibrinous tissue, but some viable beefy red tissue visible through the thinning necrotic tissue.  The wound in the upper left portion of the buttock is mostly necrotic and likely will tunnel and connect with the smaller opening just below it.    Lab Results:  No results found for this basename: WBC:2,HGB:2,HCT:2,PLT:2 in the last 72 hours BMET  Basename 07/17/12 0700  NA 142  K 3.4*  CL 87*  CO2 43*  GLUCOSE 80  BUN 17  CREATININE 0.77  CALCIUM 9.2   PT/INR No results found for this basename: LABPROT:2,INR:2 in the last 72 hours CMP     Component Value Date/Time   NA 142 07/17/2012 0700   K 3.4* 07/17/2012 0700   CL 87* 07/17/2012 0700   CO2 43* 07/17/2012 0700   GLUCOSE 80 07/17/2012 0700   BUN 17 07/17/2012 0700   CREATININE 0.77 07/17/2012 0700   CALCIUM 9.2 07/17/2012 0700   PROT 7.1 07/13/2012 0500   ALBUMIN 1.8* 07/13/2012 0500   AST 21 07/13/2012 0500   ALT 25 07/13/2012 0500   ALKPHOS 137* 07/13/2012 0500   BILITOT 0.2* 07/13/2012 0500   GFRNONAA >90 07/17/2012 0700   GFRAA >90 07/17/2012 0700   Lipase     Component Value Date/Time   LIPASE 41 10/14/2008 1350       Studies/Results: Ir Fluoro Guide Cv Line Right  07/18/2012   *RADIOLOGY REPORT*  PICC PLACEMENT WITH ULTRASOUND AND FLUOROSCOPIC  GUIDANCE  Clinical History: Sepsis of uncertain etiology, pneumonia suspected culprit.  The patient will need IV antibiotics and a PICC is requested.  Fluoroscopy Time: 3.8 minutes.  Procedure:  The right arm was prepped with chlorhexidine, draped in the usual sterile fashion using maximum barrier technique (cap and mask, sterile gown, sterile gloves, large sterile sheet, hand hygiene and cutaneous antiseptic).  Local anesthesia was attained by infiltration with 1% lidocaine.  Ultrasound demonstrated patency of the right basilic vein, and this was documented with an image.  Under real-time ultrasound guidance, this vein was accessed with a 21 gauge micropuncture needle and image documentation was performed. Unfortunately, the wire could not be advanced one several centimeters into the vein raising concern for a possible stenosis versus vasospasm.  Needle was removed and hemostasis attained by manual pressure.  The arm was again evaluated with ultrasound in this time the right brachial vein was selected.  The vessel was punctured under direct sonographic guidance with a 21-gauge micropuncture needle.  An image was documented.  The needle was exchanged over a guidewire for a peel-away sheath through which a 40 cm 5 [place] dual lumen power injectable PICC was advanced, and positioned with its tip at the lower SVC/right atrial  junction. Initially, the catheter would not go past the clavicle. A limited venogram was performed which demonstrated previously seen right subclavian vein venous stent.  The stent is widely patent. The catheter was hanging out on the interstices.  Therefore, a wire was used to navigate the PICC catheter through the center of the stent without passing through the interstices.  Fluoroscopy during the procedure and fluoro spot radiograph confirms appropriate catheter position.  The catheter was flushed, secured to the skin with  Prolene sutures, and covered with a sterile dressing.  Complications:  None.  The patient tolerated the procedure well.  IMPRESSION:  1.  Successful placement of a right arm PICC with sonographic and fluoroscopic guidance.  The catheter is ready for use.  2.  Incidentally, the patient has a patent right subclavian venous stent which is the likely etiology of the difficulty in passing the PICC experienced by the IV team.  If the patient were to require a PICC in the future, consider using left arm approach.  Signed,  Sterling Big, MD Vascular & Interventional Radiologist [facility]   Original Report Authenticated By: Malachy Moan, M.D.    Ir US Guide Vasc Access Right  07/18/2012  *RADIOLOGY REPORT*  PICC PLACEMENT WITH ULTRASOUND AND FLUOROSCOPIC  GUIDANCE  Clinical History: Sepsis of uncertain etiology, pneumonia suspected culprit.  The patient will need IV antibiotics and a PICC is requested.  Fluoroscopy Time: 3.8 minutes.  Procedure:  The right arm was prepped with chlorhexidine, draped in the usual sterile fashion using maximum barrier technique (cap and mask, sterile gown, sterile gloves, large sterile sheet, hand hygiene and cutaneous antiseptic).  Local anesthesia was attained by infiltration with 1% lidocaine.  Ultrasound demonstrated patency of the right basilic vein, and this was documented with an image.  Under real-time ultrasound guidance, this vein was accessed with a 21 gauge micropuncture needle and image documentation was performed. Unfortunately, the wire could not be advanced one several centimeters into the vein raising concern for a possible stenosis versus vasospasm.  Needle was removed and hemostasis attained by manual pressure.  The arm was again evaluated with ultrasound in this time the right brachial vein was selected.  The vessel was punctured under direct sonographic guidance with a 21-gauge micropuncture needle.  An image was documented.  The needle was  exchanged over a guidewire for a peel-away sheath through which a 40 cm 5 [place] dual lumen power injectable PICC was advanced, and positioned with its tip at the lower SVC/right atrial junction. Initially, the catheter would not go past the clavicle. A limited venogram was performed which demonstrated previously seen right subclavian vein venous stent.  The stent is widely patent. The catheter was hanging out on the interstices.  Therefore, a wire was used to navigate the PICC catheter through the center of the stent without passing through the interstices.  Fluoroscopy during the procedure and fluoro spot radiograph confirms appropriate catheter position.  The catheter was flushed, secured to the skin with Prolene sutures, and covered with a sterile dressing.  Complications:  None.  The patient tolerated the procedure well.  IMPRESSION:  1.  Successful placement of a right arm PICC with sonographic and fluoroscopic guidance.  The catheter is ready for use.  2.  Incidentally, the patient has a patent right subclavian venous stent which is the likely etiology of the difficulty in passing the PICC experienced by the IV team.  If the patient were to require a PICC in the future, consider  using left arm approach.  Signed,  Sterling Big, MD Vascular & Interventional Radiologist [facility]   Original Report Authenticated By: Malachy Moan, M.D.    Dg Chest Port 1 View  07/17/2012  *RADIOLOGY REPORT*  Clinical Data: Line placement.  PORTABLE CHEST - 1 VIEW  Comparison: 07/16/2012 CT and chest x-ray.  Findings: It appears a right PICC line has been placed and curls upon itself in the region axilla and does not enter central vasculature.  This needs to be repositioned.  Subclavian stent.  Cardiomegaly.  Pulmonary vascular congestion/edema.  Pleural effusions greater left.  Lower lobe atelectasis/infiltrate greater on the left unchanged.  Prominence of mediastinum.  Please see recent CT report.   IMPRESSION: It appears a right PICC line has been placed and curls upon itself in the region axilla and does not enter central vasculature.  This needs to be repositioned.  This has been made a PRA call report utilizing dashboard call feature.   Original Report Authenticated By: Lacy Duverney, M.D.     Anti-infectives: Anti-infectives     Start     Dose/Rate Route Frequency Ordered Stop   07/17/12 2200  piperacillin-tazobactam (ZOSYN) IVPB 3.375 g       3.375 g 12.5 mL/hr over 240 Minutes Intravenous Every 8 hours 07/17/12 1812 07/19/12 0255   07/15/12 0800   vancomycin (VANCOCIN) 1,250 mg in sodium chloride 0.9 % 250 mL IVPB  Status:  Discontinued        1,250 mg 166.7 mL/hr over 90 Minutes Intravenous Every 24 hours 07/14/12 1352 07/17/12 0932   07/13/12 0000   vancomycin (VANCOCIN) IVPB 1000 mg/200 mL premix  Status:  Discontinued        1,000 mg 200 mL/hr over 60 Minutes Intravenous Every 12 hours 07/12/12 1429 07/14/12 1352   07/12/12 2200   piperacillin-tazobactam (ZOSYN) IVPB 3.375 g  Status:  Discontinued        3.375 g 12.5 mL/hr over 240 Minutes Intravenous 3 times per day 07/12/12 1429 07/12/12 1939   07/12/12 2000   piperacillin-tazobactam (ZOSYN) IVPB 3.375 g  Status:  Discontinued        3.375 g 12.5 mL/hr over 240 Minutes Intravenous Every 8 hours 07/12/12 1939 07/17/12 1812   07/12/12 1745   levofloxacin (LEVAQUIN) IVPB 750 mg  Status:  Discontinued        750 mg 100 mL/hr over 90 Minutes Intravenous Every 24 hours 07/12/12 1711 07/13/12 1654   07/12/12 1715   piperacillin-tazobactam (ZOSYN) IVPB 3.375 g  Status:  Discontinued        3.375 g 12.5 mL/hr over 240 Minutes Intravenous 3 times per day 07/12/12 1711 07/12/12 1717   07/12/12 1430   vancomycin (VANCOCIN) 1,500 mg in sodium chloride 0.9 % 500 mL IVPB        1,500 mg 250 mL/hr over 120 Minutes Intravenous  Once 07/12/12 1429 07/12/12 1652   07/12/12 1400   vancomycin (VANCOCIN) IVPB 1000 mg/200 mL premix   Status:  Discontinued        1,000 mg 200 mL/hr over 60 Minutes Intravenous  Once 07/12/12 1353 07/12/12 1427   07/12/12 1400  piperacillin-tazobactam (ZOSYN) IVPB 3.375 g       3.375 g 12.5 mL/hr over 240 Minutes Intravenous  Once 07/12/12 1353 07/12/12 1453           Assessment/Plan  1. unstageable sacral decubitus ulcer 2. Paraplegia  Plan: 1. Wound is making improvements with hydrotherapy and santyl cream.  Continue  current care.  LOS: 7 days    Shaivi Rothschild E 07/19/2012, 11:43 AM Pager: [phone]

## 2012-07-19 NOTE — Progress Notes (Signed)
Wound clean

## 2012-07-19 NOTE — Progress Notes (Signed)
Patient [name]      DOB: 01-23-1951      YNW:[id number]  Case reviewed with Dr. Phillips Odor this am.  At this time goals have been established.  The Palliative Medicine team will sign off.  Please do not hesitate to reconsult for new issues.  See Dr. Lamar Blinks Progress note  12/10.   Onda Kattner L. Ladona Ridgel, MD MBA The Palliative Medicine Team at [facility] Phone: [phone] Pager: [phone]

## 2012-07-20 LAB — COMPREHENSIVE METABOLIC PANEL
Albumin: 2.2 g/dL — ABNORMAL LOW (ref 3.5–5.2)
BUN: 29 mg/dL — ABNORMAL HIGH (ref 6–23)
CO2: >45 mEq/L (ref 19–32)
Chloride: 84 mEq/L — ABNORMAL LOW (ref 96–112)
Creatinine, Ser: 0.82 mg/dL (ref 0.50–1.35)
GFR calc non Af Amer: >90 mL/min (ref >90)
Total Bilirubin: 0.2 mg/dL — ABNORMAL LOW (ref 0.3–1.2)

## 2012-07-20 LAB — CBC
HCT: 27.2 % — ABNORMAL LOW (ref 39.0–52.0)
MCV: 81.9 fL (ref 78.0–100.0)
RDW: 17.1 % — ABNORMAL HIGH (ref 11.5–15.5)
WBC: 8 10*3/uL (ref 4.0–10.5)

## 2012-07-20 LAB — MAGNESIUM: Magnesium: 1.6 mg/dL (ref 1.5–2.5)

## 2012-07-20 LAB — PHOSPHORUS: Phosphorus: 3 mg/dL (ref 2.3–4.6)

## 2012-07-21 LAB — BASIC METABOLIC PANEL
BUN: 27 mg/dL — ABNORMAL HIGH (ref 6–23)
CO2: >45 mEq/L (ref 19–32)
Calcium: 9.4 mg/dL (ref 8.4–10.5)
Creatinine, Ser: 0.64 mg/dL (ref 0.50–1.35)
Glucose, Bld: 160 mg/dL — ABNORMAL HIGH (ref 70–99)

## 2012-07-21 LAB — BLOOD GAS, ARTERIAL
Acid-Base Excess: 22.4 mmol/L — ABNORMAL HIGH (ref 0.0–2.0)
TCO2: 50.3 mmol/L (ref 0–100)
pCO2 arterial: 68.9 mmHg (ref 35.0–45.0)
pO2, Arterial: 54.4 mmHg — ABNORMAL LOW (ref 80.0–100.0)

## 2012-07-22 LAB — BASIC METABOLIC PANEL
BUN: 30 mg/dL — ABNORMAL HIGH (ref 6–23)
Chloride: 88 mEq/L — ABNORMAL LOW (ref 96–112)
GFR calc Af Amer: >90 mL/min (ref >90)
Glucose, Bld: 123 mg/dL — ABNORMAL HIGH (ref 70–99)
Potassium: 4.5 mEq/L (ref 3.5–5.1)

## 2012-07-25 LAB — CBC
HCT: 29.3 % — ABNORMAL LOW (ref 39.0–52.0)
Hemoglobin: 8.4 g/dL — ABNORMAL LOW (ref 13.0–17.0)
MCH: 24.1 pg — ABNORMAL LOW (ref 26.0–34.0)
MCHC: 28.7 g/dL — ABNORMAL LOW (ref 30.0–36.0)
RBC: 3.49 MIL/uL — ABNORMAL LOW (ref 4.22–5.81)

## 2012-07-25 LAB — BASIC METABOLIC PANEL
BUN: 44 mg/dL — ABNORMAL HIGH (ref 6–23)
CO2: 41 mEq/L (ref 19–32)
GFR calc non Af Amer: 89 mL/min — ABNORMAL LOW (ref >90)
Glucose, Bld: 107 mg/dL — ABNORMAL HIGH (ref 70–99)
Potassium: 4 mEq/L (ref 3.5–5.1)

## 2012-07-28 LAB — BLOOD GAS, ARTERIAL
Acid-Base Excess: 13.7 mmol/L — ABNORMAL HIGH (ref 0.0–2.0)
O2 Saturation: 88.1 %
TCO2: 42 mmol/L (ref 0–100)
pO2, Arterial: 56.7 mmHg — ABNORMAL LOW (ref 80.0–100.0)

## 2012-07-29 LAB — BASIC METABOLIC PANEL
BUN: 34 mg/dL — ABNORMAL HIGH (ref 6–23)
CO2: 36 mEq/L — ABNORMAL HIGH (ref 19–32)
Chloride: 90 mEq/L — ABNORMAL LOW (ref 96–112)
Glucose, Bld: 96 mg/dL (ref 70–99)
Potassium: 3.5 mEq/L (ref 3.5–5.1)
Sodium: 137 mEq/L (ref 135–145)

## 2012-08-02 LAB — CBC
HCT: 30.9 % — ABNORMAL LOW (ref 39.0–52.0)
Hemoglobin: 8.7 g/dL — ABNORMAL LOW (ref 13.0–17.0)
MCH: 23.7 pg — ABNORMAL LOW (ref 26.0–34.0)
MCHC: 28.2 g/dL — ABNORMAL LOW (ref 30.0–36.0)
MCV: 84.2 fL (ref 78.0–100.0)
RBC: 3.67 MIL/uL — ABNORMAL LOW (ref 4.22–5.81)

## 2012-08-02 LAB — BASIC METABOLIC PANEL
BUN: 39 mg/dL — ABNORMAL HIGH (ref 6–23)
CO2: 37 mEq/L — ABNORMAL HIGH (ref 19–32)
Calcium: 9.7 mg/dL (ref 8.4–10.5)
GFR calc non Af Amer: >90 mL/min (ref >90)
Glucose, Bld: 101 mg/dL — ABNORMAL HIGH (ref 70–99)

## 2012-08-05 LAB — CBC
MCH: 23.4 pg — ABNORMAL LOW (ref 26.0–34.0)
MCHC: 28 g/dL — ABNORMAL LOW (ref 30.0–36.0)
MCV: 83.7 fL (ref 78.0–100.0)
Platelets: 420 10*3/uL — ABNORMAL HIGH (ref 150–400)
RBC: 3.63 MIL/uL — ABNORMAL LOW (ref 4.22–5.81)

## 2012-08-05 LAB — BASIC METABOLIC PANEL
Chloride: 91 mEq/L — ABNORMAL LOW (ref 96–112)
GFR calc Af Amer: >90 mL/min (ref >90)
Potassium: 3.6 mEq/L (ref 3.5–5.1)

## 2012-08-08 LAB — BASIC METABOLIC PANEL
CO2: 37 mEq/L — ABNORMAL HIGH (ref 19–32)
Chloride: 95 mEq/L — ABNORMAL LOW (ref 96–112)
Potassium: 3.8 mEq/L (ref 3.5–5.1)
Sodium: 139 mEq/L (ref 135–145)

## 2012-08-08 LAB — CULTURE, BLOOD (ROUTINE X 2): Culture: NO GROWTH

## 2012-08-08 LAB — VALPROIC ACID LEVEL: Valproic Acid Lvl: 12.8 ug/mL — ABNORMAL LOW (ref 50.0–100.0)

## 2012-10-29 ENCOUNTER — Non-Acute Institutional Stay (SKILLED_NURSING_FACILITY): Payer: PRIVATE HEALTH INSURANCE | Admitting: Internal Medicine

## 2012-10-29 DIAGNOSIS — L89309 Pressure ulcer of unspecified buttock, unspecified stage: Secondary | ICD-10-CM

## 2012-10-30 NOTE — Progress Notes (Shared)
Patient ID: [name], male   DOB: 1950/09/04, 62 y.o.   MRN: [id number]  DATE:   10/26/2012     FACILITY: [facility]   LEVEL OF CARE: SNF  Acute Visit  CHIEF COMPLAINT:  Wound review.   HISTORY OF PRESENT ILLNESS:  Mr. Hazzard is a gentleman who is a [name] who has been in the facility for many years.      He has had multiple wounds on his coccyx area.  Sees Plastic Surgery.  He had recent plastic surgery for wound closure in the fall of 2013.  Unfortunately, the grafts did not really take.  He was left with a fairly substantial stage IV wound on the left buttock.   He also had considerable wounds surrounding this.  However, when I last saw these, these were hypergranulated.  We had been using a wound VAC on this area, perhaps contributing to the hypergranulation.  The plastic surgeon, in consultation, discontinued the wound VAC.  Since then, we have been using sliver alginate.  There is considerably more draining coming out of these wounds and I have been asked to look at this.    PHYSICAL EXAMINATION SKIN:  INSPECTION:  The original coccyx wound has closed in somewhat, although there is some slough in the lower aspect of this.  I suspect at some point this may need debridement.  The other areas are still draining copiously, although there is no clear infection.    ASSESSMENT/PLAN:  Multiple wounds on his buttocks and sacral areas as described (            ).  Change to Aquacel on these areas with ABD pads.  He will need to be changed daily at this point.  There is no concrete evidence of infection in any of these wounds, although I think this is going to need to be watched carefully.      CPT CODE: 40981.

## 2012-11-07 DIAGNOSIS — L0201 Cutaneous abscess of face: Secondary | ICD-10-CM

## 2012-11-07 DIAGNOSIS — I739 Peripheral vascular disease, unspecified: Secondary | ICD-10-CM

## 2012-11-07 DIAGNOSIS — Z936 Other artificial openings of urinary tract status: Secondary | ICD-10-CM

## 2012-11-07 DIAGNOSIS — L03211 Cellulitis of face: Secondary | ICD-10-CM

## 2012-11-07 DIAGNOSIS — E782 Mixed hyperlipidemia: Secondary | ICD-10-CM

## 2012-11-13 ENCOUNTER — Other Ambulatory Visit: Payer: Self-pay | Admitting: *Deleted

## 2012-11-13 MED ORDER — OXYCODONE HCL 5 MG PO TABA
5.0000 | ORAL_TABLET | Freq: Two times a day (BID) | ORAL | Status: DC
Start: 2012-11-13 — End: 2012-11-27

## 2012-11-13 MED ORDER — OXYCODONE HCL 5 MG PO TABS: ORAL_TABLET | ORAL | Status: DC

## 2012-11-13 MED ORDER — OXYCODONE HCL 5 MG PO TABS: 5.0000 mg | ORAL_TABLET | ORAL | Status: DC | PRN

## 2012-11-14 ENCOUNTER — Non-Acute Institutional Stay (SKILLED_NURSING_FACILITY): Payer: PRIVATE HEALTH INSURANCE | Admitting: Internal Medicine

## 2012-11-14 DIAGNOSIS — L89309 Pressure ulcer of unspecified buttock, unspecified stage: Secondary | ICD-10-CM

## 2012-11-14 DIAGNOSIS — L8990 Pressure ulcer of unspecified site, unspecified stage: Secondary | ICD-10-CM

## 2012-11-14 DIAGNOSIS — L8994 Pressure ulcer of unspecified site, stage 4: Secondary | ICD-10-CM

## 2012-11-14 NOTE — Progress Notes (Signed)
Patient ID: [name], male   DOB: 1950-09-01, 62 y.o.   MRN: [id number] Chief complaint; review of the coccyx and buttock wounds  History; [name] is a 62 year old man who is a long standing paraplegic since the mid 1970s. He is very significant lower extremity contractures and is reasonably immobile. He has a long history of sacral and buttock wounds and has had previous plastic surgery done at [facility] The most recent of these was in October 2013 when he underwent a flap closure of a major sacral decubitus of. Initially the flap to do quite well however the entire area dehisced and he has been left with a major sacral decubitus ulcer that at one point with stage IV, and more superficial wounds over his right buttock. We had a wound VAC on the major stage IV wound up until perhaps 6-8 weeks ago. However the wounds in proximity to the ulcer became hyper granulated since then we have been largely using Aquacel Ag and an occlusive dressing he also has a smaller wound on his left buttock and a additional wound on his left lateral malleolus.  Physical exam; Skin; original stage IV wound over his sacral area has almost completely granulated. The wounds over the right buttock to have advancing epithelialization over healthy granulation. No debridement was required. The small wound on the left buttock is also improving. The area over his left lateral malleolus stool is improving with central the condition of the granulation is better no mechanical debridement is required.  Impressions plan; #1 stage IV decubitus ulcer right sacral area. This is almost totally granulated. The more  superficial right buttock wounds  show healthy granulation and advancing epithelialization. We will continue with Aquacel Ag under an occlusive dressing to all of the buttock wound. #2 pressure area left lateral malleolus. This is making progress with sample which I will continue as long as we are making  progress towards resolution

## 2012-11-27 ENCOUNTER — Other Ambulatory Visit: Payer: Self-pay | Admitting: *Deleted

## 2012-11-27 MED ORDER — OXYCODONE HCL 5 MG PO TABA
5.0000 | ORAL_TABLET | Freq: Two times a day (BID) | ORAL | Status: DC
Start: 2012-11-27 — End: 2013-05-24

## 2012-11-28 ENCOUNTER — Non-Acute Institutional Stay (SKILLED_NURSING_FACILITY): Payer: PRIVATE HEALTH INSURANCE | Admitting: Internal Medicine

## 2012-11-28 DIAGNOSIS — L89309 Pressure ulcer of unspecified buttock, unspecified stage: Secondary | ICD-10-CM

## 2012-11-30 NOTE — Progress Notes (Signed)
Patient ID: [name], male   DOB: 02-Jul-1951, 62 y.o.   MRN: [id number]          PROGRESS NOTE  DATE:  11/28/2012  FACILITY: [id number]  LEVEL OF CARE:   SNF   Acute Visit   CHIEF COMPLAINT:   Wound review.    HISTORY OF PRESENT ILLNESS:  [name] is a gentleman who has extensive areas of wound involvement over his coccyx and his ischial tuberosities.  An attempt was made to close this area in the fall of 2013 by Plastic Surgery at [facility].  We have been left with a nonhealing area and two donor sites, the nonhealing areas over his coccyx, the donor sites over his right buttock.  He also has a deteriorating area over his right lateral malleolus.  All of these were pressure areas.  He is a gentleman who has been in the facility for many years, a chronic spinal cord injury.      PHYSICAL EXAMINATION:   SKIN:  INSPECTION:  The areas over his coccyx really look a lot better.  The original donor sites are also much, much improved.  There is drainage here.  We continue to use Aquacel AG with good improvement.  The original coccyx wound was a stage IV.  This is now completely granulated.  Over the right lateral malleolus, the wound is hypergranulated.  This will require silver alginate as well as a Kerlix/Coban wrap.  I will review these again in two weeks.    ASSESSMENT/PLAN:  Pressure ulcers as described above, buttock and right ankle.  There is no debridement here.  Actually, all of these look somewhat improved.  The only major change I am initiating is silver alginate with pressure dressings to the right lateral leg.     CPT CODE: 40981

## 2012-12-12 ENCOUNTER — Non-Acute Institutional Stay (SKILLED_NURSING_FACILITY): Payer: PRIVATE HEALTH INSURANCE | Admitting: Internal Medicine

## 2012-12-12 DIAGNOSIS — L8994 Pressure ulcer of unspecified site, stage 4: Secondary | ICD-10-CM

## 2012-12-12 DIAGNOSIS — L8990 Pressure ulcer of unspecified site, unspecified stage: Secondary | ICD-10-CM

## 2012-12-12 DIAGNOSIS — L89309 Pressure ulcer of unspecified buttock, unspecified stage: Secondary | ICD-10-CM

## 2012-12-15 NOTE — Progress Notes (Signed)
Patient ID: HARDEN BRAMER, male   DOB: Mar 14, 1951, 62 y.o.   MRN: [id number]          PROGRESS NOTE  DATE:  12/12/2012  FACILITY: [facility]   LEVEL OF CARE:   SNF   Acute Visit   CHIEF COMPLAINT:  Review of bilateral malleolar wounds on the left and extensive coccyx wounds.    HISTORY OF PRESENT ILLNESS:  I continue to follow Mr. Scholer in the facility with a visit every two weeks.  The wounds on his buttocks were initially previous pressure ulcers with previous plastic surgery.  He developed a refractory wound in the fall of 2013.  He went for a flap closure by Plastics at [facility].   There was initially good response.  However, these eventually opened up again.  The wounds he has are the original wounds and I think graft sites.  In any case, we VAC'ed this for a considerable period of time.  However, the VAC caused hypergranulation.  We have since been using silver alginate, I believe, which has done a reasonably good job.   These are healing quite nicely, albeit slowly.    He has developed a new area over the medial malleolus on the left ankle.  I have changed both of these dressings to a collagen-based dressing, covered with foam and Kerlix/Coban.  Previous vascular work-up showed an ABI of 0.92 with biphasic waveforms.  There should be enough blood flow for healing here.       ASSESSMENT/PLAN:  Buttocks wounds as described.  These are progressing.  No change in dressing.    Bimalleolar wounds on the left.  We have changed to collagen/Hydrogel.  I will review these again in two weeks.

## 2012-12-19 ENCOUNTER — Other Ambulatory Visit: Payer: Self-pay | Admitting: *Deleted

## 2012-12-19 MED ORDER — OXYCODONE HCL 5 MG PO TABS: ORAL_TABLET | ORAL | Status: DC

## 2012-12-26 DIAGNOSIS — G40909 Epilepsy, unspecified, not intractable, without status epilepticus: Secondary | ICD-10-CM

## 2012-12-26 DIAGNOSIS — L8992 Pressure ulcer of unspecified site, stage 2: Secondary | ICD-10-CM

## 2012-12-26 DIAGNOSIS — Z936 Other artificial openings of urinary tract status: Secondary | ICD-10-CM

## 2012-12-26 DIAGNOSIS — D0439 Carcinoma in situ of skin of other parts of face: Secondary | ICD-10-CM

## 2013-01-09 ENCOUNTER — Non-Acute Institutional Stay (SKILLED_NURSING_FACILITY): Payer: PRIVATE HEALTH INSURANCE | Admitting: Internal Medicine

## 2013-01-09 DIAGNOSIS — L89309 Pressure ulcer of unspecified buttock, unspecified stage: Secondary | ICD-10-CM

## 2013-01-15 DIAGNOSIS — F331 Major depressive disorder, recurrent, moderate: Secondary | ICD-10-CM

## 2013-01-15 DIAGNOSIS — N319 Neuromuscular dysfunction of bladder, unspecified: Secondary | ICD-10-CM

## 2013-01-15 DIAGNOSIS — D509 Iron deficiency anemia, unspecified: Secondary | ICD-10-CM

## 2013-01-16 ENCOUNTER — Other Ambulatory Visit: Payer: Self-pay | Admitting: *Deleted

## 2013-01-16 MED ORDER — OXYCODONE-ACETAMINOPHEN 7.5-325 MG PO TABS: ORAL_TABLET | ORAL | Status: DC

## 2013-01-16 NOTE — Progress Notes (Shared)
Patient ID: [name], male   DOB: Feb 27, 1951, 62 y.o.   MRN: [id number]           PROGRESS NOTE  DATE:  01/09/2013  FACILITY: [facility]    LEVEL OF CARE:   SNF   Acute Visit   CHIEF COMPLAINT:  Wound review.    HISTORY OF PRESENT ILLNESS:  I see [name] episodically for review of wounds, mostly on his buttocks and coccyx area.  He had plastic surgery on this area to close a stage IV wound probably 6-7 months ago.  The area that was grafted is considerably better.  However, the graft sites themselves have never really closed.  We have been using Aquacel AG on this and we have been making progress, albeit slow.  The original graft site, however, is considerably better.    He also has two small wounds on the lateral and medial malleolus on his left foot.  We have been using collagen to these areas.  They have made improvements, but are just not closing over.    PHYSICAL EXAMINATION:   SKIN:  INSPECTION:  Essentially as above.  The area over his coccyx, which was the original wound, has hypergranulated and I may need to do some debridement here.  The donor sites are much improved superficial areas.  However, they are only slowly closing.  We have been using Aquacel AG over this large area and I think making progress, albeit slowly.  The area over his bilateral malleoli are similar small wounds.  They appear somewhat macerated.  I will change this to silver alginate.    ASSESSMENT/PLAN:  Pressure wounds over his ankle.  I will change these to silver alginate.  Wounds over the malleoli are never easy to heal.  Nevertheless, these are small, do not appear to be infected.    Pressure area on the coccyx with surgical wounds, nonhealing.   I really do not see any benefit to changing the dressings over these large areas.  He tells me he is going to see Plastic Surgery in 2-3 weeks.  We will see if they have any other suggestions.    CPT CODE: 40981

## 2013-02-06 ENCOUNTER — Non-Acute Institutional Stay (SKILLED_NURSING_FACILITY): Payer: PRIVATE HEALTH INSURANCE | Admitting: Internal Medicine

## 2013-02-06 DIAGNOSIS — L89309 Pressure ulcer of unspecified buttock, unspecified stage: Secondary | ICD-10-CM

## 2013-02-15 NOTE — Progress Notes (Signed)
Patient ID: [name], male   DOB: 10-05-1950, 62 y.o.   MRN: [id number]           PROGRESS NOTE  DATE:  02/06/2013  FACILITY: [facility]   LEVEL OF CARE:   SNF   Acute Visit   CHIEF COMPLAINT:  Wound review.    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  He has a history of colostomy and suprapubic catheter subsequent to this.    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  He has been followed by a Engineer, petroleum at [facility] and had surgery on this, I believe last year.  The flap closure was initially successful.  However, the area dehisced and the graft sites dehisced.  He was left with a stage IV wound on his buttocks as well as open wounds from the grafting.  With time, these have miraculously done quite well and the stage IV wound has almost completely closed over.  We have been using silver alginate over this entire area.   The graft sites are gradually epithelializing, dressing these daily due to drainage.    His legs are in a contracted state, externally rotated at the hips and ankles.   He is flexed at the knees.  He has had an area on the left medial malleolus which is gradually closing over.  He has a new wound over the right lateral lower extremity.  This is a clean wound.  His skin flakes and this may have something to do with opening of these wounds.  He will need lubricating skin cream to prevent this.  I think his legs will also need to be wrapped as there is hypergranulation, especially on the left medial malleolus.    PHYSICAL EXAMINATION:   SKIN:  INSPECTION:  Wound exam as above.    ASSESSMENT/PLAN:  Orders have been left to reflect our discussion with the wound care nurse.  These are gradually improving.  CPT CODE: 14782 Immunologist)

## 2013-02-20 ENCOUNTER — Non-Acute Institutional Stay (SKILLED_NURSING_FACILITY): Payer: PRIVATE HEALTH INSURANCE | Admitting: Internal Medicine

## 2013-02-20 DIAGNOSIS — L89509 Pressure ulcer of unspecified ankle, unspecified stage: Secondary | ICD-10-CM

## 2013-02-20 DIAGNOSIS — L8994 Pressure ulcer of unspecified site, stage 4: Secondary | ICD-10-CM

## 2013-02-20 DIAGNOSIS — L8990 Pressure ulcer of unspecified site, unspecified stage: Secondary | ICD-10-CM

## 2013-02-20 NOTE — Progress Notes (Signed)
Patient ID: [name], male   DOB: [birth year], 62 y.o.   MRN: [id number]  facility; [facility]. Chief complaint; [facility] monthly visit for June, review of deteriorating buttock wounds. History; [name] is a 62 year old man who has been in the facility for many years. 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 The. He has placement of a colostomy and a suprapubic catheter. 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 This underwent a flap closure over a year ago. I believe by plastics at [employer]. Brachial asleep. The original wound has closed over, but I believe graft sites on his right buttock have not. He was recently given permission to be up in his chair. He chair for 2 hours a day. However, this sometimes extends for longer than active. In any case they're of a deterioration in the wounds over his buttock, including 2 wounds inferiorly on the right and a further wound on the medial aspect. There is still copious amounts of drainage from the wounds on the buttock. He also has wounds predominantly on his left lower extremity. These are fixed in tight flexion contracture. Vascular evaluation previously does not show significant macrovascular disease. He does have dry flaking skin that we are trying to address  Past medical history  stage IV right buttock sacral ulcer. This is actually closed over. However, the original graft sites are still open. 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 5 seizure disorder. 6 anemia of chronic disease. 7 congestive heart failure  Physical examination; Respiratory clear entry bilaterally. Cardiac heart sounds are normal. There is no murmurs. No signs of CHF. Abdomen left lower lobe ostomy suprapubic catheter. Skin he still has large beefy-red open areas on his buttock severe. These are going to continue to need  daily dressings with Aquacel Ag. The recent deterioration is probably due to the fact that he is up in the chair. Extremities using a #10 blade I not down the hyper granulation tissue over the wound on the medial malleolus. I will switch the dressings to collagen 2,  Impression/plan #1 decubitus ulcers over his right buttock. These were actually graft site. He will need to remain off these and I discussed this with him. #2 decubitus ulcerations on his left medial and left lateral ankle change dressings to collagen Kerlix Coban. #3 seizure disorder. No seizures since 2008. His Keppra was the seat in February. He has had no further seizures. He is on Depakote for mood

## 2013-03-19 ENCOUNTER — Other Ambulatory Visit: Payer: Self-pay | Admitting: Geriatric Medicine

## 2013-03-19 MED ORDER — OXYCODONE-ACETAMINOPHEN 7.5-325 MG PO TABS: ORAL_TABLET | ORAL | Status: DC

## 2013-03-27 ENCOUNTER — Non-Acute Institutional Stay (SKILLED_NURSING_FACILITY): Payer: PRIVATE HEALTH INSURANCE | Admitting: Internal Medicine

## 2013-03-27 DIAGNOSIS — L8990 Pressure ulcer of unspecified site, unspecified stage: Secondary | ICD-10-CM

## 2013-03-27 DIAGNOSIS — D509 Iron deficiency anemia, unspecified: Secondary | ICD-10-CM

## 2013-03-27 DIAGNOSIS — L8994 Pressure ulcer of unspecified site, stage 4: Secondary | ICD-10-CM

## 2013-03-27 NOTE — Progress Notes (Signed)
Patient ID: [name], male   DOB: 1951/04/04, 62 y.o.   MRN: [id number]  facility; [facility]. Chief complaint; [facility] monthly visit for July, review of deteriorating buttock wounds. History; [name] is a 62 year old man who has been in the facility for many years. 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 The. He has placement of a colostomy and a suprapubic catheter. 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 This underwent a flap closure over a year ago. I believe by plastics at [employer]. Brachial asleep. The original wound has closed over, but I believe graft sites on his right buttock have not. He was recently given permission to be up in his chair. He chair for 2 hours a day. However, this sometimes extends for longer than active. In any case they're of a deterioration in the wounds over his buttock, including 2 wounds inferiorly on the right and a further wound on the medial aspect. There is still copious amounts of drainage from the wounds on the buttock. He also has wounds predominantly on his left lower extremity. These are fixed in tight flexion contracture. Vascular evaluation previously does not show significant macrovascular disease. He does have dry flaking skin that we are trying to address  There has been increasing concern with regards the patient's hemoglobin. 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 His lab work from 03/23/2013 showed a hemoglobin of 7.6 MCV of 72.7 and a total iron of 12 for. His ferritin was 35. Believe we have guaiac stool in the past although I don't see these results. He also has chronic blood loss from his decubitus ulcers and I wonder if this could be a potential source of this.  Past medical history  stage IV right buttock sacral ulcer. This is actually closed over. However, the original graft sites are still open. 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 5 seizure disorder. 6 anemia of chronic disease. 7 congestive heart failure 8) chronic microcytic hypochromic anemia  Physical examination; Respiratory clear entry bilaterally. Cardiac heart sounds are normal. There is no murmurs. No signs of CHF. Abdomen left lower lobe ostomy suprapubic catheter. Skin he still has large beefy-red open areas on his buttock severe. These are going to continue to need daily dressings with Aquacel Ag. The recent deterioration is probably due to the fact that he is up in the chair. Extremities using a #10 blade I not down the hyper granulation tissue over the wound on the medial malleolus. I will switch the dressings to collagen 2,  Impression/plan #1 decubitus ulcers over his right buttock. These were actually graft site. He will need to remain off these and I discussed this with him. #2 decubitus ulcerations on his left medial and left lateral ankle change dressings to collagen Kerlix Coban. #3 seizure disorder. No seizures since 2008. His Keppra was discontinued in February. He has had no further seizures. He is on Depakote for mood #4 microcytic hyperchromic anemia; this was previously worked up in 2010 I believe although I cannot exactly find the results of the workup not performed it. He at least had a negative EGD, I can see references to colonoscopy but I cannot find that report.wonder if some of this could be blood loss from the sacral decubiti which we've been dealing with her most of the last year  he has had guaiac positive stool in the past . He is apparently guiac negative

## 2013-04-17 ENCOUNTER — Non-Acute Institutional Stay (SKILLED_NURSING_FACILITY): Payer: PRIVATE HEALTH INSURANCE | Admitting: Internal Medicine

## 2013-04-17 ENCOUNTER — Other Ambulatory Visit: Payer: Self-pay | Admitting: *Deleted

## 2013-04-17 DIAGNOSIS — L89309 Pressure ulcer of unspecified buttock, unspecified stage: Secondary | ICD-10-CM

## 2013-04-17 DIAGNOSIS — L8994 Pressure ulcer of unspecified site, stage 4: Secondary | ICD-10-CM

## 2013-04-17 DIAGNOSIS — L8990 Pressure ulcer of unspecified site, unspecified stage: Secondary | ICD-10-CM

## 2013-04-17 MED ORDER — OXYCODONE-ACETAMINOPHEN 7.5-325 MG PO TABS: ORAL_TABLET | ORAL | Status: DC

## 2013-04-17 NOTE — Progress Notes (Signed)
Patient ID: [name], male   DOB: 1950/11/09, 62 y.o.   MRN: [id number] Facility [facility] Chief complaint; wound review Chief complaint; I been following [name] for wounds on his coccyx and buttocks. Initially the area here was a stage IV coccyx wound. He went for plastic surgery the graft sites over his right buttocks at the current open wounds. We've been applying Aquacel Ag to these areas and making very gradual progression towards closure. The original coccyx wound is long since closed. He has scattered lower extremity wounds which I think are a combination of extremely dry skin probable PAD.Marland Kitchen we have also been using silver alginate to this area  Physical exam; Skin; there are fairly large wounds over the right buttock area which are gradually progressing towards closure. There is healthy granulation here. We are using Aquacel Ag and should continue. The superficial wounds on predominantly his left lower extremity we have been using silver alginate as well as bag balm to the normal skin. Respiratory; clear entry bilaterally Cardiac heart sounds are normal no murmurs Abdomen no liver no spleen no tenderness.  Impression/plan Decubitus ulcers. We're making good progress here. I see no reason to change the treatment to. I will review these in 2 weeks

## 2013-05-01 ENCOUNTER — Non-Acute Institutional Stay (SKILLED_NURSING_FACILITY): Payer: PRIVATE HEALTH INSURANCE | Admitting: Internal Medicine

## 2013-05-01 DIAGNOSIS — L89309 Pressure ulcer of unspecified buttock, unspecified stage: Secondary | ICD-10-CM

## 2013-05-01 DIAGNOSIS — D509 Iron deficiency anemia, unspecified: Secondary | ICD-10-CM

## 2013-05-01 NOTE — Progress Notes (Signed)
Patient ID: [name], male   DOB: June 30, 1951, 62 y.o.   MRN: [id number]  facility; [facility]. Chief complaint; [facility] monthly visit for August, review of deteriorating buttock wounds. History; [name] is a 62 year old man who has been in the facility for many years. 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 The. He has placement of a colostomy and a suprapubic catheter. 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 This underwent a flap closure over a year ago. I believe by plastics at [employer]. Brachial asleep. The original wound has closed over, but I believe graft sites on his right buttock have not. He was recently given permission to be up in his chair. He chair for 2 hours a day. However, this sometimes extends for longer than active. In any case they're of a deterioration in the wounds over his buttock, including 2 wounds inferiorly on the right and a further wound on the medial aspect. There is still copious amounts of drainage from the wounds on the buttock. He also has wounds predominantly on his left lower extremity. These are fixed in tight flexion contracture. Vascular evaluation previously does not show significant macrovascular disease. He does have dry flaking skin that we are trying to address  There has been increasing concern with regards the patient's hemoglobin. 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 His lab work from 03/23/2013 showed a hemoglobin of 7.6 MCV of 72.7 and a total iron of 12 for. His ferritin was 35. Believe we have guaiac stool in the past and these have been negative. He also has chronic blood loss from his decubitus ulcers and I wonder if this could be a potential source of this.  Past medical history  stage IV right buttock sacral ulcer. This is actually closed over. However, the original graft sites are still open. 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 5 seizure disorder. 6 anemia of chronic disease. 7 congestive heart failure 8) chronic microcytic hypochromic anemia  Physical examination; Respiratory clear entry bilaterally. Cardiac heart sounds are normal. There is no murmurs. No signs of CHF. Abdomen left lower lobe ostomy suprapubic catheter. Skin he still has large beefy-red open areas on his buttock severe. These are going to continue to need daily dressings with Aquacel Ag. The recent deterioration is probably due to the fact that he is up in the chair. Extremities using a #10 blade I not down the hyper granulation tissue over the wound on the medial malleolus. I will switch the dressings to collagen 2,  Last lab showed an iron of 19 a TIBC of 251. His hemoglobin is 9.4 MCV of 70.9 MCV H. of 22.3 his RDW is 17.3  Impression/plan #1 decubitus ulcers over his right buttock. These were actually graft site. He will need to remain off these and I discussed this with him. #2 decubitus ulcerations on his left medial and left lateral ankle change dressings to collagen Kerlix Coban. #3 seizure disorder. No seizures since 2008. His Keppra was discontinued in February. He has had no further seizures. He is on Depakote for mood #4 microcytic hyperchromic anemia; this was previously worked up in 2010 I believe although I cannot exactly find the results of the workup not performed it. He at least had a negative EGD, I can see references to colonoscopy but I cannot  find that report.wonder if some of this could be blood loss from the sacral decubiti which we've been dealing with her most of the last year. He has not had guaiac positive stool in the past . He is apparently guiac negative. His hemoglobin has not responded to iron started in April 2014 he does not have any symptoms appear he has an appointment with gastroenterology on September 29 for consideration of a colonoscopy  although we have never documented GI blood loss appear. I suspect this is blood loss from his chronic decubitus ulcers although the thought about a colonoscopy is not unreasonable. His hemoglobin appears to be responding to iron therapy

## 2013-05-24 ENCOUNTER — Encounter (HOSPITAL_COMMUNITY): Payer: Self-pay | Admitting: Pharmacy Technician

## 2013-05-25 ENCOUNTER — Ambulatory Visit (HOSPITAL_COMMUNITY)
Admission: RE | Admit: 2013-05-25 | Discharge: 2013-05-25 | Disposition: A | Discharge status: Skilled Nursing Facility | Payer: PRIVATE HEALTH INSURANCE | Source: Ambulatory Visit | Attending: Gastroenterology | Admitting: Gastroenterology

## 2013-05-25 ENCOUNTER — Encounter (HOSPITAL_COMMUNITY)
Admission: RE | Disposition: A | Discharge status: Skilled Nursing Facility | Payer: Self-pay | Source: Ambulatory Visit | Attending: Gastroenterology

## 2013-05-25 ENCOUNTER — Ambulatory Visit (HOSPITAL_COMMUNITY): Payer: PRIVATE HEALTH INSURANCE | Admitting: Anesthesiology

## 2013-05-25 ENCOUNTER — Encounter (HOSPITAL_COMMUNITY): Payer: PRIVATE HEALTH INSURANCE | Admitting: Anesthesiology

## 2013-05-25 ENCOUNTER — Encounter (HOSPITAL_COMMUNITY): Payer: Self-pay | Admitting: Anesthesiology

## 2013-05-25 DIAGNOSIS — D509 Iron deficiency anemia, unspecified: Secondary | ICD-10-CM | POA: Insufficient documentation

## 2013-05-25 DIAGNOSIS — I2510 Atherosclerotic heart disease of native coronary artery without angina pectoris: Secondary | ICD-10-CM | POA: Insufficient documentation

## 2013-05-25 DIAGNOSIS — K219 Gastro-esophageal reflux disease without esophagitis: Secondary | ICD-10-CM | POA: Insufficient documentation

## 2013-05-25 DIAGNOSIS — G8220 Paraplegia, unspecified: Secondary | ICD-10-CM | POA: Insufficient documentation

## 2013-05-25 HISTORY — PX: ESOPHAGOGASTRODUODENOSCOPY (EGD) WITH PROPOFOL: SHX5813

## 2013-05-25 HISTORY — PX: COLONOSCOPY WITH PROPOFOL: SHX5780

## 2013-05-25 LAB — HEMOGLOBIN AND HEMATOCRIT, BLOOD
HCT: 31.3 % — ABNORMAL LOW (ref 39.0–52.0)
Hemoglobin: 9.1 g/dL — ABNORMAL LOW (ref 13.0–17.0)

## 2013-05-25 SURGERY — ESOPHAGOGASTRODUODENOSCOPY (EGD) WITH PROPOFOL
Anesthesia: Monitor Anesthesia Care

## 2013-05-25 MED ORDER — BUTAMBEN-TETRACAINE-BENZOCAINE 2-2-14 % EX AERO
INHALATION_SPRAY | CUTANEOUS | Status: DC | PRN
  Administered 2013-05-25: 1 via TOPICAL

## 2013-05-25 MED ORDER — KETAMINE HCL 10 MG/ML IJ SOLN
INTRAMUSCULAR | Status: DC | PRN
  Administered 2013-05-25 (×3): 10 mg via INTRAVENOUS
  Administered 2013-05-25: 5 mg via INTRAVENOUS

## 2013-05-25 MED ORDER — LIDOCAINE HCL (CARDIAC) 20 MG/ML IV SOLN
INTRAVENOUS | Status: DC | PRN
  Administered 2013-05-25: 50 mg via INTRAVENOUS

## 2013-05-25 MED ORDER — PROPOFOL INFUSION 10 MG/ML OPTIME
INTRAVENOUS | Status: DC | PRN
  Administered 2013-05-25: 100 ug/kg/min via INTRAVENOUS

## 2013-05-25 MED ORDER — LACTATED RINGERS IV SOLN
INTRAVENOUS | Status: DC
  Administered 2013-05-25: 11:00:00 via INTRAVENOUS

## 2013-05-25 MED ORDER — PROPOFOL 10 MG/ML IV BOLUS
INTRAVENOUS | Status: DC | PRN
  Administered 2013-05-25 (×2): 30 mg via INTRAVENOUS

## 2013-05-25 SURGICAL SUPPLY — 25 items

## 2013-05-25 NOTE — Transfer of Care (Signed)
Immediate Anesthesia Transfer of Care Note  Patient: [name]  Procedure(s) Performed: Procedure(s): ESOPHAGOGASTRODUODENOSCOPY (EGD) WITH PROPOFOL (N/A) COLONOSCOPY WITH PROPOFOL (N/A)  Patient Location: PACU  Anesthesia Type:MAC  Level of Consciousness: awake, alert  and oriented  Airway & Oxygen Therapy: Patient Spontanous Breathing and Patient connected to nasal cannula oxygen  Post-op Assessment: Report given to PACU RN and Post -op Vital signs reviewed and stable  Post vital signs: Reviewed and stable  Complications: No apparent anesthesia complications

## 2013-05-25 NOTE — Preoperative (Signed)
Beta Blockers   Reason not to administer Beta Blockers:Not Applicable 

## 2013-05-25 NOTE — Progress Notes (Signed)
05-23-13 Multiple attempts to get faxed info and talk with nurse at [facility], never received any info  or able to talk directly to nurse to give pre-procedure instructions. Jill(Endo) made aware. W. Kennon Portela

## 2013-05-25 NOTE — Op Note (Signed)
[facility] [address] [place], [address]   ENDOSCOPY PROCEDURE REPORT  PATIENT: [name], [name]  MR#: [id number] BIRTHDATE: 12/22/1950 , 61  yrs. old GENDER: Male ENDOSCOPIST: Wandalee Ferdinand, MD REFERRED BY: PROCEDURE DATE:  05/25/2013 PROCEDURE:   EGD ASA CLASS: 3 INDICATIONS: iron deficiency anemia MEDICATIONS: propofol per anesthesia TOPICAL ANESTHETIC: Cetacaine spray  DESCRIPTION OF PROCEDURE:   After the risks benefits and alternatives of the procedure were thoroughly explained, informed consent was obtained.  The Pentax       endoscope was introduced through the mouth and advanced to the second portion of the duodenum      , limited by Without limitations.   The instrument was slowly withdrawn as the mucosa was fully examined.      FINDINGS:  Esophagus: Normal  Stomach: Mildly erythematous mucosa but otherwise normal.  Duodenum: Normal  COMPLICATIONS:  ENDOSCOPIC IMPRESSION:essentially normal upper endoscopy. There is nothing on this exam to explain anemia.   RECOMMENDATIONS:iron supplementation per PCP and follow H&H.      _______________________________ Rosalie DoctorWandalee Ferdinand, MD 05/25/2013 11:10 AM       PATIENT NAME:  [name], [name] MR#: [id number]

## 2013-05-25 NOTE — Op Note (Signed)
[facility] [address] [place], [address]   COLONOSCOPY PROCEDURE REPORT  PATIENT: [name], [name]  MR#: [id number] BIRTHDATE: 11-24-1950 , 61  yrs. old GENDER: Male ENDOSCOPIST: Wandalee Ferdinand, MD REFERRED BY: PROCEDURE DATE:  05/25/2013 PROCEDURE:   colonoscopy via colostomy ASA CLASS:   3 INDICATIONS:iron deficiency anemia MEDICATIONS: propofol per anesthesia  DESCRIPTION OF PROCEDURE:   After the risks benefits and alternatives of the procedure were thoroughly explained, informed consent was obtained.  digital of colostomy negative        The Pentax Ped Colon [id number]  endoscope was not introduced through the anus because his anus has been sewn shut and he has a colostomy instead it was inserted into the colostomy and advanced to the cecum     . No adverse events experienced.   The quality of the prep was adequate       The instrument was then slowly withdrawn as the colon was fully examined.  The scope was brought up visualizing the mucosa from the base of the cecum to the end of the colostomy and no lesions were seen. The exam was essentially normal. There is nothing on this exam to explain anemia.    The time to cecum=  .  Withdrawal time=  .  The scope was withdrawn and the procedure completed. COMPLICATIONS: There were no complications.  ENDOSCOPIC IMPRESSION:normal from colostomy stoma to cecum  RECOMMENDATIONS:iron supplementation. Followup with PCP   eSigned:  Wandalee Ferdinand, MD 05/25/2013 11:17 AM   cc:

## 2013-05-25 NOTE — Anesthesia Preprocedure Evaluation (Addendum)
Anesthesia Evaluation  Patient identified by MRN, date of birth, ID band Patient awake    Reviewed: Allergy & Precautions, H&P , NPO status , Patient's Chart, lab work & pertinent test results  Airway Mallampati: III TM Distance: >3 FB Neck ROM: Full    Dental  (+) Edentulous Upper and Edentulous Lower   Pulmonary neg pulmonary ROS, pneumonia -,  Hx of Acute Respiratory Failure breath sounds clear to auscultation  Pulmonary exam normal       Cardiovascular + CAD and +CHF Rhythm:Regular Rate:Normal     Neuro/Psych Seizures -, Well Controlled,  Paraplegic T7-T8, hx altered mental status; patient awake, alert, oriented X 3 day of procedure.  Neuromuscular disease negative neurological ROS  negative psych ROS   GI/Hepatic Neg liver ROS, GERD-  ,  Endo/Other  negative endocrine ROSMorbid obesity  Renal/GU negative Renal ROS  negative genitourinary   Musculoskeletal   Abdominal (+) + obese,   Peds negative pediatric ROS (+)  Hematology  (+) Blood dyscrasia, anemia ,   Anesthesia Other Findings   Reproductive/Obstetrics                         Anesthesia Physical Anesthesia Plan  ASA: III  Anesthesia Plan: MAC   Post-op Pain Management:    Induction: Intravenous  Airway Management Planned: Nasal Cannula and Simple Face Mask  Additional Equipment:   Intra-op Plan:   Post-operative Plan:   Informed Consent: I have reviewed the patients History and Physical, chart, labs and discussed the procedure including the risks, benefits and alternatives for the proposed anesthesia with the patient or authorized representative who has indicated his/her understanding and acceptance.   Dental advisory given  Plan Discussed with: CRNA  Anesthesia Plan Comments:         Anesthesia Quick Evaluation

## 2013-05-25 NOTE — Anesthesia Postprocedure Evaluation (Signed)
Anesthesia Post Note  Patient: [name]  Procedure(s) Performed: Procedure(s) (LRB): ESOPHAGOGASTRODUODENOSCOPY (EGD) WITH PROPOFOL (N/A) COLONOSCOPY WITH PROPOFOL (N/A)  Anesthesia type: MAC  Patient location: PACU  Post pain: Pain level controlled  Post assessment: Post-op Vital signs reviewed  Last Vitals:  Filed Vitals:   05/25/13 1150  BP: 105/56  Temp:   Resp: 12    Post vital signs: Reviewed  Level of consciousness: sedated  Complications: No apparent anesthesia complications

## 2013-05-28 ENCOUNTER — Encounter (HOSPITAL_COMMUNITY): Payer: Self-pay | Admitting: Gastroenterology

## 2013-05-28 NOTE — H&P (Signed)
PostdatedH&P for outpatient procedure  The patient is a 62 year old male who comes to outpatient endoscopy for EGD and colonoscopy to evaluate for iron deficiency anemia.  CT scan H&P from office. No changes.  Physical:  He is in no distress  Heart regular rhythm no murmurs  Lungs clear  Abdomen: Soft, nontender, colostomy present  Impression: Iron deficiency anemia  Plan: EGD and colonoscopy

## 2013-05-29 ENCOUNTER — Non-Acute Institutional Stay (SKILLED_NURSING_FACILITY): Payer: PRIVATE HEALTH INSURANCE | Admitting: Internal Medicine

## 2013-05-29 DIAGNOSIS — L89309 Pressure ulcer of unspecified buttock, unspecified stage: Secondary | ICD-10-CM

## 2013-05-29 DIAGNOSIS — D509 Iron deficiency anemia, unspecified: Secondary | ICD-10-CM

## 2013-05-29 NOTE — Progress Notes (Signed)
Patient ID: [name], male   DOB: [birth year], 62 y.o.   MRN: [id number] P  facility; [facility]. Chief complaint; [facility] monthly visit for Spetember, review of deteriorating buttock wounds. History; [name] is a 62 year old man who has been in the facility for many years. 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 The. He has placement of a colostomy and a suprapubic catheter. 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 This underwent a flap closure over a year ago. I believe by plastics at [employer]. . The original wound has closed over, but I believe graft sites on his right buttock have not. He was recently given permission to be up in his chair. He chair for 2 hours a day. However, this sometimes extends for longer than active. In any case they're of a deterioration in the wounds over his buttock, including 2 wounds inferiorly on the right and a further wound on the medial aspect. There is still copious amounts of drainage from the wounds on the buttock. He also has wounds predominantly on his left lower extremity. These are fixed in tight flexion contracture. Vascular evaluation previously does not show significant macrovascular disease. He does have dry flaking skin that we are trying to address  He has wounds on the right heel which is probably largely a pressure area and a small area on the left anterior leg  There has been increasing concern with regards the patient's hemoglobin. 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 His lab work from 03/23/2013 showed a hemoglobin of 7.6 MCV of 72.7 and a total iron of 12 for. His ferritin was 35. Believe we have guaiac stool in the past and these have been negative. He also has chronic blood loss from his decubitus ulcers and I wonder if this could be a potential source of this.  Past medical history  stage IV right buttock sacral ulcer.  This is actually closed over. However, the original graft sites are still open. 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 5 seizure disorder. 6 anemia of chronic disease. 7 congestive heart failure 8) chronic microcytic hypochromic anemia  Physical examination; Respiratory clear entry bilaterally. Cardiac heart sounds are normal. There is no murmurs. No signs of CHF. Abdomen left lower lobe ostomy suprapubic catheter. Skin he still has large beefy-red open areas on his buttock severe. These are going to continue to need daily dressings with Aquacel Ag. The recent deterioration is probably due to the fact that he is up in the chair. Extremities using a #10 blade I not down the hyper granulation tissue over the wound on the medial malleolus. I will switch the dressings to collagen 2,  Last lab showed an iron of 19 a TIBC of 251. His hemoglobin is 9.4 MCV of 70.9 MCV H. of 22.3 his RDW is 17.3  Impression/plan #1 decubitus ulcers over his right buttock. These were actually graft site. He will need to remain off these and I discussed this with him. There is apparently a significant amount of bloody drainage from these wounds. This obviously could be the source of his microcytic anemia. Continue the Aquacel Ag I really can't think of a better alternative. He is not felt to be a further surgical candidate #2 decubitus ulcerations on his right heel. This is being dressed with silver alternate in foam  I think this should continue change dressings to collagen Kerlix Coban. Regards to the ulcers/surgical graft sites on his box I think we should continue the Aquacel Ag at this point I really can't think of a better alternative. He already has seen plastic surgery who don't don't want to consider any further surgery #3 seizure disorder. No seizures since 2008. His Keppra was discontinued in February. He has had no further seizures. He is on Depakote for  mood #4 microcytic hyperchromic anemia; this was previously worked up in 2010 I believe although I cannot exactly find the results of the workup not performed it. He at least had a negative EGD, I can see references to colonoscopy but I cannot find that report.wonder if some of this could be blood loss from the sacral decubiti which we've been dealing with her most of the last year. He has not had guaiac positive stool in the past . He is apparently guiac negative. His hemoglobin has not responded to iron started in April 2014 he does not have any symptoms. He has an appointment with the GI Dr. Dondra Prader. Consideration would be for an upper endoscopy as well as a colonoscopy. If this is negative consider IV iron

## 2013-06-19 ENCOUNTER — Ambulatory Visit (HOSPITAL_COMMUNITY)
Admission: RE | Admit: 2013-06-19 | Discharge: 2013-06-19 | Disposition: A | Discharge status: Home / Self Care | Payer: PRIVATE HEALTH INSURANCE | Source: Ambulatory Visit | Attending: Internal Medicine | Admitting: Internal Medicine

## 2013-06-19 ENCOUNTER — Other Ambulatory Visit (HOSPITAL_BASED_OUTPATIENT_CLINIC_OR_DEPARTMENT_OTHER): Payer: Self-pay | Admitting: Internal Medicine

## 2013-06-19 ENCOUNTER — Non-Acute Institutional Stay (SKILLED_NURSING_FACILITY): Payer: PRIVATE HEALTH INSURANCE | Admitting: Internal Medicine

## 2013-06-19 DIAGNOSIS — L8990 Pressure ulcer of unspecified site, unspecified stage: Secondary | ICD-10-CM

## 2013-06-19 DIAGNOSIS — T798XXA Other early complications of trauma, initial encounter: Secondary | ICD-10-CM

## 2013-06-19 DIAGNOSIS — D509 Iron deficiency anemia, unspecified: Secondary | ICD-10-CM

## 2013-06-19 DIAGNOSIS — T1490XA Injury, unspecified, initial encounter: Secondary | ICD-10-CM | POA: Diagnosis present

## 2013-06-19 DIAGNOSIS — X58XXXA Exposure to other specified factors, initial encounter: Secondary | ICD-10-CM | POA: Insufficient documentation

## 2013-06-19 DIAGNOSIS — L0231 Cutaneous abscess of buttock: Secondary | ICD-10-CM

## 2013-06-19 DIAGNOSIS — L8994 Pressure ulcer of unspecified site, stage 4: Secondary | ICD-10-CM

## 2013-06-19 MED ORDER — HEPARIN SOD (PORK) LOCK FLUSH 100 UNIT/ML IV SOLN
INTRAVENOUS | Status: AC
  Filled 2013-06-19: qty 5

## 2013-06-19 NOTE — Procedures (Signed)
Successful placement of power injectable single lumen lumen PICC line to left brachial vein. Length 50cm Tip at lower SVC/RA No complications Ready for use.  Pattricia Boss PA-C Interventional Radiology  06/19/13  2:30 PM

## 2013-06-19 NOTE — Progress Notes (Signed)
Patient ID: [name], male   DOB: 18-Oct-1950, 62 y.o.   MRN: [id number]   facility; [facility]. Chief complaint; [facility] monthly visit for October, review of deteriorating buttock wounds. History; [name] is a 62 year old man who has been in the facility for many years. 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 The. He has placement of a colostomy and a suprapubic catheter. 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 This underwent a flap closure over a year ago. I believe by plastics at [employer]. . The original wound has closed over, but I believe graft sites on his right buttock have not. He was recently given permission to be up in his chair. He chair for 2 hours a day. However, this sometimes extends for longer than active. In any case they're of a deterioration in the wounds over his buttock, including 2 wounds inferiorly on the right and a further wound on the medial aspect. There is still copious amounts of drainage from the wounds on the buttock. He also has wounds predominantly on his left lower extremity. These are fixed in tight flexion contracture. Vascular evaluation previously does not show significant macrovascular disease. He does have dry flaking skin that we are trying to address it  The major issue is the 2 graft site wounds on the right buttock kept deteriorating. Especially the one superiorly is now covered with a nonviable eschar and this extended towards the coccyx. More worrisome than this there is considerable erythema around the wound especially superiorly with black and necrotic tissue.  He has wounds on the right heel which has an enlarged however does not look particularly infected  There has been increasing concern with regards the patient's hemoglobin. 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 His lab work from 03/23/2013 showed a hemoglobin of 7.6 MCV of  72.7 and a total iron of 12 for. His ferritin was 35. Believe we have guaiac stool in the past and these have been negative. He also has chronic blood loss from his decubitus ulcers and I wonder if this could be a potential source of this.  Labs from September 17 show an iron of 19, TIBC of 251. His hemoglobin is 9.4 MCV of 70.9.  Past medical history  stage IV right buttock sacral ulcer. This is actually closed over. However, the original graft sites are still open. 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 5 seizure disorder. 6 anemia of chronic disease. 7 congestive heart failure 8) chronic microcytic hypochromic anemia  Physical examination; Respiratory clear entry bilaterally. Cardiac heart sounds are normal. There is no murmurs. No signs of CHF. Abdomen left lower lobe ostomy suprapubic catheter. Skin; as noted above the wounds on the right gluteal fold have deteriorated markedly there is surrounding infection here especially superior to the upper wound  Last lab showed an iron of 19 a TIBC of 251. His hemoglobin is 9.4 MCV of 70.9 MCV H. of 22.3 his RDW is 17.3  Impression/plan #1 decubitus ulcers over his right buttock. This is markedly deteriorated over the last 2 weeks. There is associated infection here with a white count of 14.1 80% granulocytes. Area will need to be offloaded. It is reported that he has been training "feculent" material from his anal opening which is probably just secretion says he has a functioning colostomy, in  any case this may have contaminated the wound and apparently his pressure relief mattress has also been malfunctioning according to the wound care nurse. This is obviously a deteriorating situation which if it deteriorates further will require hospitalization. He will require IV antibiotics, VAcn and a B lactam.  #2 decubitus ulcerations on his right heel. This is being dressed with silver alternate in  foam I think this should continue change dressings to silver collagen. Regards to the ulcers/surgical graft sites on his box I think we should continue the Aquacel Ag at this point I really can't think of a better alternative. He already has seen plastic surgery who don't don't want to consider any further surgery #3 seizure disorder. No seizures since 2008. His Keppra was discontinued in February. He has had no further seizures. He is on Depakote for mood #4 microcytic hyperchromic anemia; this was previously worked up in 2010 I believe although I cannot exactly find the results of the workup not performed it. He at least had a negative EGD  And colonoscopy in October. His hemoglobin has improved although we may need to consider meds some point for IV iron if this deteriorates? Iron malabsorption

## 2013-06-26 ENCOUNTER — Non-Acute Institutional Stay (SKILLED_NURSING_FACILITY): Payer: PRIVATE HEALTH INSURANCE | Admitting: Internal Medicine

## 2013-06-26 DIAGNOSIS — L0231 Cutaneous abscess of buttock: Secondary | ICD-10-CM

## 2013-06-26 DIAGNOSIS — L89309 Pressure ulcer of unspecified buttock, unspecified stage: Secondary | ICD-10-CM

## 2013-07-02 ENCOUNTER — Other Ambulatory Visit (HOSPITAL_COMMUNITY): Payer: Self-pay | Admitting: *Deleted

## 2013-07-03 ENCOUNTER — Encounter (HOSPITAL_COMMUNITY)
Admission: RE | Admit: 2013-07-03 | Discharge: 2013-07-03 | Disposition: A | Discharge status: Home / Self Care | Payer: PRIVATE HEALTH INSURANCE | Source: Ambulatory Visit | Attending: Internal Medicine | Admitting: Internal Medicine

## 2013-07-03 DIAGNOSIS — D509 Iron deficiency anemia, unspecified: Secondary | ICD-10-CM | POA: Insufficient documentation

## 2013-07-03 LAB — PREPARE RBC (CROSSMATCH)

## 2013-07-03 MED ORDER — SODIUM CHLORIDE 0.9 % IV SOLN
Freq: Once | INTRAVENOUS | Status: AC
  Administered 2013-07-03: 11:00:00 via INTRAVENOUS

## 2013-07-04 LAB — TYPE AND SCREEN
ABO/RH(D): A NEG
Antibody Screen: POSITIVE
DAT, IgG: POSITIVE
Unit division: 0

## 2013-07-10 ENCOUNTER — Encounter: Payer: Self-pay | Admitting: Internal Medicine

## 2013-07-10 ENCOUNTER — Non-Acute Institutional Stay (SKILLED_NURSING_FACILITY): Payer: PRIVATE HEALTH INSURANCE | Admitting: Internal Medicine

## 2013-07-10 DIAGNOSIS — L89309 Pressure ulcer of unspecified buttock, unspecified stage: Secondary | ICD-10-CM

## 2013-07-10 DIAGNOSIS — D509 Iron deficiency anemia, unspecified: Secondary | ICD-10-CM

## 2013-07-10 DIAGNOSIS — L0231 Cutaneous abscess of buttock: Secondary | ICD-10-CM

## 2013-07-10 NOTE — Progress Notes (Signed)
This encounter was created in error - please disregard.

## 2013-07-10 NOTE — Progress Notes (Signed)
Patient ID: [name], male   DOB: 05/11/1951, 62 y.o.   MRN: [id number]  facility; [facility] Chief complaint; wound review, anemia review History ; I been following this patient for a prolonged period of time for what was initially a stage IV wound over his coccyx. This was ultimately closed surgically by plastics at [facility]. The 2 skin harvest sites are on his right buttock or the remaining wounds. Recently the patient developed an acute infection involving both of these sites. We treated him with IV antibiotics with improvement in the wounds however the superior wound on the right buttock has at least 50% of its area covered by an adherent eschar. I debridement some of this 2 weeks ago. The infected part of this has involved the areas appear to be healthier.  The patient also has refractory anemia. This is assumed to up in secondary to chronic blood loss. I believe he has had a completed GI workup including a recent colonoscopy through his ostomy site which did not show any particularly worrisome lesions. The extent of his rest of his GI workup will need to review. In any case he needed to be transfused to a hemoglobin of 6.6. He has microcytic hypochromic indices. He was transfused 2 units of packed cells will need to followup on his hemoglobin. He has been on iron without any obvious response.  Physical examination Skin; he has 2 circular wounds over his right buttock a. The superior one is larger and at least 50% of its areas covered by an adherent eschar although it appears to be loosening we are using Santyl and hydrogel based dressings to this. The other wound areas look satisfactory the concerning areas of spreading cellulitis he had 2 weeks ago appear to be a lot better  Impression/plan #1 chronic pressure ulcers although these areas were originally surgical harvest sites. The infection part of this is resolving. I see no reason to change his current dressing which is Santyl base. He  has wounds on his lower extremities I did not look at these today but we'll followup on this at a later date #2 anemia this is assumed to be iron deficiency with microcytic hypochromic indices his iron studies have supported iron deficiency is the diagnosis although the source of this has never really been completely clear [question blood loss from his wounds], the exact reason he doesn't respond to oral iron has been a bit of a puzzle as well. At this point I have no plans other than to serially follow this. I'm going to maintain his PICC line for now

## 2013-07-10 NOTE — Progress Notes (Signed)
Patient ID: [name], male   DOB: 09-07-1950, 62 y.o.   MRN: [id number]           PROGRESS NOTE  DATE:  06/26/2013  FACILITY:  [facility]    LEVEL OF CARE:   SNF   Acute Visit   CHIEF COMPLAINT:  Follow up deterioration in buttock wounds and associated infection.      HISTORY OF PRESENT ILLNESS:  I saw [name] a week ago, at which time there had been considerable deterioration in his remaining wounds over his right buttock.  (Please see my note of 06/19/2013.)  He had a white count with a left shift with a considerable degree of infection which I responded to with vancomycin and zosyn.  I am seeing this today in follow-up.    LABORATORY DATA:   Last lab work showed a white count of 8.6, a hemoglobin of 7.2, BUN of 25, a creatinine of 0.97.  He is considerably microcytic and hypochromic with an MCV of 69.4.    REVIEW OF SYSTEMS:   CARDIAC:   No chest pain.    CHEST/RESPIRATORY:  No cough.  No sputum.   SKIN:  The wound care nurse says he is being compliant with offloading.    PHYSICAL EXAMINATION:   GENERAL APPEARANCE:  The patient is not in any distress.   SKIN:  INSPECTION:  The two original areas, which I think were graft sites, look healthier.  There is less erythema, less duskiness around these wounds.  This certainly led me to believe that there was significant infection around this, which is considerably better.  The more medial area towards the midline is covered with a thick, black, necrotic, adherent eschar.  Using pickups and a #10 scalpel, I debrided as much off of this as I could.  There is not much in the way of soft tissue underneath this.  Fortunately, there was no exposed bone.    ASSESSMENT/PLAN:  Decubitus ulcers as noted above.  We will continue the Aquacel AG.  However, I am going to add Santyl to the area with the eschar and I have talked to the wound care nurse about this.  He underwent a fairly extensive bedside debridement today, as noted.     Cellulitis and/or fasciitis around these wounds.  This appears to be considerably better than when I saw this last week.  His white count is down.    Anemia.  Microcytic, hypochromic, with a hemoglobin of 7.2.  He recently underwent a GI work-up for this, including a negative colonoscopy.  This was from his colostomy to the cecum.   This is probably a mixture of chronic disease and maybe iron deficiency from his bleeding.   In any case, it is not impossible that he will require transfusion.    CPT CODE: 78295    ADDENDUM:  He is on iron three times a day.  I do not believe he ever really showed a reticulocyte count.  His serum iron is 19, TIBC 251, ferritin level 42 in September.  Last reticulocyte count was 63.3 which is not elevated, percentage of 1.5.

## 2013-07-17 ENCOUNTER — Non-Acute Institutional Stay (SKILLED_NURSING_FACILITY): Payer: PRIVATE HEALTH INSURANCE | Admitting: Internal Medicine

## 2013-07-17 DIAGNOSIS — L8990 Pressure ulcer of unspecified site, unspecified stage: Secondary | ICD-10-CM

## 2013-07-17 DIAGNOSIS — L89309 Pressure ulcer of unspecified buttock, unspecified stage: Secondary | ICD-10-CM

## 2013-07-17 DIAGNOSIS — L8994 Pressure ulcer of unspecified site, stage 4: Secondary | ICD-10-CM

## 2013-07-17 NOTE — Progress Notes (Signed)
Patient ID: [name], male   DOB: 11-23-1950, 62 y.o.   MRN: [id number]  facility; [facility] Chief complaint; wound review, anemia review History ; I been following this patient for a prolonged period of time for what was initially a stage IV wound over his coccyx. This was ultimately closed surgically by plastics at [facility]. The 2 skin harvest sites are on his right buttock or the remaining wounds. Recently the patient developed an acute infection involving both of these sites. We treated him with IV antibiotics with improvement in the wounds however the superior wound on the right buttock has at least 50% of its area covered by an adherent eschar. I debridement some of this 2 weeks ago. The infected part of this has involved the areas appear to be healthier.  The patient also has refractory anemia. This is assumed to up in secondary to chronic blood loss. I believe he has had a completed GI workup including a recent colonoscopy through his ostomy site which did not show any particularly worrisome lesions. The extent of his rest of his GI workup will need to review. In any case he needed to be transfused to a hemoglobin of 6.6. He has microcytic hypochromic indices. He was transfused 2 units of packed cells and the posttransfusion hemoglobin is 9.2  Physical examination Skin; he has 2 circular wounds over his right buttock a. The superior one is larger and at least 50% of its areas covered by an adherent eschar although it appears to be loosening we are using Santyl and hydrogel based dressings to this. The other wound areas look satisfactory the concerning areas of spreading cellulitis he had 2 weeks ago appear to be a lot better.. there is no evidence of infection The other area of concern continues to be the Achilles area of the right heel which has a large open area which I consider to be a pressure area. He also has smaller areas on the plantar foot which are more difficult to  explain  Impression/plan #1 chronic pressure ulcers although these areas were originally surgical harvest sites. The infection part of this is resolved. I see no reason to change his current dressing which is Santyl base. He has wounds on his lower extremities I did not look at these today but we'll followup on this at a later date. The areas on his buttock still bleeds fairly easily I am going to cover them with the Xeroform an ABG pads #2 anemia this is assumed to be iron deficiency with microcytic hypochromic indices his iron studies have supported iron deficiency is the diagnosis although the source of this has never really been completely clear [question blood loss from his wounds], the exact reason he doesn't respond to oral iron has been a bit of a puzzle as well. At this point I have no plans other than to serially follow this. I'm going to maintain his PICC line for now

## 2013-07-31 ENCOUNTER — Non-Acute Institutional Stay (SKILLED_NURSING_FACILITY): Payer: PRIVATE HEALTH INSURANCE | Admitting: Internal Medicine

## 2013-07-31 DIAGNOSIS — L8990 Pressure ulcer of unspecified site, unspecified stage: Secondary | ICD-10-CM

## 2013-07-31 DIAGNOSIS — L8994 Pressure ulcer of unspecified site, stage 4: Secondary | ICD-10-CM

## 2013-07-31 DIAGNOSIS — L89609 Pressure ulcer of unspecified heel, unspecified stage: Secondary | ICD-10-CM

## 2013-07-31 NOTE — Progress Notes (Signed)
Patient ID: [name], male   DOB: [birth year], 62 y.o.   MRN: [id number]  facility; [facility] Chief complaint; wound review, anemia review History ; I been following this patient for a prolonged period of time for what was initially a stage IV wound over his coccyx. This was ultimately closed surgically by plastics at [facility]. The 2 skin harvest sites are on his right buttock or the remaining wounds. Recently the patient developed an acute infection involving both of these sites. We treated him with IV antibiotics with improvement in the wounds however the superior wound on the right buttock has at least 50% of its area covered by an adherent eschar. I debridement some of this 2 weeks ago. The infected part of this has involved the areas appear to be healthier. The overall tissue here is appears to be stable. The eschar seems to be receding and softening without further mechanical debridement.  The patient also has a chronic wound on his right heel I changed this from [employer] and to silver alginate 2 weeks ago.  The patient also has refractory anemia. This is assumed to up in secondary to chronic blood loss. I believe he has had a completed GI workup including a recent colonoscopy through his ostomy site which did not show any particularly worrisome lesions. The extent of his rest of his GI workup will need to review. In any case he needed to be transfused to a hemoglobin of 6.6. He has microcytic hypochromic indices. He was transfused 2 units of packed cells and the posttransfusion hemoglobin is 9.2  Physical examination Skin; he has 2 circular wounds over his right buttock a. The superior one is larger and at least 50% of its areas covered by an adherent eschar although it appears to be loosening we are using Santyl and hydrogel based dressings to this. The other wound areas look satisfactory the concerning areas of spreading cellulitis he had 2 weeks ago appear to be a lot better..  there is no evidence of infection The other area of concern continues to be the Achilles area of the right heel which has a large open area. This area has not changed at all. It does not appear to be infected. I'm going to do a plain x-ray to rule out osteomyelitis  Impression/plan #1 chronic pressure ulcers although these areas were originally surgical harvest sites. The infection part of this is resolved. I see no reason to change his current dressing which is Santyl base. He has wounds on his lower extremities I did not look at these today but we'll followup on this at a later date. The areas on his buttock still bleeds fairly easily I am going to cover them with the Xeroform an ABG pads #2 anemia this is assumed to be iron deficiency with microcytic hypochromic indices his iron studies have supported iron deficiency is the diagnosis although the source of this has never really been completely clear [question blood loss from his wounds], the exact reason he doesn't respond to oral iron has been a bit of a puzzle as well. At this point I have no plans other than to serially follow this. I'm going to maintain his PICC line for now

## 2013-08-16 ENCOUNTER — Other Ambulatory Visit: Payer: Self-pay | Admitting: *Deleted

## 2013-08-16 MED ORDER — OXYCODONE-ACETAMINOPHEN 7.5-325 MG PO TABS: ORAL_TABLET | ORAL | Status: DC

## 2013-08-21 ENCOUNTER — Non-Acute Institutional Stay (SKILLED_NURSING_FACILITY): Payer: PRIVATE HEALTH INSURANCE | Admitting: Internal Medicine

## 2013-08-21 DIAGNOSIS — L89309 Pressure ulcer of unspecified buttock, unspecified stage: Secondary | ICD-10-CM

## 2013-08-21 NOTE — Progress Notes (Signed)
Patient ID: [name], male   DOB: Sep 14, 1950, 63 y.o.   MRN: [id number]  facility; [facility] Chief complaint; wound review, anemia review History ; I been following this patient for a prolonged period of time for what was initially a stage IV wound over his coccyx. This was ultimately closed surgically by [facility]. The 2 skin harvest sites are on his right buttock or the remaining wounds. 2 months ago the patient developed an acute infection involving both of these sites. We treated him with IV antibiotics with improvement in the wounds however the superior wound on the right buttock has at least 50% of its area covered by an adherent eschar. I debridement some of this 2 weeks ago. The infected part of this has involved the areas appear to be healthier. The overall tissue here is appears to be stable. The eschar seems to be receding and softening without further mechanical debridement.  The patient also has a chronic wound on his right heel I changed this from [employer] and to silver alginate 4weeks ago.  The patient also has refractory anemia. This is assumed to up in secondary to chronic blood loss. I believe he has had a completed GI workup including a recent colonoscopy through his ostomy site which did not show any particularly worrisome lesions. The extent of his rest of his GI workup will need to review. In any case he needed to be transfused to a hemoglobin of 6.6. He has microcytic hypochromic indices. He was transfused 2 units of packed cells and the posttransfusion hemoglobin is 9.2  Physical examination Skin; he has 2 circular wounds over his right buttock a. The superior one is larger however the degree of adherent eschar has improved with Santyl-based dressing site. There is a quarter sized area in the center of the wound however that continues to be problematic in this approaches bone. The inferior wound that continues to make considerable progress The other area  of concern continues to be the Achilles area of the right heel which has a large open area. This area has not changed at all. It does not appear to be infected. I'm going to do a plain x-ray to rule out osteomyelitis  Impression/plan #1 chronic pressure ulcers although these areas were originally surgical harvest sites. The infection part of this is resolved. The areas have considerable improvement on his biox on the right over the necrotic area in the middle of the superior wound may need some form of advanced treatment option continue Santyl for now #2 anemia this is assumed to be iron deficiency with microcytic hypochromic indices his iron studies have supported iron deficiency is the diagnosis although the source of this has never really been completely clear [question blood loss from his wounds], the exact reason he doesn't respond to oral iron has been a bit of a puzzle as well.

## 2013-09-04 ENCOUNTER — Non-Acute Institutional Stay (SKILLED_NURSING_FACILITY): Payer: PRIVATE HEALTH INSURANCE | Admitting: Internal Medicine

## 2013-09-04 DIAGNOSIS — L89309 Pressure ulcer of unspecified buttock, unspecified stage: Secondary | ICD-10-CM

## 2013-09-04 DIAGNOSIS — D509 Iron deficiency anemia, unspecified: Secondary | ICD-10-CM

## 2013-09-04 NOTE — Progress Notes (Signed)
Patient ID: [name], male   DOB: 08/17/1950, 63 y.o.   MRN: [id number]   facility; [facility] Chief complaint; wound review, anemia review. Routine [employer] visit for December.  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 This was ultimately closed surgically by plastics at [facility]. The 2 skin harvest sites are on his right buttock or the remaining wounds. Recently the patient developed an acute infection involving both of these sites. We treated him with IV antibiotics with improvement in the wounds however the superior wound on the right buttock has at least 50% of its area covered by an adherent eschar. In general these areas have improved there is less bleeding dressings are coming off easier we're still using Santyl with Xeroform and a foam cover.  The patient also has refractory anemia. This is assumed to up in secondary to chronic blood loss. I believe he has had a completed GI workup including a recent colonoscopy through his ostomy site which did not show any particularly worrisome lesions. The extent of his rest of his GI workup will need to review. In any case he needed to be transfused to a hemoglobin of 6.6. He has microcytic hypochromic indices. He was transfused 2 units of packed cells and the posttransfusion hemoglobin is 9.2. As it was last checked on 1/23 hemoglobin of 9.3 which is stable to improve  Review of systems; patient is complaining of a right-sided headache and localizes is tenderness to the region of the temporal artery. He has not had jaw claudication and/or visual changes nevertheless I think this justifies a sedimentation rate and C-reactive protein. Rule out temporal arteritis  Physical examination Skin; he has 2 circular wounds over his right buttock. Although these continue to look better. The superior wound still has a dime-sized area of eschar justifying continuing the  Santyl HEENT; tenderness noted over the right temporal arteries/temporal area. He localizes his pain to the site Cardiac heart sounds are normal there is no murmur Her exam is clear  Impression/plan #1 chronic pressure ulcers although these areas were originally surgical harvest sites. The infection part of this is resolved. I see no reason to change his current dressing which is Santyl base. He has wounds on his lower extremities I did not look at these today but we'll followup on this at a later date. The areas on his buttock still bleeds fairly easily I am going to cover them with the Xeroform an ABG pads #2 anemia this is assumed to be iron deficiency with microcytic hypochromic indices his iron studies have supported iron deficiency is the diagnosis although the source of this has never really been completely clear [question blood loss from his wounds]. This seems to be much more stable now perhaps it is wounds or not bleeding in such a copious fashion. #3 right temporal headache. I am a bit scared to order sedimentation rates and C-reactive proteins and patient like this man because there may be many reasons for these to be elevated nevertheless the location of his complaint and no tenderness justifies a C. reactive protein and ESR

## 2013-09-10 ENCOUNTER — Other Ambulatory Visit: Payer: Self-pay | Admitting: *Deleted

## 2013-09-10 MED ORDER — OXYCODONE-ACETAMINOPHEN 7.5-325 MG PO TABS: ORAL_TABLET | ORAL | Status: DC

## 2013-09-10 NOTE — Telephone Encounter (Signed)
[facility] 

## 2013-10-22 ENCOUNTER — Other Ambulatory Visit: Payer: Self-pay | Admitting: *Deleted

## 2013-10-22 MED ORDER — OXYCODONE-ACETAMINOPHEN 7.5-325 MG PO TABS: ORAL_TABLET | ORAL | Status: DC

## 2013-10-22 NOTE — Telephone Encounter (Signed)
[facility] 

## 2013-10-30 ENCOUNTER — Non-Acute Institutional Stay (SKILLED_NURSING_FACILITY): Payer: PRIVATE HEALTH INSURANCE | Admitting: Internal Medicine

## 2013-10-30 DIAGNOSIS — D509 Iron deficiency anemia, unspecified: Secondary | ICD-10-CM

## 2013-10-30 DIAGNOSIS — L89309 Pressure ulcer of unspecified buttock, unspecified stage: Secondary | ICD-10-CM

## 2013-10-30 NOTE — Progress Notes (Signed)
Patient ID: Maurice GravelHerman M Younge, male   DOB: 1951/07/18, 63 y.o.   MRN: [id number]   [facility] Chief complaint; wound review, anemia review. Routine [employer] visit for february.  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 This was ultimately closed surgically by [facility]. The 2 skin harvest sites are on his right buttock or the remaining wounds. Recently the patient developed an acute infection involving both of these sites. We treated him with IV antibiotics with improvement in the wounds however the superior wound on the right buttock has at least 50% of its area covered by an adherent eschar. In general these areas have improved there is less bleeding dressings are coming off easier we're still using Santyl with Xeroform and a foam cover.  The patient also has refractory anemia. This is assumed to up in secondary to chronic blood loss. I believe he has had a completed GI workup including a recent colonoscopy through his ostomy site which did not show any particularly worrisome lesions. The extent of his rest of his GI workup will need to review. In any case he needed to be transfused to a hemoglobin of 6.6. He has microcytic hypochromic indices. He was transfused 2 units of packed cells and the posttransfusion hemoglobin is 9.2. As it was last checked on 1/23 hemoglobin of 9.3 which is stable to improve  Review of systems;  Respiratory; no cough no shortness of breath Cardiac no complaints of chest pain GI ostomy moving well no issues. GU suprapubic catheter. There have been issues with leakage around the catheter site Musculoskeletal; continues to complain of spasms mostly in his right leg  Social; patient is a full code code and continues to desire aggressive care. In fact he states he would like to heal the wound so he can go home although I don't know that he actually has a home to go to, he  would take considerable resources to make this possible  Physical examination Temperature 80-respirations 18 weight 200 pounds Skin; the wounds that he has over his right buttock are actually harvest sites. There is healthy granulation here although there is not been major changes for closure. Wound care nurse notes occasional refusals of dressings.   Impression/plan #1 chronic pressure ulcers although these areas were originally surgical harvest sites. Will change to [employer] and then foam change every second day #2 anemia this is assumed to be iron deficiency with microcytic hypochromic indices his iron studies have supported iron deficiency is the diagnosis although the source of this has never really been completely clear [question blood loss from his wounds]. This seems to be much more stable now perhaps it is wounds or not bleeding.

## 2013-11-13 ENCOUNTER — Non-Acute Institutional Stay (SKILLED_NURSING_FACILITY): Payer: PRIVATE HEALTH INSURANCE | Admitting: Internal Medicine

## 2013-11-13 DIAGNOSIS — L0291 Cutaneous abscess, unspecified: Secondary | ICD-10-CM

## 2013-11-13 DIAGNOSIS — L0390 Cellulitis, unspecified: Secondary | ICD-10-CM

## 2013-11-14 NOTE — Progress Notes (Signed)
Patient ID: [name], male [name]  DOB: 1950-09-06, 63 y.o.   MRN: [id number]                   PROGRESS NOTE  DATE:  11/13/2013    [facility]    LEVEL OF CARE:   SNF   Acute Visit   CHIEF COMPLAINT:  Review of lesion on his chest.    HISTORY OF PRESENT ILLNESS:  I was asked to see this area by the wound care nurse.   Apparently, this was only discovered earlier this week.  The patient thinks that he was bitten by an insect, although I doubt this is the case.    PHYSICAL EXAMINATION:   SKIN:  INSPECTION:  Chest wall:  There is a tiny open area with what looks like purulent drainage with considerable surrounding erythema spreading roughly an inch in all directions.    ASSESSMENT/PLAN:  Chest wall lesion.  Possibly a skin abscess.  I have cultured this.  We will start him on silver alginate and, in terms of oral antibiotics, Septra DS until we see the culture.  He is allergic to quinolones and doxycycline.  This could also represent a sebaceous cyst.  The patient states he has a remote history of this type of lesion.  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  We will see what the culture shows.

## 2013-11-23 ENCOUNTER — Other Ambulatory Visit: Payer: Self-pay | Admitting: *Deleted

## 2013-11-23 MED ORDER — OXYCODONE-ACETAMINOPHEN 7.5-325 MG PO TABS: ORAL_TABLET | ORAL | Status: DC

## 2013-11-23 NOTE — Telephone Encounter (Signed)
[facility] @ [phone].

## 2013-11-27 ENCOUNTER — Non-Acute Institutional Stay (SKILLED_NURSING_FACILITY): Payer: PRIVATE HEALTH INSURANCE | Admitting: Internal Medicine

## 2013-11-27 DIAGNOSIS — L89309 Pressure ulcer of unspecified buttock, unspecified stage: Secondary | ICD-10-CM

## 2013-11-27 DIAGNOSIS — L89609 Pressure ulcer of unspecified heel, unspecified stage: Secondary | ICD-10-CM

## 2013-11-27 DIAGNOSIS — D509 Iron deficiency anemia, unspecified: Secondary | ICD-10-CM

## 2013-11-27 NOTE — Progress Notes (Signed)
Patient ID: [name], male   DOB: 1951/07/24, 63 y.o.   MRN: [id number]   [facility] Chief complaint; wound review, anemia review. Routine [employer] visit for March.  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 This was ultimately closed surgically by plastics [facility]. The 2 skin harvest sites are on his right buttock or the remaining wounds. Recently the patient developed an acute infection involving both of these sites. We treated him with IV antibiotics with improvement in the wounds however the superior wound on the right buttock has at least 50% of its area covered by an adherent eschar. In general these areas have improved there is less bleeding dressings are coming off easier we're still using collagen. Some of the surrounding skin looks macerated therefore will try Aquacel Ag although the nonhealing of these areas probably has to do with the amount of time he is flat on his buttocks while in bed  The patient also has refractory anemia. This is assumed to up in secondary to chronic blood loss. I believe he has had a completed GI workup including a recent colonoscopy through his ostomy site which did not show any particularly worrisome lesions. The extent of his rest of his GI workup will need to review. In any case he needed to be transfused to a hemoglobin of 6.6. He has microcytic hypochromic indices. He was transfused 2 units of packed cells and the posttransfusion hemoglobin is 9.2. As it was last checked on 4/15 hemoglobin of 9.5 which is stable to improved. His wounds are no longer bleeding is copiously as they once were  Review of systems;  Respiratory; no cough no shortness of breath Cardiac no complaints of chest pain GI ostomy moving well no issues. GU suprapubic catheter. There have been issues with leakage around the catheter site. There are orders from urology to use antispasmodic  medication ditropan Musculoskeletal; continues to complain of spasms mostly in his right leg  Social; patient is a full code code and continues to desire aggressive care. In fact he states he would like to heal the wound so he can go home although I don't know that he actually has a home to go to, he would take considerable resources to make this possible  Physical examination Vitals pulse 80 respirations 18 O2 sat is 92% on 2 L [wears 2 L at night] weight is 195 pounds Respiratory clear entry bilaterally Cardiac heart sounds are normal he appears to be euvolemic Skin; the wounds that he has over his right buttock are actually harvest sites. There is healthy granulation here although there is not been major changes. This may be accessed moisture however I suspect a lot of this is simply excess pressure. On his right leg he has some open areas on his heels and widespread flaking, dry, irritated erythematous skin that keeps on the open. Some of this is venous stasis I'm just not completely certain that all of it is.   Impression/plan #1 chronic pressure ulcers although these areas were originally surgical harvest sites. Will change to Aquacel Ag #2 anemia this is assumed to be iron deficiency with microcytic hypochromic indices his iron studies have supported iron deficiency is the diagnosis although the source of this has never really been completely clear [question blood loss from his wounds]. This seems to be much more stable now perhaps that his wounds are not bleeding. #3  lower extremity wounds which are multifactorial. His legs are fixed in full extension. Skin is dry flaky perhaps tinea???. He has pressure ulcers on the right heel and probably some degree of venous stasis

## 2013-11-27 NOTE — Progress Notes (Signed)
Patient ID: [name], male   DOB: 1951-03-20, 63 y.o.   MRN: [id number]

## 2013-12-18 ENCOUNTER — Non-Acute Institutional Stay (SKILLED_NURSING_FACILITY): Payer: PRIVATE HEALTH INSURANCE | Admitting: Internal Medicine

## 2013-12-18 DIAGNOSIS — L89609 Pressure ulcer of unspecified heel, unspecified stage: Secondary | ICD-10-CM

## 2013-12-18 DIAGNOSIS — L89309 Pressure ulcer of unspecified buttock, unspecified stage: Secondary | ICD-10-CM

## 2013-12-19 NOTE — Progress Notes (Addendum)
Patient ID: [name], male   DOB: 1951/08/07, 63 y.o.   MRN: [id number]                  PROGRESS NOTE  DATE:  12/18/2013    [facility]    LEVEL OF CARE:   SNF   Acute Visit   CHIEF COMPLAINT:  Review of wounds.    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  This was ultimately closed [facility].  There were two skin harvest sites on the right buttock, which have really been the non-closing wounds.  This area is complicated by the fact that, even though he is on a pressure-relief mattress, he is relatively immobile.  Turning him off these areas is not really an easy phenomenon.  He also has significant bladder spasms surrounding his suprapubic area and the area is constantly wet with urine.  We have been using Aquacel AG to this area.    He also has a fairly difficult wound on his right heel.  This has deteriorated.  The original wound appears stable.   However, he has a wound that now probes to bone just below this.  This is on the Achilles insertion area of the heel.      PHYSICAL EXAMINATION:   GENERAL APPEARANCE:  The patient is not in any distress.   SKIN:  INSPECTION:  The wounds on his right buttock are clean, granulated.  However, I just do not think they are easily getting enough pressure relief.  There is certainly not an option to turn this man on his sides.  I will see about wedges that could be placed under this area.  However, this may be the issue along with leaking from his suprapubic cath for which he has seen Urology.    The area on his right heel has deteriorated quite a bit.  He has a wound that probes to bone.  I have done a culture here.  This is going to need an x-ray, perhaps an MRI of the heel.  For now, I am going to wait for the culture result to come back before I put him on empiric antibiotics.     ASSESSMENT/PLAN:  Pressure ulcers on his buttock.  This is actually a surgical harvest site.  I will see if there is anything we can do to relieve the pressure on this area, although I cannot easily think of anything that could quickly be done.    Probably an infected wound on his right heel.   I have cultured this.  An x-ray will be ordered.  He has dorsalis pedis pulses that are brisk.  Nevertheless, the skin here is tight, flaking, certainly not an easy situation.

## 2013-12-25 ENCOUNTER — Other Ambulatory Visit: Payer: Self-pay | Admitting: *Deleted

## 2013-12-25 MED ORDER — OXYCODONE-ACETAMINOPHEN 7.5-325 MG PO TABS: ORAL_TABLET | ORAL | Status: DC

## 2013-12-25 NOTE — Telephone Encounter (Signed)
[facility] 

## 2014-01-01 ENCOUNTER — Non-Acute Institutional Stay (SKILLED_NURSING_FACILITY): Payer: PRIVATE HEALTH INSURANCE | Admitting: Internal Medicine

## 2014-01-01 DIAGNOSIS — A4902 Methicillin resistant Staphylococcus aureus infection, unspecified site: Secondary | ICD-10-CM

## 2014-01-01 DIAGNOSIS — L89309 Pressure ulcer of unspecified buttock, unspecified stage: Secondary | ICD-10-CM

## 2014-01-01 DIAGNOSIS — B029 Zoster without complications: Secondary | ICD-10-CM

## 2014-01-01 DIAGNOSIS — L89609 Pressure ulcer of unspecified heel, unspecified stage: Secondary | ICD-10-CM

## 2014-01-03 NOTE — Progress Notes (Signed)
Patient ID: [name], male   DOB: 1951-02-23, 63 y.o.   MRN: [id number]                  PROGRESS NOTE  DATE:  01/01/2014    FACILITY: [facility]    LEVEL OF CARE:   SNF   Acute Visit   CHIEF COMPLAINT:  Wound care review.     HISTORY OF PRESENT ILLNESS:   I have reviewed [name], who has had a difficult issue related to pressure ulcers.   I last saw him two weeks ago at which time he had an area on his right heel that subsequently became infected.  Culture of this from 12/18/2013 grew methicillin-resistant Staph aureus.  He was treated with Septra for two weeks and there has been really a nice improvement in this area.  There are still some open areas, but generally improved without abscess or infection.    His chronic pressure areas, which were initially harvest sites on his right buttock, are also gradually improving, although slowly.   This is difficult in the face of scar tissue.  Nevertheless, these are clean granulated wounds that are improving, as well.     PHYSICAL EXAMINATION:   SKIN:  INSPECTION:  The new problem here is some pustules that are on the nape of his neck.  This extends slightly into the left trapezius.  Some of these are already crusted over.  He describes burning pain and itching.  This is a bit of an unusual spot.  It is not clearly dermatomal that I am aware, but this very easily could be shingles.  The other thought I had was a folliculitis.      ASSESSMENT/PLAN:  Pressure ulcers, right heel.  The infection with MRSA appears to be better.  We will continue silver alginate to this area.    Pressure areas on the buttock.  This also appears to be improved.   Currently, I think we are using Aquacel AG.      ?Shingles in the upper cervical area.  This is not clearly dermatomal.  I am going to put him on Famvir 500  t.i.d. as well as doxycycline.  A dry dressing to this area.  I have encouraged him not to scratch.    CPT CODE: 97282

## 2014-01-08 ENCOUNTER — Non-Acute Institutional Stay (SKILLED_NURSING_FACILITY): Payer: PRIVATE HEALTH INSURANCE | Admitting: Internal Medicine

## 2014-01-08 DIAGNOSIS — B029 Zoster without complications: Secondary | ICD-10-CM

## 2014-01-13 NOTE — Progress Notes (Addendum)
Patient ID: [name], male   DOB: 1951-01-11, 63 y.o.   MRN: [id number]                  PROGRESS NOTE  DATE:  01/08/2014    FACILITY: [facility]    LEVEL OF CARE:   SNF   Acute Visit   CHIEF COMPLAINT:  Review of rash on the back of his neck.    HISTORY OF PRESENT ILLNESS:  [name] is a gentleman who I saw last week.  He had a vesicular/pustular rash on the back of his neck, extending from his hairline down to the top of his cervical column.  I had thought this might be viral, ?herpes, either zoster or simplex.  I put him on a combination of Famvir and doxycycline.  A culture of one of these pustules was negative.    PHYSICAL EXAMINATION:   SKIN:  INSPECTION:  Follow-up of this area shows no new blisters.  However, these have formed ulcerations which are necrotic-looking with a tan-colored eschar.  These does not appear to be any new blisters and the areas have not extended.    ASSESSMENT/PLAN:  Blistering area on the back of his neck, as described.  I wonder whether these were viral.   I have never seen friction blisters look so pustular.   If anymore of these occur, he will need either a biopsy and/or a trip to see the dermatologist.  I have talked to the wound care nurse about this.  I am not at all certain about the etiology here.

## 2014-01-15 ENCOUNTER — Non-Acute Institutional Stay (SKILLED_NURSING_FACILITY): Payer: PRIVATE HEALTH INSURANCE | Admitting: Internal Medicine

## 2014-01-15 DIAGNOSIS — L89309 Pressure ulcer of unspecified buttock, unspecified stage: Secondary | ICD-10-CM

## 2014-01-15 DIAGNOSIS — L89609 Pressure ulcer of unspecified heel, unspecified stage: Secondary | ICD-10-CM

## 2014-01-21 NOTE — Progress Notes (Signed)
Patient ID: [name], male   DOB: [birth year], 63 y.o.   MRN: [id number]                  PROGRESS NOTE  DATE:  01/15/2014    [facility]    LEVEL OF CARE:   SNF   Acute Visit   CHIEF COMPLAINT:  Follow up skin issues including neck rash, pressure ulcers on his buttocks, and right heel wound with MRSA.    HISTORY OF PRESENT ILLNESS:  I follow [name] frequently for a multitude of issues related to his skin.  The more chronic issues are skin harvest sites on his buttocks which are related to plastic surgery to close a wound on his coccyx, I believe almost 18-24 months ago.  The grafted site healed.  However, the areas on his buttocks have not.  As mentioned, these were skin harvest sites.    He also had a fairly significant ulcer on his right heel.  This cultured MRSA.  We treated him and this is actually making excellent progress.    Unfortunately, he has a new area in what is his right gluteal fold.  This looks like a pressure area.  The patient thinks he may have laid on something in his bed.    Finally, over the last two weeks he developed a pustular, blistering rash on the posterior aspect of his neck.  At first, I wondered whether this might be zoster although this was not clearly a single dermatome.    I gave him a course of Famvir for two weeks.  This has not helped.  In fact, he has developed new blisters.  The original areas have crusted into cutaneous ulcers.  He finds this painful and pruritic at the same time.    PHYSICAL EXAMINATION:   SKIN:  INSPECTION:  His neck area is absolutely no better.  There are now large, cutaneous ulcers here.  There are new blisters up into his hairline.      ASSESSMENT/PLAN:  Pustular, blistering rash on the posterior aspect of his neck.  I am not exactly sure what this is, although I wonder if this is a localized autoimmune blistering disease and I sense that one of these blisters is going to need to be biopsied.  I  will [facility].  Sometimes logistically, referring patients to Dermatology becomes a real challenge.  I could biopsy this myself at [facility] and/or give him a course of prednisone and doxycycline if, for one reason or another, I am unable to get adequate consultation.  For now, I am going to put Santyl to the actual cutaneous wounds that this has now left.   New pressure area over the right gluteal fold.  This has a necrotic eschar and is a long, linear wound which would tend to support the thought that he actually laid on something here.    Right heel.  This is considerably better.   Only small, open areas remain.    Original skin harvest sites on his gluteal areas.  I think these are in the need of some debridement, which I will try to do next week.  For now, continue the Aquacel AG.    Follow up in one week.

## 2014-01-25 ENCOUNTER — Other Ambulatory Visit: Payer: Self-pay | Admitting: *Deleted

## 2014-01-25 MED ORDER — OXYCODONE-ACETAMINOPHEN 7.5-325 MG PO TABS: ORAL_TABLET | ORAL | Status: DC

## 2014-01-25 NOTE — Telephone Encounter (Signed)
[facility] 

## 2014-02-04 ENCOUNTER — Other Ambulatory Visit: Payer: Self-pay | Admitting: *Deleted

## 2014-02-04 MED ORDER — OXYCODONE HCL 5 MG PO CAPS: ORAL_CAPSULE | ORAL | Status: DC

## 2014-02-04 NOTE — Telephone Encounter (Signed)
[facility] 

## 2014-02-25 ENCOUNTER — Other Ambulatory Visit: Payer: Self-pay | Admitting: *Deleted

## 2014-02-25 MED ORDER — OXYCODONE-ACETAMINOPHEN 7.5-325 MG PO TABS: ORAL_TABLET | ORAL | Status: DC

## 2014-02-25 NOTE — Telephone Encounter (Signed)
[facility] 

## 2014-02-27 IMAGING — CT CT HEAD W/O CM
1 series · 16 of 30 positions shown, 20 images · non-contrast
Comparison: None.

CLINICAL DATA: Altered mental status

CT HEAD WITHOUT CONTRAST
TECHNIQUE: Contiguous axial images were obtained from the base of
the skull through the vertex without contrast. Study was obtained
within 24 hours of patient arrival at the emergency department.

[Series 2: head routine 4.8 h37s · axial · 0.43mm/px · z∈[+1261,+1413]mm · 16 of 36 slices shown, 20 images]
[im 2/36  brain]
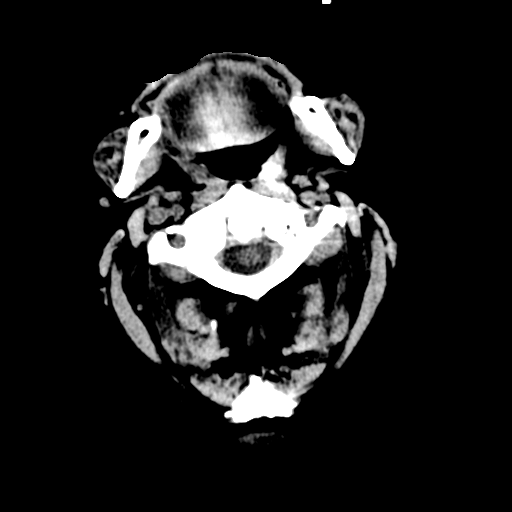
[im 2/36  bone]
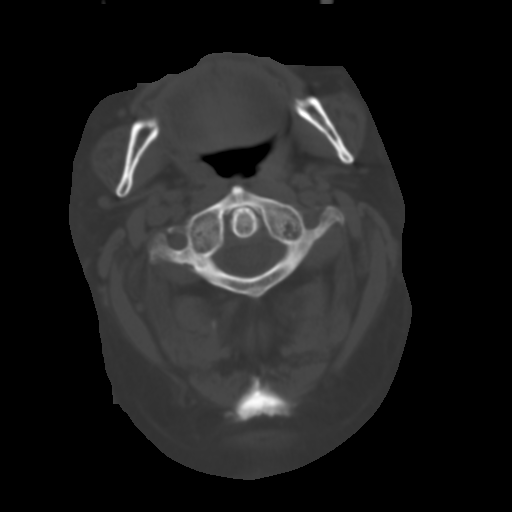
[im 4/36  brain]
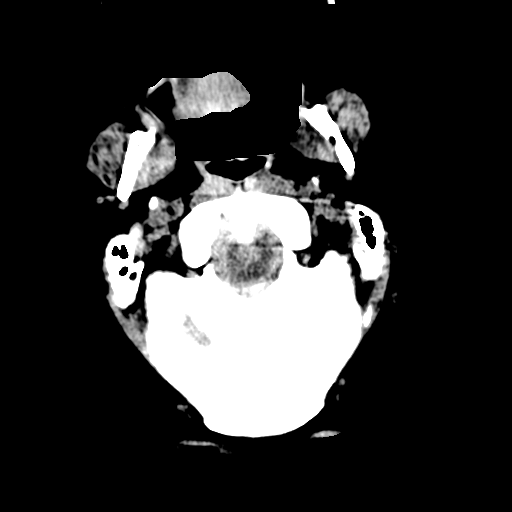
[im 7/36  brain]
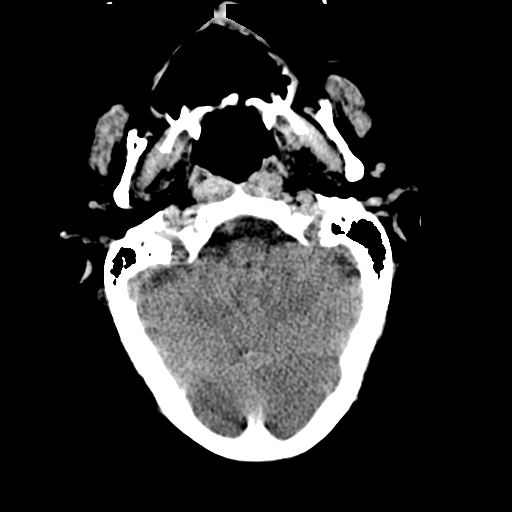
[im 9/36  brain]
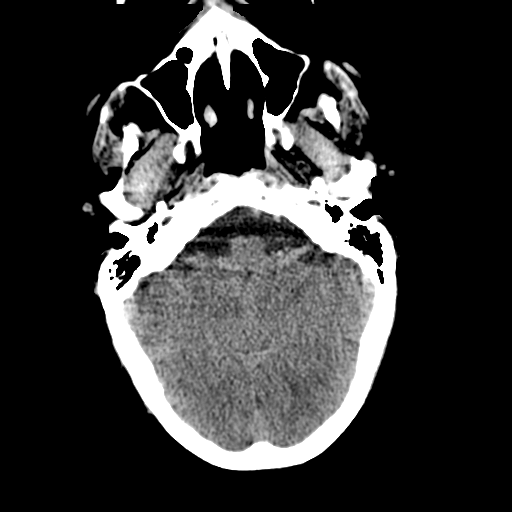
[im 10/36  brain]
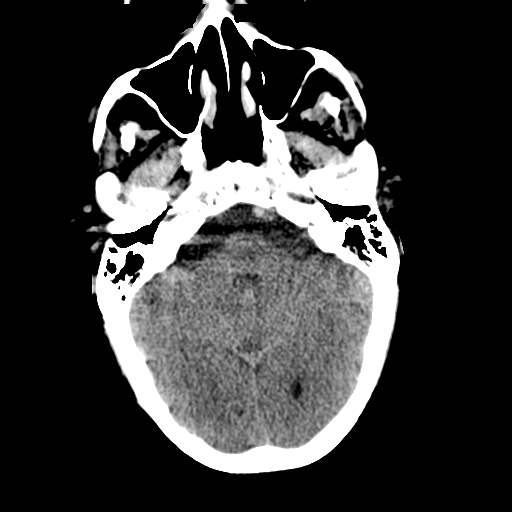
[im 10/36  bone]
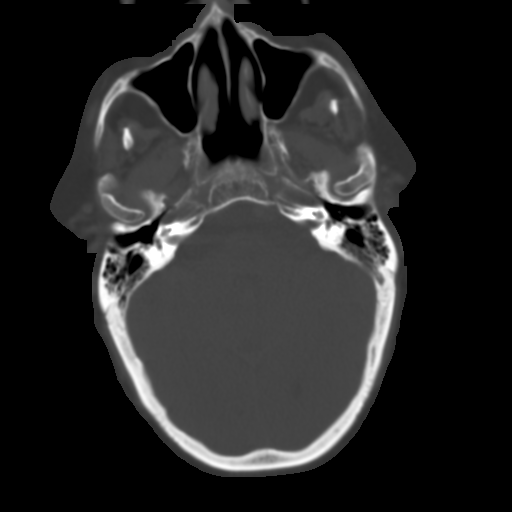
[im 13/36  brain]
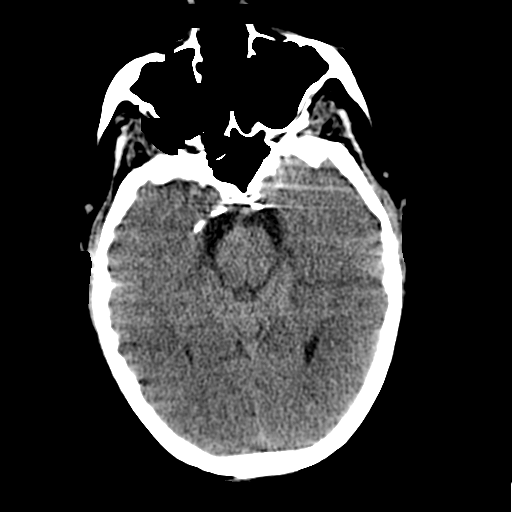
[im 15/36  brain]
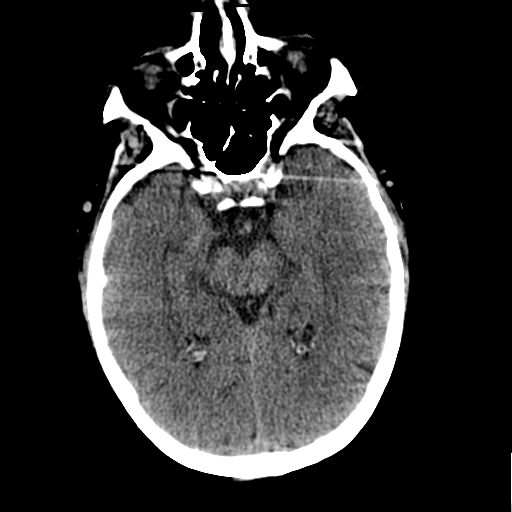
[im 17/36  brain]
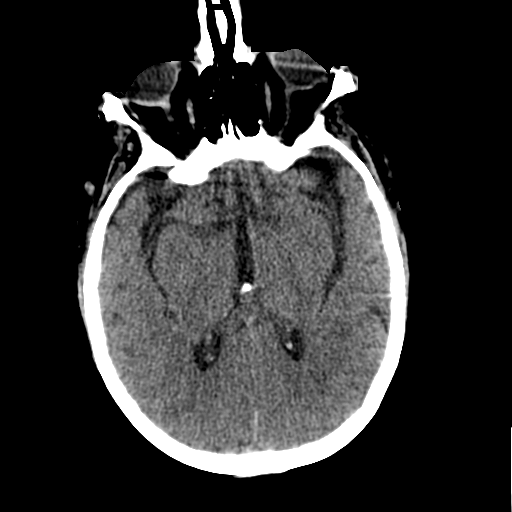
[im 19/36  brain]
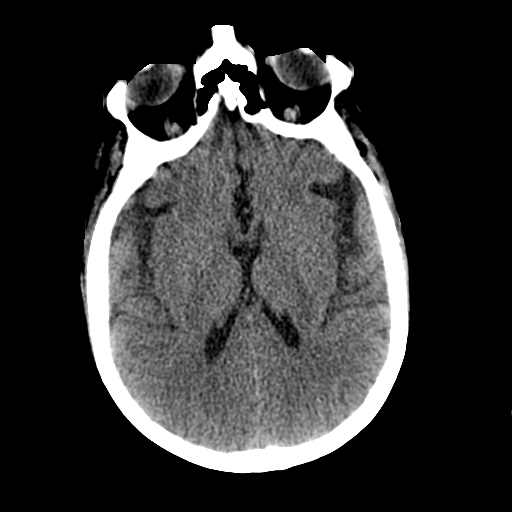
[im 19/36  bone]
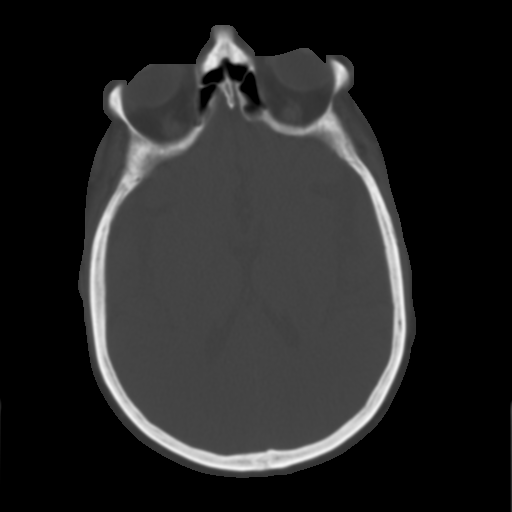
[im 21/36  brain]
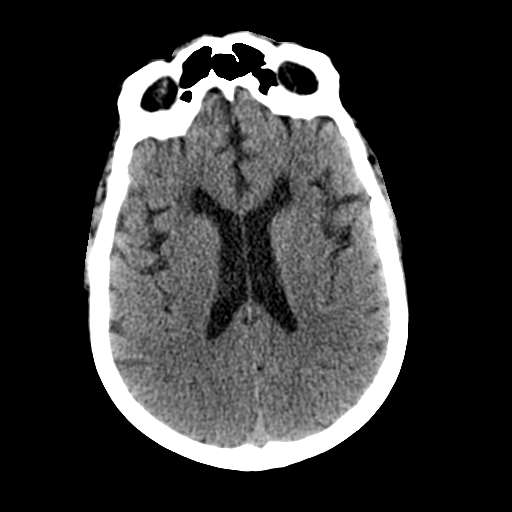
[im 23/36  brain]
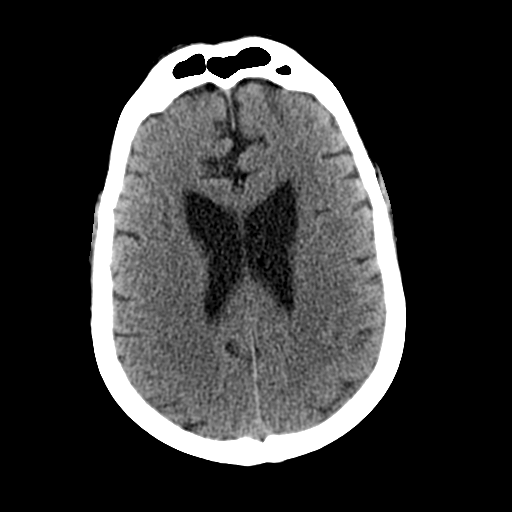
[im 26/36  brain]
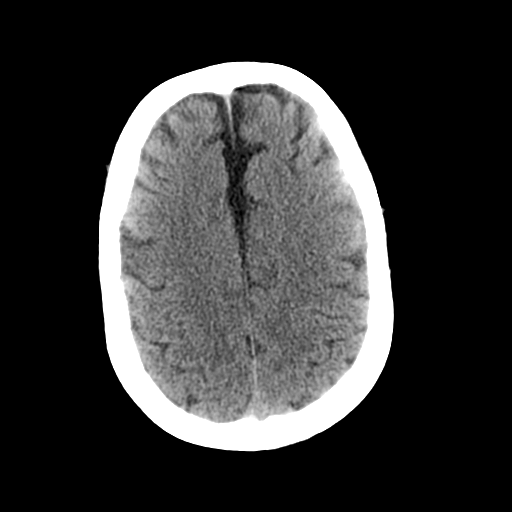
[im 27/36  brain]
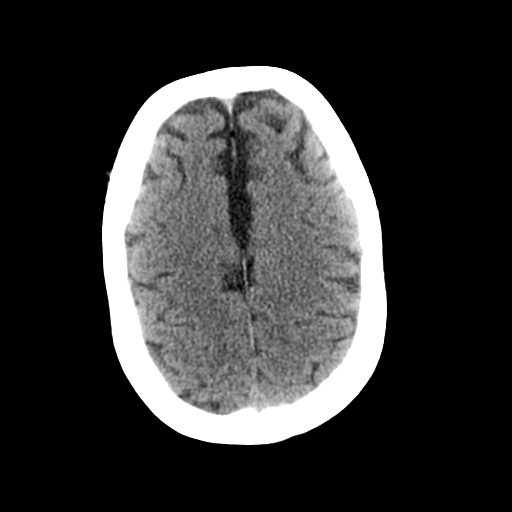
[im 27/36  bone]
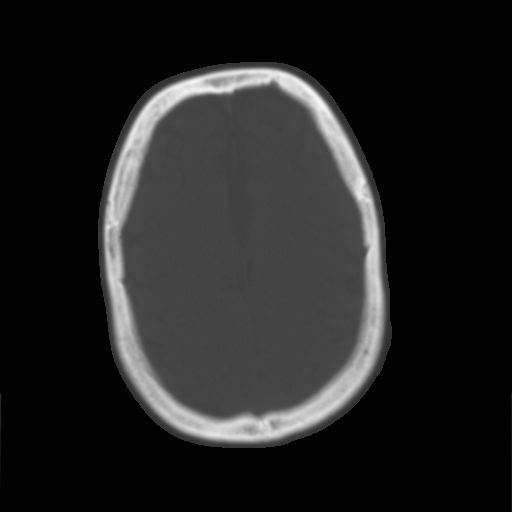
[im 29/36  brain]
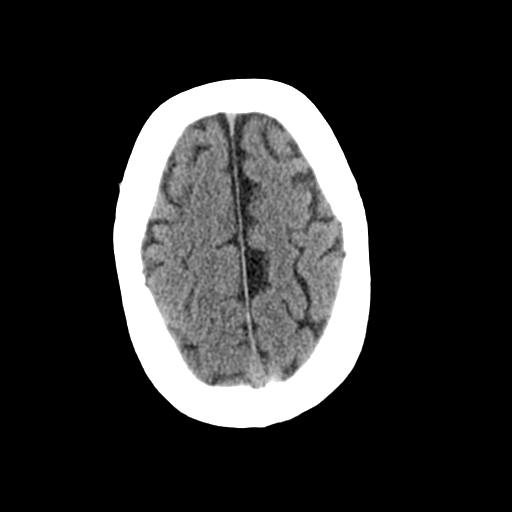
[im 32/36  brain]
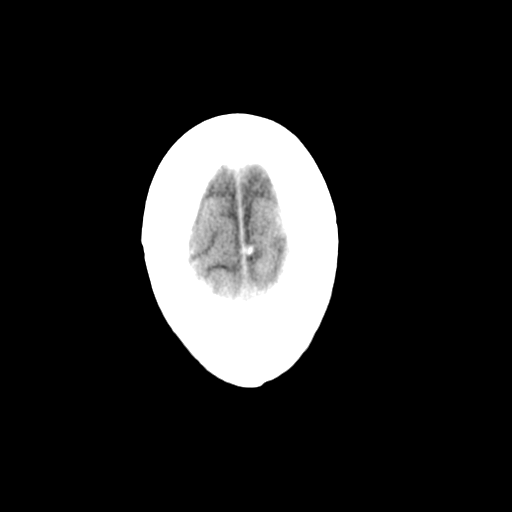
[im 34/36  brain]
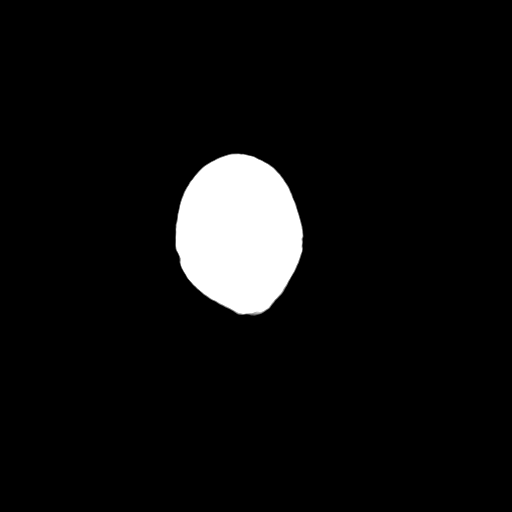

[16 of 30 positions shown; findings below may reference images not displayed]

FINDINGS: There is mild diffuse atrophy.  There is no mass,
hemorrhage, extra-axial fluid collection, or midline shift.  No
focal gray-white compartment lesions are identified.  No acute
infarct is appreciable on this study.  Bony calvarium appears
intact.  The mastoid air cells are clear.
IMPRESSION: Mild atrophy.  Study otherwise unremarkable.

## 2014-02-28 IMAGING — CR DG CHEST 1V PORT
1 series · 1 of 1 positions shown · non-contrast
Comparison: 07/12/2012

CLINICAL DATA: Acute respiratory distress.  Left pleural effusion.
Sepsis.

PORTABLE CHEST - 1 VIEW

[AP]
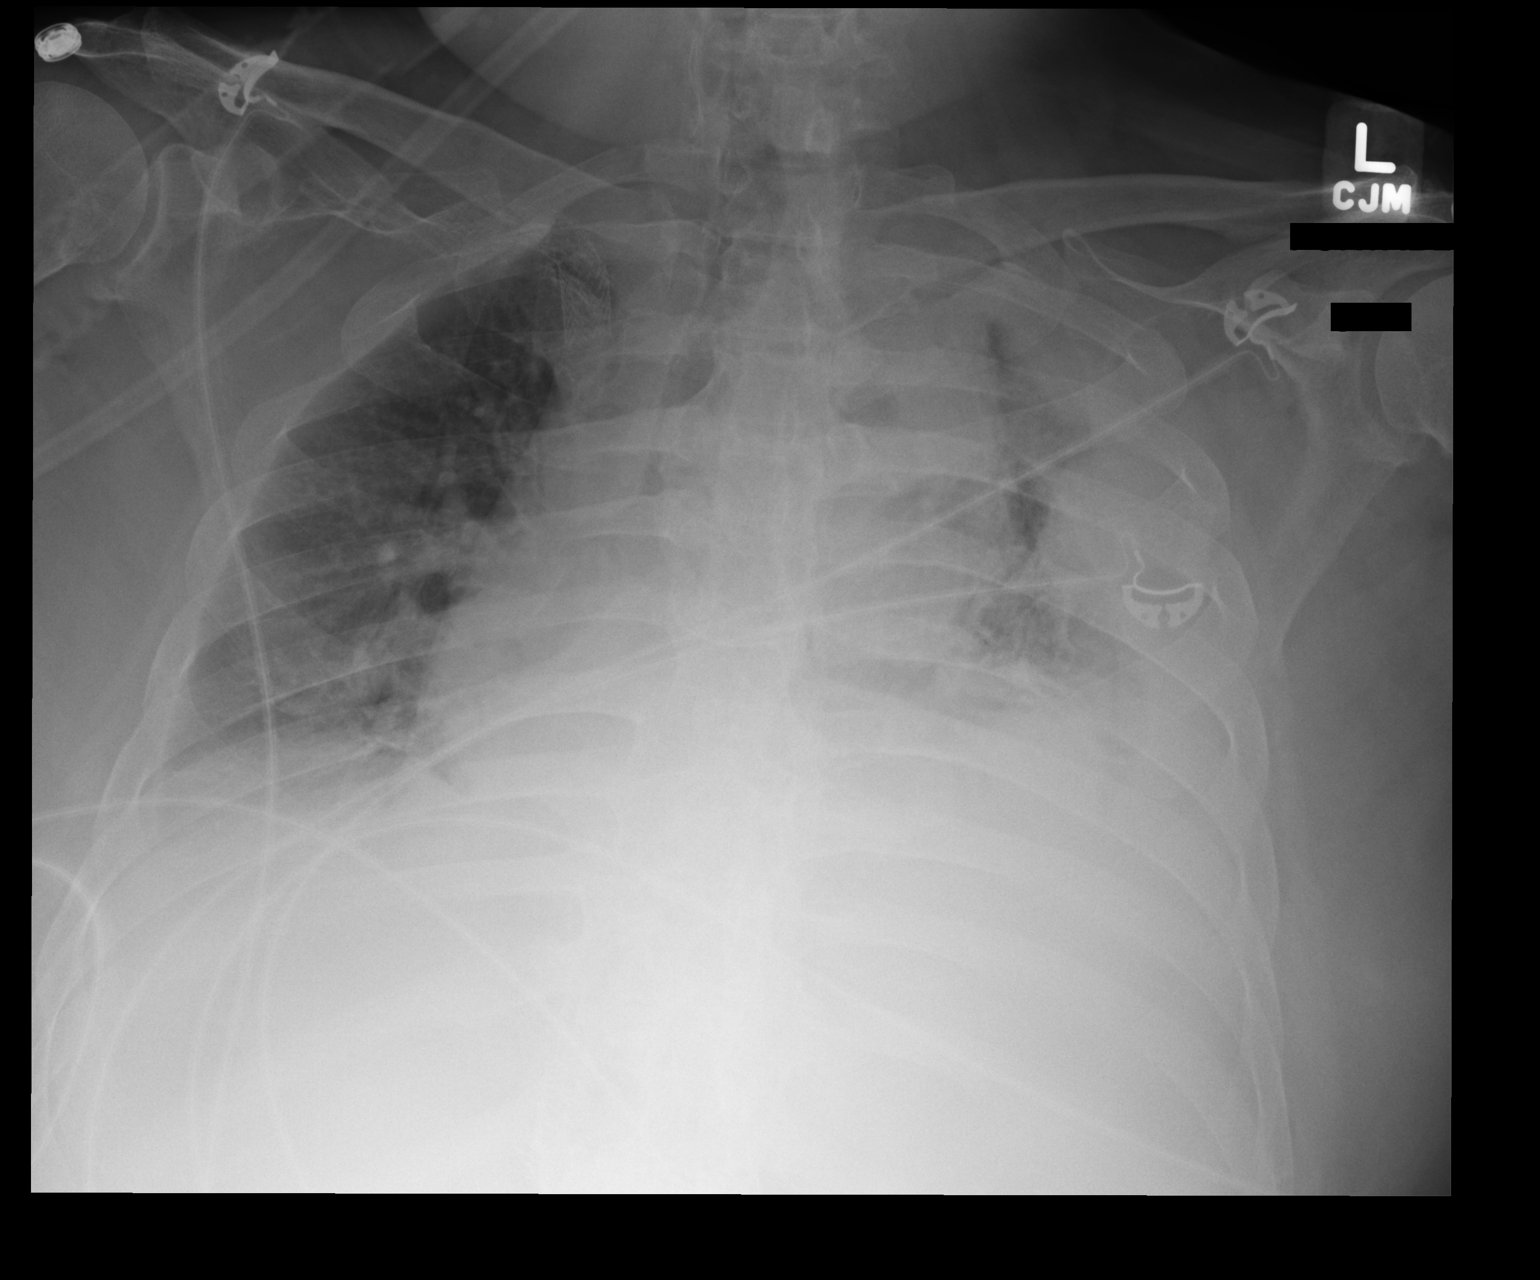

[1 of 1 positions shown; findings below may reference images not displayed]

FINDINGS: Large layering left pleural effusion is again seen as
well as left lung atelectasis.  Low lung volumes again seen however
right lung remains grossly clear.  Heart size remains stable.
Mediastinal widening and tracheal deviation to the right is
unchanged.
IMPRESSION: 1.  Large layering left pleural effusion on left lung atelectasis,
without significant change.
2.  Stable mediastinal widening with tracheal deviation to the
right.

## 2014-03-03 ENCOUNTER — Non-Acute Institutional Stay (SKILLED_NURSING_FACILITY): Payer: PRIVATE HEALTH INSURANCE | Admitting: Internal Medicine

## 2014-03-03 DIAGNOSIS — L89309 Pressure ulcer of unspecified buttock, unspecified stage: Secondary | ICD-10-CM

## 2014-03-03 DIAGNOSIS — L89609 Pressure ulcer of unspecified heel, unspecified stage: Secondary | ICD-10-CM

## 2014-03-05 IMAGING — US IR US GUIDE VASC ACCESS RIGHT
1 series · 2 of 2 positions shown · non-contrast
Comparison: none

PICC PLACEMENT WITH ULTRASOUND AND FLUOROSCOPIC  GUIDANCE
CLINICAL HISTORY: Sepsis of uncertain etiology, pneumonia suspected
culprit.  The patient will need IV antibiotics and a PICC is
requested.

[Series 1: ir us guide vasc access right · 2 of 2 slices shown]
[im 1/2]
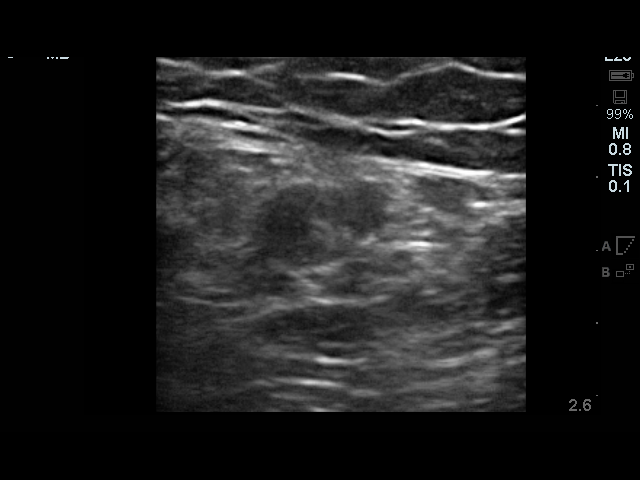
[im 2/2]
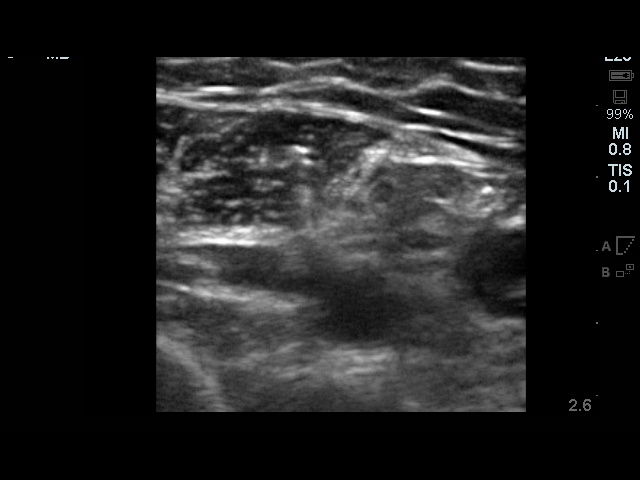

[2 of 2 positions shown; findings below may reference images not displayed]

Fluoroscopy Time: 3.8 minutes.

Procedure:

The right arm was prepped with chlorhexidine, draped in the usual
sterile fashion using maximum barrier technique (cap and mask,
sterile gown, sterile gloves, large sterile sheet, hand hygiene and
cutaneous antiseptic).  Local anesthesia was attained by
infiltration with 1% lidocaine.

Ultrasound demonstrated patency of the right basilic vein, and this
was documented with an image.  Under real-time ultrasound guidance,
this vein was accessed with a 21 gauge micropuncture needle and
image documentation was performed. Unfortunately, the wire could
not be advanced one several centimeters into the vein raising
concern for a possible stenosis versus vasospasm.  Needle was
removed and hemostasis attained by manual pressure.  The arm was
again evaluated with ultrasound in this time the right brachial
vein was selected.  The vessel was punctured under direct
sonographic guidance with a 21-gauge micropuncture needle.  An
image was documented.

The needle was exchanged over a guidewire for a peel-away sheath
through which a 40 cm 5 French dual lumen power injectable PICC was
advanced, and positioned with its tip at the lower SVC/right atrial
junction. Initially, the catheter would not go past the clavicle.
A limited venogram was performed which demonstrated previously seen
right subclavian vein venous stent.  The stent is widely patent.
The catheter was hanging out on the interstices.  Therefore, a wire
was used to navigate the PICC catheter through the center of the
stent without passing through the interstices.  Fluoroscopy during
the procedure and fluoro spot radiograph confirms appropriate
catheter position.  The catheter was flushed, secured to the skin
with Prolene sutures, and covered with a sterile dressing.

Complications:  None.  The patient tolerated the procedure well.
IMPRESSION: 1.  Successful placement of a right arm PICC with sonographic and
fluoroscopic guidance.  The catheter is ready for use.

2.  Incidentally, the patient has a patent right subclavian venous
stent which is the likely etiology of the difficulty in passing the
PICC experienced by the IV team.  If the patient were to require a
PICC in the future, consider using left arm approach.

[REDACTED]

## 2014-03-05 NOTE — Progress Notes (Signed)
Patient ID: Jani GravelHerman M Hoelting, male   DOB: 13-Jun-1951, 63 y.o.   MRN: [id number] P                  PROGRESS NOTE  DATE:  03/05/2014   [facility]    LEVEL OF CARE:   SNF   Acute Visit   CHIEF COMPLAINT, pressure ulcers on his buttocks, and right heel wound with MRSA.    HISTORY OF PRESENT ILLNESS:  I follow [name] frequently for a multitude of issues related to his skin.  The more chronic issues are skin harvest sites on his buttocks which are related to plastic surgery to close a wound on his coccyx, I believe almost 24 months ago.  The grafted site healed.  However, the areas on his buttocks have not.  As mentioned, these were skin harvest sites.    He also had a fairly significant ulcer on his right heel.  This cultured MRSA.  We treated him and this is actually making excellent progress.    Unfortunately, he has a new area in what is his right gluteal fold.  This looks like a pressure area.  The patient thinks he may have laid on something in his bed.    PHYSICAL EXAMINATION:   SKIN:  INSPECTION:  Right heel has resolved. Left buttock wounds were debrided of a scant surface escar. Rt. Gluteal wound is unchanged    ASSESSMENT/PLAN:  Right heel.  This is resolved    Original skin harvest sites on his gluteal areas.  Wounds debrided. Change dressing to Santyl for appox 2 weeks

## 2014-03-25 ENCOUNTER — Other Ambulatory Visit: Payer: Self-pay

## 2014-03-25 MED ORDER — OXYCODONE-ACETAMINOPHEN 7.5-325 MG PO TABS: ORAL_TABLET | ORAL | Status: DC

## 2014-03-25 NOTE — Telephone Encounter (Signed)
Rx faxed to [facility] @ [phone], phone number [phone]  

## 2014-04-16 ENCOUNTER — Non-Acute Institutional Stay (SKILLED_NURSING_FACILITY): Payer: PRIVATE HEALTH INSURANCE | Admitting: Internal Medicine

## 2014-04-16 DIAGNOSIS — L89309 Pressure ulcer of unspecified buttock, unspecified stage: Secondary | ICD-10-CM

## 2014-04-16 NOTE — Progress Notes (Signed)
Patient ID: [name], male   DOB: 02-24-1951, 63 y.o.   MRN: [id number]                    PROGRESS NOTE  DATE:  04/16/2014  [facility]    LEVEL OF CARE:   SNF   Acute Visit   CHIEF COMPLAINT, pressure ulcers on his buttocks, and right heel wound with MRSA.    HISTORY OF PRESENT ILLNESS:  I follow [name] frequently for a multitude of issues related to his skin.  The more chronic issues are skin harvest sites on his buttocks which are related to plastic surgery to close a wound on his coccyx, I believe almost 24 months ago.  The grafted site healed.  However, the areas on his buttocks have not.  As mentioned, these were skin harvest sites.    He also had a fairly significant ulcer on his right heel. This has resolved   PHYSICAL EXAMINATION:   SKIN:  INSPECTION:  Right heel has resolved. Left buttock wounds were debrided of a scant surface escar. Rt. Gluteal wound is unchanged  The only wound remaining in his lower extremities is over the left medial malleolus   ASSESSMENT/PLAN:  Wound over the left medial malleolus; this is being addressed with a silver alginate  Original skin harvest sites on his gluteal areas. We'll continue Santyl with a calcium alginate. These wounds may be very slowly closing over however it is difficult to see them really closing with a significant amount of scar tissue and the fact the patient really cannot be positioned off these areas appear. He has requested being able to get up in his wheelchair. He has an offloading cushion. In view of trying them give him a better quality to his day I have given him permission to do this although we'll need to keep and I am the wounds

## 2014-04-22 ENCOUNTER — Other Ambulatory Visit: Payer: Self-pay | Admitting: *Deleted

## 2014-04-22 MED ORDER — ZOLPIDEM TARTRATE 5 MG PO TABS: ORAL_TABLET | ORAL | Status: DC

## 2014-04-22 NOTE — Telephone Encounter (Signed)
[facility] 

## 2014-04-25 ENCOUNTER — Other Ambulatory Visit: Payer: Self-pay | Admitting: *Deleted

## 2014-04-25 MED ORDER — OXYCODONE-ACETAMINOPHEN 7.5-325 MG PO TABS: ORAL_TABLET | ORAL | Status: DC

## 2014-04-25 NOTE — Telephone Encounter (Signed)
[facility] 

## 2014-05-14 ENCOUNTER — Non-Acute Institutional Stay (SKILLED_NURSING_FACILITY): Payer: PRIVATE HEALTH INSURANCE | Admitting: Internal Medicine

## 2014-05-14 DIAGNOSIS — L89302 Pressure ulcer of unspecified buttock, stage 2: Secondary | ICD-10-CM

## 2014-05-14 NOTE — Progress Notes (Signed)
Patient ID: Jani GravelHerman M Debois, male   DOB: 08/12/1950, 63 y.o.   MRN: [id number]                    PROGRESS NOTE  DATE:  05/14/2014  [facility]    LEVEL OF CARE:   SNF   Acute Visit   CHIEF COMPLAINT, pressure ulcers on his buttocks.   HISTORY OF PRESENT ILLNESS:  I follow [name] frequently for a multitude of issues related to his skin.  The more chronic issues are skin harvest sites on his buttocks which are related to plastic surgery to close a wound on his coccyx, I believe almost 24 months ago.  The grafted site healed.  However, the areas on his buttocks have not.  As mentioned, these were skin harvest sites.    He is also complaining about nasal congestion and cough for. He is not running a fever. This probably reflects a viral illness   PHYSICAL EXAMINATION:  O2 sat 95% respirations 18 and unlabored SKIN:  Respiratory clear entry bilaterally INSPECTION:  The areas on predominantly his right buttock. These actually look considerably better. One of them even has a [place] of epithelialization. A smaller area has some hyper granulation which may need to be debrided. We are using Santyl with an alginate here I think we should continue this. The patient is actually spending much more time off his buttocks which really helps   ASSESSMENT/PLAN:  Upper respiratory tract infection I will leave him when necessary medication   Original skin harvest sites on his gluteal areas. We'll continue Santyl with a calcium alginate. These wounds may be very slowly closing over however it is difficult to see them really closing with a significant amount of scar tissue and the fact the patient really cannot be positioned off these areas appear. He has requested being able to get up in his wheelchair. He has an offloading cushion. In view of trying them give him a better quality to his day I have given him permission to do this although we'll need to keep and I am the wounds

## 2014-05-28 ENCOUNTER — Other Ambulatory Visit: Payer: Self-pay | Admitting: *Deleted

## 2014-05-28 MED ORDER — OXYCODONE-ACETAMINOPHEN 7.5-325 MG PO TABS: ORAL_TABLET | ORAL | Status: DC

## 2014-05-28 NOTE — Telephone Encounter (Signed)
[facility] 

## 2014-06-18 ENCOUNTER — Non-Acute Institutional Stay (SKILLED_NURSING_FACILITY): Payer: PRIVATE HEALTH INSURANCE | Admitting: Internal Medicine

## 2014-06-18 DIAGNOSIS — L89302 Pressure ulcer of unspecified buttock, stage 2: Secondary | ICD-10-CM

## 2014-06-18 DIAGNOSIS — D509 Iron deficiency anemia, unspecified: Secondary | ICD-10-CM

## 2014-06-20 NOTE — Progress Notes (Addendum)
Patient ID: [name], male   DOB: 07-31-1951, 63 y.o.   MRN: [id number]              PROGRESS NOTE  DATE:  06/18/2014      FACILITY[facility]    LEVEL OF CARE:   SNF   Routine Visit   CHIEF COMPLAINT:  Review of general medical issues, [employer] visit.         HISTORY OF PRESENT ILLNESS:  This is a patient who I believe has been in the building since December 2006.    He has chronic T11 paraplegia.    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  He went to have these surgically closed by [facility] in [place].  The two skin harvest sites on his right buttock are really the remaining wounds here.  The original wound paradoxically closed.    Other medical issues include:    Chronically leaking suprapubic catheter.  He has been to Urology.  There is nothing more they can do to this.    He does have a history of chronic anemia and has had to be transfused in the past, although a lot of this was felt to be drainage from his wounds.  He had a complete GI work-up, including colonoscopy through his ostomy site that did not show any particularly worrisome lesions.  His last hemoglobin checked was 9 on 05/22/2014.  His MCV was normal, MCHC slightly reduced.    SOCIAL HISTORY:   CODE STATUS:  The patient is a Full Code.    REVIEW OF SYSTEMS:   CHEST/RESPIRATORY:  No cough.   CARDIAC:   No chest pain.    GI:  Ostomy moving well with no issues.   GU:  Chronic leakage around the catheter site.  The last recommendation I saw was to use Ditropan for these spasms.   MUSCULOSKELETAL:  He is complaining of mid to low back pain, as well as some numbness and pain in his right hand in his thumb and forefinger.    PHYSICAL EXAMINATION:   VITAL SIGNS:   TEMPERATURE:  98.5.   PULSE:  87.   RESPIRATIONS:  19.   BLOOD PRESSURE:  128/72.   WEIGHT:  224 pounds.   O2 SATURATIONS:  95%, not currently on oxygen.  He uses oxygen at night.     CHEST/RESPIRATORY:  Clear air entry bilaterally.   CARDIOVASCULAR:  CARDIAC:   Heart sounds are normal.  There are no murmurs.   GASTROINTESTINAL:  ABDOMEN:   Soft.  There is no tenderness.   MUSCULOSKELETAL:   BACK:   No focal back tenderness.   EXTREMITIES:   RIGHT UPPER EXTREMITY:  He has no arthritis in the right hand.  I wonder if this could be because he attempts to hold himself against his side rails so tightly to keep his wounds off the bed.   SKIN:  INSPECTION:  The areas on the right buttock are actually harvest skin sites.  There is erythema around there.  I suspect these are not going to heal due to longstanding pressure.    ASSESSMENT/PLAN:  Chronic pressure ulcers, although the sites that are currently open were actually surgical harvest sites.  I would look at this as a palliative situation.  Continue with calcium alginate.    Chronic iron deficiency anemia.    He has had a history of lower extremity wounds which are multifactorial.  Currently, he does not have  any.  The wound care team keeps a Kerlix/Coban wrap on him.

## 2014-07-03 ENCOUNTER — Other Ambulatory Visit: Payer: Self-pay

## 2014-07-03 MED ORDER — OXYCODONE-ACETAMINOPHEN 7.5-325 MG PO TABS
ORAL_TABLET | ORAL | Status: DC
Start: 2014-07-03 — End: 2014-09-02

## 2014-07-03 NOTE — Telephone Encounter (Signed)
Rx faxed to [facility] @ [phone], phone number [phone]  

## 2014-07-17 ENCOUNTER — Other Ambulatory Visit: Payer: Self-pay | Admitting: *Deleted

## 2014-07-17 MED ORDER — OXYCODONE HCL 5 MG PO CAPS: ORAL_CAPSULE | ORAL | Status: DC

## 2014-07-17 NOTE — Telephone Encounter (Signed)
[facility] 

## 2014-08-13 ENCOUNTER — Encounter: Payer: Self-pay | Admitting: Internal Medicine

## 2014-08-13 ENCOUNTER — Ambulatory Visit (INDEPENDENT_AMBULATORY_CARE_PROVIDER_SITE_OTHER): Payer: Medicare Other | Admitting: Internal Medicine

## 2014-08-13 ENCOUNTER — Non-Acute Institutional Stay (SKILLED_NURSING_FACILITY): Payer: Medicare Other | Admitting: Internal Medicine

## 2014-08-13 ENCOUNTER — Other Ambulatory Visit (INDEPENDENT_AMBULATORY_CARE_PROVIDER_SITE_OTHER): Payer: Medicare Other

## 2014-08-13 VITALS — BP 112/64 | HR 84

## 2014-08-13 DIAGNOSIS — D6489 Other specified anemias: Secondary | ICD-10-CM

## 2014-08-13 DIAGNOSIS — R1013 Epigastric pain: Secondary | ICD-10-CM

## 2014-08-13 DIAGNOSIS — Z1211 Encounter for screening for malignant neoplasm of colon: Secondary | ICD-10-CM

## 2014-08-13 DIAGNOSIS — D649 Anemia, unspecified: Secondary | ICD-10-CM | POA: Diagnosis not present

## 2014-08-13 DIAGNOSIS — D509 Iron deficiency anemia, unspecified: Secondary | ICD-10-CM

## 2014-08-13 DIAGNOSIS — L89302 Pressure ulcer of unspecified buttock, stage 2: Secondary | ICD-10-CM

## 2014-08-13 LAB — COMPREHENSIVE METABOLIC PANEL
ALBUMIN: 3.7 g/dL (ref 3.5–5.2)
ALK PHOS: 95 U/L (ref 39–117)
ALT: 18 U/L (ref 0–53)
AST: 16 U/L (ref 0–37)
BUN: 28 mg/dL — ABNORMAL HIGH (ref 6–23)
CALCIUM: 9 mg/dL (ref 8.4–10.5)
CO2: 31 mEq/L (ref 19–32)
Chloride: 98 mEq/L (ref 96–112)
Creatinine, Ser: 1 mg/dL (ref 0.4–1.5)
GFR: 82.8 mL/min (ref >60.00)
Glucose, Bld: 104 mg/dL — ABNORMAL HIGH (ref 70–99)
POTASSIUM: 3.7 meq/L (ref 3.5–5.1)
Sodium: 137 mEq/L (ref 135–145)
Total Bilirubin: 0.6 mg/dL (ref 0.2–1.2)
Total Protein: 9.3 g/dL — ABNORMAL HIGH (ref 6.0–8.3)

## 2014-08-13 LAB — CBC WITH DIFFERENTIAL/PLATELET
Basophils Absolute: 0.1 10*3/uL (ref 0.0–0.1)
Basophils Relative: 0.7 % (ref 0.0–3.0)
EOS ABS: 0.2 10*3/uL (ref 0.0–0.7)
Eosinophils Relative: 2.5 % (ref 0.0–5.0)
HEMATOCRIT: 35.8 % — AB (ref 39.0–52.0)
HEMOGLOBIN: 11.1 g/dL — AB (ref 13.0–17.0)
LYMPHS ABS: 1.6 10*3/uL (ref 0.7–4.0)
Lymphocytes Relative: 16.9 % (ref 12.0–46.0)
MCHC: 31 g/dL (ref 30.0–36.0)
MCV: 76.2 fl — ABNORMAL LOW (ref 78.0–100.0)
MONOS PCT: 8.1 % (ref 3.0–12.0)
Monocytes Absolute: 0.8 10*3/uL (ref 0.1–1.0)
NEUTROS ABS: 6.8 10*3/uL (ref 1.4–7.7)
Neutrophils Relative %: 71.8 % (ref 43.0–77.0)
Platelets: 367 10*3/uL (ref 150.0–400.0)
RBC: 4.7 Mil/uL (ref 4.22–5.81)
RDW: 19.4 % — AB (ref 11.5–15.5)
WBC: 9.5 10*3/uL (ref 4.0–10.5)

## 2014-08-13 LAB — IBC PANEL
IRON: 31 ug/dL — AB (ref 42–165)
SATURATION RATIOS: 9.5 % — AB (ref 20.0–50.0)
Transferrin: 231.9 mg/dL (ref 212.0–360.0)

## 2014-08-13 LAB — FERRITIN: FERRITIN: 31.5 ng/mL (ref 22.0–322.0)

## 2014-08-13 MED ORDER — PANTOPRAZOLE SODIUM 40 MG PO TBEC: 40.0000 mg | DELAYED_RELEASE_TABLET | Freq: Every day | ORAL | Status: DC

## 2014-08-13 NOTE — Progress Notes (Signed)
Patient ID: [name], male   DOB: 1950-12-02, 64 y.o.   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 Leanord Hawking to evaluate epigastric pain. He is here with to EMS workers on a stretcher. The consultation request entered into the chart mentions swallowing issues however the patient denies trouble swallowing. He also denies odynophagia. He does report a history of heartburn and he is taking Zantac 150 mg. It is unclear whether this is once or twice daily. He has a history of iron deficiency anemia but reports that iron makes him very constipated. He is now off oral iron. He is taking Senokot and having regular bowel movements. All of his bowel movements are via his ostomy and he has no discharge from his rectum. He reports about 10 days of epigastric abdominal pain. This is burning type pain and worse with eating. It is not constant. No definite alleviating factors other than Maalox. He feels it started after eating chili from [facility] and using hot sauce. When he takes his medications the pain seems to be worse and he also has nausea but no vomiting. He denies dysphagia and reports he tries to take smaller bites and chew his food very well. He lives in [facility] living facility.  His note from primary care from 06/18/2014 notes hemoglobin was 9 on 05/22/2014 with normal MCV and slightly reduced MCHC.  He had upper endoscopy and colonoscopy via his ostomy performed by Dr. Evette Cristal in October 2014. Colonoscopy was normal and it is noted that his rectum was sewn shut. Upper endoscopy revealed normal esophagus, mild erythematous mucosa in the stomach but was otherwise normal with normal appearing  duodenum. Nothing found to explain anemia.  Past Medical History  Diagnosis Date  . Coronary artery disease   . CHF (congestive heart failure)   . Paraplegia   . GERD (gastroesophageal reflux disease)   . Seizure   . Anemia   . Anxiety   . Hypertension   . Obesity   . Pneumonia   . Urinary tract infection   . Blood clotting disorder   . Irritable bowel syndrome     Past Surgical History  Procedure Laterality Date  . Colonostomy    . Colon surgery      colonostomy  . Esophagogastroduodenoscopy (egd) with propofol N/A 05/25/2013    Procedure: ESOPHAGOGASTRODUODENOSCOPY (EGD) WITH PROPOFOL;  Surgeon: Graylin Shiver, MD;  Location: [facility];  Service: Endoscopy;  Laterality: N/A;  . Colonoscopy with propofol N/A 05/25/2013    Procedure: COLONOSCOPY WITH PROPOFOL;  Surgeon: Graylin Shiver, MD;  Location: [facility];  Service: Endoscopy;  Laterality: N/A;    Outpatient Prescriptions Prior to Visit  Medication Sig Dispense Refill  . acetaZOLAMIDE (DIAMOX) 250 MG tablet Take 500 mg by mouth daily.    Marland Kitchen aspirin EC 81 MG tablet Take 81 mg by mouth daily.    . divalproex (DEPAKOTE) 500 MG DR tablet Take 500 mg by mouth 2 (two) times daily.    Marland Kitchen docusate sodium (COLACE) 100 MG capsule Take 200 mg by mouth daily.    . ferrous fumarate (HEMOCYTE - 106 MG FE) 325 (106 FE) MG TABS tablet Take 1 tablet by mouth 3 (  three) times daily.    Marland Kitchen. FLUoxetine (PROZAC) 20 MG capsule Take 60 mg by mouth 2 (two) times daily.    . fluticasone (FLONASE) 50 MCG/ACT nasal spray Place 2 sprays into the nose at bedtime. Use for 1 month.  Final dose due 08/12/2012.    . furosemide (LASIX) 20 MG tablet Take 60 mg by mouth 2 (two) times daily.    Marland Kitchen. guaiFENesin (MUCINEX) 600 MG 12 hr tablet Take 600 mg by mouth 2 (two) times daily. Take for 10 days.  Last dose due on the morning of 07/20/2012.    . hydroxypropyl methylcellulose (ISOPTO TEARS) 2.5 % ophthalmic solution Place 1 drop into both eyes every 12 (twelve)  hours.    . hydrOXYzine (ATARAX/VISTARIL) 25 MG tablet Take 25 mg by mouth every 6 (six) hours as needed for itching.    . iron polysaccharides (NIFEREX) 150 MG capsule Take 150 mg by mouth daily.    Marland Kitchen. latanoprost (XALATAN) 0.005 % ophthalmic solution Place 1 drop into both eyes at bedtime.    . lidocaine (LIDODERM) 5 % Place 1 patch onto the skin daily. Remove & Discard patch within 12 hours or as directed by MD    . metoprolol succinate (TOPROL-XL) 25 MG 24 hr tablet Take 12.5 mg by mouth every morning.    . Multiple Vitamins-Minerals (CERTA-VITE PO) Take 1 tablet by mouth daily.    Marland Kitchen. oxycodone (OXY-IR) 5 MG capsule Take one tablet by mouth every 6 hours as needed for breakthrough pain 120 capsule 0  . oxyCODONE-acetaminophen (PERCOCET) 7.5-325 MG per tablet Take one tablet by mouth twice daily for pain DO NOT EXCEED 4 GM OF TYLENOL IN 24 HOURS 60 tablet 0  . potassium chloride (K-DUR,KLOR-CON) 10 MEQ tablet Take 10 mEq by mouth daily.    Marland Kitchen. saccharomyces boulardii (FLORASTOR) 250 MG capsule Take 250 mg by mouth 2 (two) times daily.    . sennosides-docusate sodium (SENOKOT-S) 8.6-50 MG tablet Take 1 tablet by mouth daily.    . vitamin C (ASCORBIC ACID) 500 MG tablet Take 500 mg by mouth daily.    . Vitamin D, Ergocalciferol, (DRISDOL) 50000 UNITS CAPS capsule Take 50,000 Units by mouth every 30 (thirty) days.    Marland Kitchen. zolpidem (AMBIEN) 5 MG tablet Take one tablet by mouth every night at bedtime as needed for rest 30 tablet 5  . oxybutynin (DITROPAN-XL) 10 MG 24 hr tablet Take 20 mg by mouth daily.    Marland Kitchen. venlafaxine XR (EFFEXOR-XR) 150 MG 24 hr capsule Take 150 mg by mouth every morning.     No facility-administered medications prior to visit.    Allergies  Allergen Reactions  . Ciprofloxacin Itching  . Minocycline Hcl Itching    Family History  Problem Relation Age of Onset  . Heart failure Mother   . Heart failure Father   . Colon cancer Neg Hx   . Colon polyps Neg Hx   . Kidney disease  Neg Hx   . Gallbladder disease Neg Hx   . Brain cancer Brother     Had a tumor    History  Substance Use Topics  . Smoking status: Never Smoker   . Smokeless tobacco: Never Used  . Alcohol Use: No    ROS: As per history of present illness, otherwise negative  BP 112/64 mmHg  Pulse 84  Ht  Constitutional: Well-developed and well-nourished. No distress on EMS stretcher HEENT: Normocephalic and atraumatic. Oropharynx is clear and moist. No oropharyngeal exudate. Conjunctivae are  normal.  No scleral icterus. Neck: Neck supple. Trachea midline. Cardiovascular: Normal rate, regular rhythm and intact distal pulses.  Pulmonary/chest: Effort normal and breath sounds normal anteriorly. No wheezing, rales or rhonchi. Abdominal: Soft, mild epigastric tenderness without rebound or guarding, nondistended. Bowel sounds active throughout. Obese. Left lower quadrant colostomy draining greenish brown formed stool Extremities: no clubbing, cyanosis, or edema Lymphadenopathy: No cervical adenopathy noted. Neurological: Alert and oriented to person place and time. Skin: Skin is warm and dry. No rashes noted. Psychiatric: Normal mood and affect. Behavior is normal.  RELEVANT LABS AND IMAGING: CBC    Component Value Date/Time   WBC 6.9 08/05/2012 0620   RBC 3.63* 08/05/2012 0620   RBC 3.21* 07/30/2007 0710   HGB 9.1* 05/25/2013 0940   HCT 31.3* 05/25/2013 0940   PLT 420* 08/05/2012 0620   MCV 83.7 08/05/2012 0620   MCH 23.4* 08/05/2012 0620   MCHC 28.0* 08/05/2012 0620   RDW 16.9* 08/05/2012 0620   LYMPHSABS 1.4 07/12/2012 1209   MONOABS 1.3* 07/12/2012 1209   EOSABS 0.0 07/12/2012 1209   BASOSABS 0.0 07/12/2012 1209    CMP     Component Value Date/Time   NA 139 08/08/2012 0800   K 3.8 08/08/2012 0800   CL 95* 08/08/2012 0800   CO2 37* 08/08/2012 0800   GLUCOSE 130* 08/08/2012 0800   BUN 42* 08/08/2012 0800   CREATININE 0.71 08/08/2012 0800   CALCIUM 9.5 08/08/2012 0800   PROT  7.9 07/20/2012 0540   ALBUMIN 2.2* 07/20/2012 0540   AST 12 07/20/2012 0540   ALT 15 07/20/2012 0540   ALKPHOS 149* 07/20/2012 0540   BILITOT 0.2* 07/20/2012 0540   GFRNONAA >90 08/08/2012 0800   GFRAA >90 08/08/2012 0800    ASSESSMENT/PLAN:  64 yo male with PMH of 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 Leanord Hawking to evaluate epigastric pain.  1. Epigastric pain -- worse postprandially and improving with Maalox. This raises the question of gastritis. We'll treat with pantoprazole 40 mg daily and continue ranitidine 150 mg each evening. If not better would recommend further evaluation possibly with repeat endoscopy versus abdominal imaging to rule out gallbladder stones or dysfunction.  2. Anemia -- normocytic per PCP records from November 2015. He is now off oral iron. Repeat CBC and iron studies today. Upper endoscopy and colonoscopy performed 15 months ago and unremarkable for source for anemia. If he does need oral iron after labs today will have to add MiraLAX to avoid constipation.  3. CRC screening -- colonoscopy without polyps October 2014, repeat screening colonoscopy recommended October 2024  4. ? Dysphagia -- patient denies this today after I inquired on 2 occasions during our interview. Esophagus was normal 15 months ago without evidence of stricture or esophagitis. No further eval or treatment today given this is not a current complaint

## 2014-08-13 NOTE — Patient Instructions (Signed)
Your physician has requested that you go to the basement for the following lab work before leaving today: CBC, Cmet, IBC, Ferritin.   We have sent the following medications to your pharmacy for you to pick up at your convenience:pantoprazole.  Continue Zantac 150 mg one tablet by mouth at bedtime.   Please schedule follow up if pain fails to improve.

## 2014-08-17 NOTE — Progress Notes (Addendum)
Patient ID: [name], male   DOB: 1950/08/31, 64 y.o.   MRN: [id number]              PROGRESS NOTE  DATE:  08/13/2014                          FAC[facility]: [facility]    LEVEL OF CARE:   SNF   Routine Visit   CHIEF COMPLAINT:   Routine visit/[facility] visit.    HISTORY OF PRESENT ILLNESS:  This is a patient who has been in the building since December 2006, largely secondary to chronic T11 paraplegia.    He has previously had a history of iron deficiency anemia.  An endoscopy and colonoscopy done in October 2014 were negative.  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  He went to have these surgically closed by Plastic Surgery at [facility] roughly two years ago.  Paradoxically, the original wounds closed.  However, the two skin harvest sites on his right buttock have remained.     I see that he has been to see Dr. Allena KatzPatel of GI related to epigastric/abdominal pain.  I have not previously known about this.  This is described as burning and worse with eating.  There is no history of dysphagia.   As noted, he had an upper endoscopy and colonoscopy in October 2014.  His epigastric pain apparently improved with Maalox.  He was given Protonix and continuing the ranitidine.  It was not felt that he needed an EGD since he had one 14 months ago.    LABORATORY DATA:  Lab work done at GI shows:    Sodium 137, potassium 3.7, BUN 28, creatinine 1.    His liver function tests are normal.    Albumin 3.7.    His hemoglobin is 11.1, microcytic, hypochromic indices.   Differential count is normal.    Serum iron is 31, saturation 9.5%, ferritin 31.5, and transferrin level at 231.9.    CURRENT MEDICATIONS:   Medication list is reviewed, noting that he is on aspirin and iron.    REVIEW OF SYSTEMS:   The only thing new here is that the patient has complained of pain in the ulnar distribution of his right hand.    PHYSICAL EXAMINATION:     CHEST/RESPIRATORY:  Clear air entry bilaterally.   CARDIOVASCULAR:  CARDIAC:   Heart sounds are normal.    GASTROINTESTINAL:  ABDOMEN:   Soft.  No tenderness.  Bowel sounds are active.  Left lower quadrant colostomy.   SKIN:  INSPECTION:  Wounds are not reviewed today.    ASSESSMENT/PLAN:                 Epigastric pain.  I was really not aware of this until today.  This was apparently postprandial.  He had a normal endoscopy 14 months ago.  He was treated with Protonix and ranitidine by his GI doctor.   I think the next step here would probably be imaging studies of his gallbladder however he is not complaining of this today  Anemia.  Reviewing hemoglobin shows this to be high at 11.1.  I do not think anything further needs to be done here.    The patient is not complaining of epigastric pain today, although he says he was some months ago.    I will review his wounds next week.  CPT CODE: 9930_____

## 2014-08-20 ENCOUNTER — Non-Acute Institutional Stay (SKILLED_NURSING_FACILITY): Payer: Medicare Other | Admitting: Internal Medicine

## 2014-08-20 DIAGNOSIS — L89302 Pressure ulcer of unspecified buttock, stage 2: Secondary | ICD-10-CM

## 2014-08-20 DIAGNOSIS — R1013 Epigastric pain: Secondary | ICD-10-CM

## 2014-08-20 DIAGNOSIS — D509 Iron deficiency anemia, unspecified: Secondary | ICD-10-CM

## 2014-08-22 NOTE — Progress Notes (Addendum)
Patient ID: [name], male   DOB: 20-May-1951, 64 y.o.   MRN: [id number]               PROGRESS NOTE  DATE:  08/20/2014               FACILITY: [facility]    LEVEL OF CARE:   SNF   Acute Visit   CHIEF COMPLAINT:  Wound review/review of other medical issues.     HISTORY OF PRESENT ILLNESS:  [name] was seen today for review of his chronic pressure ulcers.  These were actually initially surgical harvest sites for a deeper wound on his coccyx.  Paradoxically, the coccyx wound has resolved.  However, the original skin harvest sites on his right buttock are still open.  These have not deteriorated.  There is healthy granulation.  However, the only area the patient can lie on is his buttocks.  I think there is an unrelieved pressure component here.  My goal in terms of these wounds has largely been maintenance.  The areas, however, are somewhat better and remain noninfected.    He also has had lower extremity wounds.  His legs are essentially in extension and external rotation at the hips and contracted.  The skin is tight.  The facility has been applying Eucerin cream and wrapping with a Kerlix/Coban.  This has really kept him without any significant wounds on his lower extremities for quite some time.      Finally, he was sent to GI for review of epigastric pain.  He apparently had some dyspepsia type pain with some burning epigastric pain and possibly esophageal pain.  He went to see Dr. Posey Williams.  He is now on a proton pump inhibitor and an H2 blocker.  The patient states the pain is gone.  He attributes this to something he ate at a [employer] or had ordered in here.  He has not had any further nausea, vomiting, or abdominal pain.  There have been no other changes.    LABORATORY DATA:   Lab work that was ordered on 08/13/2014 from Dr. Serita Grit office shows:    Hemoglobin was 11.1.  This is up from 10 on 06/26/2014.  His white count was 9.5, platelet count 367.  He is still  somewhat microcytic and hypochromic.    A comprehensive metabolic panel showed an albumin of 3.7, BUN of 28, creatinine of 1, potassium of 3.7.     His liver function tests were normal including alk phos of 95, AST of 16, ALT of 18.            REVIEW OF SYSTEMS:   CHEST/RESPIRATORY:  No shortness of breath.  CARDIAC:   No chest pain.     GI:  States his burning epigastric pain is better to resolved.   NEUROLOGICAL:   Right hand:  He is complaining of numbness in an ulnar distribution here.  I will follow this in-house for now before considering additional investigations.  This is in the right hand.    PHYSICAL EXAMINATION:   GENERAL APPEARANCE:  The patient looks in no distress.   SKIN:  INSPECTION:  Wound exam on the right buttocks:  Clean wounds that are actually probably somewhat smaller over time.  There is no surrounding infection here.  I think the nonhealing of these is related to unavoidable pressure, even with his pressure relief surface, etc.   CARDIOVASCULAR:  CARDIAC:   Heart sounds are normal.  There are  no murmurs.   GASTROINTESTINAL:  LIVER/SPLEEN/KIDNEYS:  No liver, no spleen.  No tenderness.   MUSCULOSKELETAL:   EXTREMITIES:  No open wounds.  Thick, leathery skin.  Neuropathic changes in the skin of his feet.     ASSESSMENT/PLAN:                            Pressure ulcer.  I would not change the current dressing, which is largely a calcium alginate.  I do not expect healing here.  However, keeping these clean and uninfected is a reasonable alternative goal.    Epigastric pain.  He is now on Zantac 150 daily.  Dr. Posey Williams had suggested Protonix 40 mg daily.  I am really uncertain whether this was initiated or not.  I will check with the nurses.  In any case, the problem seems much better.    Iron deficiency anemia.   He is on b.i.d. iron.  He will need follow-up CBC and reticulocytes, although his hemoglobin seems to be better.  He did have a colonoscopy and endoscopy in  2014.    Dr. Posey Williams comments on dysphagia, although I was not able to get this out of the patient today.  He had a normal esophagus 15 months ago.    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  The duration of this is unclear.  I note that the right hand was x-rayed in November for reasons that are not totally clear.  In any case, I wonder if there have been issues here.  I will revisit this issue.

## 2014-09-02 ENCOUNTER — Other Ambulatory Visit: Payer: Self-pay | Admitting: *Deleted

## 2014-09-02 MED ORDER — OXYCODONE-ACETAMINOPHEN 7.5-325 MG PO TABS: ORAL_TABLET | ORAL | Status: DC

## 2014-09-02 NOTE — Telephone Encounter (Signed)
[facility] 

## 2014-09-17 ENCOUNTER — Non-Acute Institutional Stay (SKILLED_NURSING_FACILITY): Payer: Medicare Other | Admitting: Internal Medicine

## 2014-09-17 DIAGNOSIS — D509 Iron deficiency anemia, unspecified: Secondary | ICD-10-CM

## 2014-09-17 DIAGNOSIS — R1013 Epigastric pain: Secondary | ICD-10-CM

## 2014-09-17 DIAGNOSIS — G5600 Carpal tunnel syndrome, unspecified upper limb: Secondary | ICD-10-CM

## 2014-09-20 NOTE — Progress Notes (Addendum)
Patient ID: [name], male   DOB: [birth year], 64 y.o.   MRN: [id number]                PROGRESS NOTE  DATE:  09/17/2014                 FACILITY[facility]                   LEVEL OF CARE:   SNF   Routine Visit                                  CHIEF COMPLAINT:  Review of general medical issues.      HISTORY OF PRESENT ILLNESS:  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    He has a history of iron deficiency anemia.  An endoscopy and colonoscopy were done in October 2014 that were negative.  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  He went to have these surgically closed by Plastic Surgery at [facility] 2-3 years ago.  Paradoxically, the original wounds closed.  However, the two skin harvest sites in the right buttock have remained; however, stable.    He is not complaining of abdominal pain.    He is complaining about an aching pain in his right hand.  He has been complaining of that for the last several weeks.    REVIEW OF SYSTEMS:    MUSCULOSKELETAL:  The only thing here that he is complaining about is pain in the thumb and first finger of his right hand.   He says this is worse at night.    PHYSICAL EXAMINATION:   CHEST/RESPIRATORY:  Clear air entry bilaterally.    CARDIOVASCULAR:   CARDIAC:  Heart sounds are normal.   GASTROINTESTINAL:   ABDOMEN:  His abdomen is normal.  I note his left lower quadrant colostomy, and he appears to have had a previous open cholecystectomy.    ASSESSMENT/PLAN:                                   Epigastric pain.  I think this is resolving.    Iron deficiency anemia.  This has not been a major issue.    Right hand pain.  This is actually in the median nerve distribution, a bit unusual although I would wonder about carpal tunnel syndrome.    Hypertension.  This appears to be stable.

## 2014-10-01 ENCOUNTER — Other Ambulatory Visit: Payer: Self-pay | Admitting: *Deleted

## 2014-10-01 MED ORDER — OXYCODONE-ACETAMINOPHEN 7.5-325 MG PO TABS
ORAL_TABLET | ORAL | Status: DC
Start: 2014-10-01 — End: 2014-11-04

## 2014-10-01 NOTE — Telephone Encounter (Signed)
[facility] 

## 2014-10-22 ENCOUNTER — Non-Acute Institutional Stay (SKILLED_NURSING_FACILITY): Payer: Medicare Other | Admitting: Internal Medicine

## 2014-10-22 DIAGNOSIS — D509 Iron deficiency anemia, unspecified: Secondary | ICD-10-CM | POA: Diagnosis not present

## 2014-10-22 DIAGNOSIS — G5600 Carpal tunnel syndrome, unspecified upper limb: Secondary | ICD-10-CM

## 2014-10-22 DIAGNOSIS — R1013 Epigastric pain: Secondary | ICD-10-CM | POA: Diagnosis not present

## 2014-10-25 NOTE — Progress Notes (Signed)
Patient ID: [name], male   DOB: 15-Mar-1951, 64 y.o.   MRN: [id number]                PROGRESS NOTE  DATE:  10/22/2014              FACILITY[facility]                                  LEVEL OF CARE:   SNF   Routine Visit                                              HISTORY OF PRESENT ILLNESS:  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     He has a history of iron deficiency anemia.  Endoscopy and colonoscopy done in October 2014 were negative.    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  He went to have these surgically closed by Plastic Surgery at [facility] roughly 2-3 years ago.  Paradoxically, the original wound closed.  However, the two skin harvest sites in the right buttock have remained open.      He is not complaining of abdominal pain.    He is complaining about an aching pain in his right hand in a median nerve distribution.  That has been going on for the past month.     He follows with Psychiatry for depression.  He is on Depakote, Effexor, Ambien, and Abilify for this.    He seems to be stable.  As mentioned, he is followed and was seen by Psychiatry.  They have not recommended gradual dose reductions due to decompensation.    REVIEW OF SYSTEMS:    MUSCULOSKELETAL:  He is only positive for the pain in his hand.     PHYSICAL EXAMINATION:   GENERAL APPEARANCE:  The patient is not in any distress.   CHEST/RESPIRATORY:  Exam is clear.                 CARDIOVASCULAR:   CARDIAC:  Heart sounds are normal.           GASTROINTESTINAL:   ABDOMEN:  Colostomy noted.  No tenderness.     SKIN:   INSPECTION:  I did not review his wounds today.   We will do that next visit.     MUSCULOSKELETAL:   EXTREMITIES:   RIGHT UPPER EXTREMITY:  He has pain in a clear median nerve distribution of his right hand.    There is no real  sensory loss or motor disturbance here.  However, palpation over the flexor retinaculum increases the pain.    ASSESSMENT/PLAN:                                  Iron deficiency anemia.    This has not recently been a major issue.  His last hemoglobin was 9.9 on 09/27/2014.   I think this has largely been stable for quite a period of time now.     Epigastric pain.   He is not currently complaining of this.  Right hand pain.  I suspect this is carpal tunnel syndrome.  He seems reluctant to go out for nerve conduction studies.  I am, therefore, going to try a wrist splint to maintain in a neutral posture.  If this fails, then I think going out for nerve conduction studies is in order.    Hypertension.  As far as I am able to see, this is stable.    Severe depression.  I do not see any of this, suggesting that the current therapy is effective.       CPT CODE: 16109

## 2014-10-29 ENCOUNTER — Non-Acute Institutional Stay (SKILLED_NURSING_FACILITY): Payer: Medicare Other | Admitting: Internal Medicine

## 2014-10-29 DIAGNOSIS — L89302 Pressure ulcer of unspecified buttock, stage 2: Secondary | ICD-10-CM

## 2014-11-01 NOTE — Progress Notes (Signed)
Patient ID: [name], male   DOB: 10/11/1950, 64 y.o.   MRN: [id number]                PROGRESS NOTE  DATE:  10/29/2014         [facility]AC[facility]          LEVEL OF CARE:   SNF   Acute Visit             CHIEF COMPLAINT:  Wound review.       HISTORY OF PRESENT ILLNESS:  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  The coccyx wound actually closed, but he has been left with harvest site wounds.  The whole thing is hampered by difficulty with turning, although he is motivated.    PHYSICAL EXAMINATION:   SKIN:   INSPECTION:  The two harvest sites look much the same as previously.  One is bleeding.  They are quite friable.  There is some moisture here.    ASSESSMENT/PLAN:           Surgical wounds on his right buttock, which were harvest sites.  The initial wound, which was a stage IV pressure ulcer, has healed.   I am going to try him on [facility] as it looks like there is moisture here.  There is no evidence of infection.    His other medical issues were reviewed last week.     CPT CODE: [id number]

## 2014-11-04 ENCOUNTER — Other Ambulatory Visit: Payer: Self-pay

## 2014-11-04 ENCOUNTER — Encounter (HOSPITAL_COMMUNITY): Payer: Self-pay | Admitting: *Deleted

## 2014-11-04 ENCOUNTER — Emergency Department (HOSPITAL_COMMUNITY)
Admission: EM | Admit: 2014-11-04 | Discharge: 2014-11-05 | Disposition: A | Discharge status: Home / Self Care | Payer: Medicare Other | Attending: Emergency Medicine | Admitting: Emergency Medicine

## 2014-11-04 ENCOUNTER — Other Ambulatory Visit: Payer: Self-pay | Admitting: *Deleted

## 2014-11-04 DIAGNOSIS — Z79899 Other long term (current) drug therapy: Secondary | ICD-10-CM | POA: Insufficient documentation

## 2014-11-04 DIAGNOSIS — G40909 Epilepsy, unspecified, not intractable, without status epilepticus: Secondary | ICD-10-CM | POA: Diagnosis not present

## 2014-11-04 DIAGNOSIS — S91111A Laceration without foreign body of right great toe without damage to nail, initial encounter: Secondary | ICD-10-CM | POA: Diagnosis not present

## 2014-11-04 DIAGNOSIS — D649 Anemia, unspecified: Secondary | ICD-10-CM | POA: Diagnosis not present

## 2014-11-04 DIAGNOSIS — I509 Heart failure, unspecified: Secondary | ICD-10-CM | POA: Diagnosis not present

## 2014-11-04 DIAGNOSIS — Y9389 Activity, other specified: Secondary | ICD-10-CM | POA: Diagnosis not present

## 2014-11-04 DIAGNOSIS — F419 Anxiety disorder, unspecified: Secondary | ICD-10-CM | POA: Diagnosis not present

## 2014-11-04 DIAGNOSIS — K219 Gastro-esophageal reflux disease without esophagitis: Secondary | ICD-10-CM | POA: Insufficient documentation

## 2014-11-04 DIAGNOSIS — I2510 Atherosclerotic heart disease of native coronary artery without angina pectoris: Secondary | ICD-10-CM | POA: Insufficient documentation

## 2014-11-04 DIAGNOSIS — I10 Essential (primary) hypertension: Secondary | ICD-10-CM | POA: Diagnosis not present

## 2014-11-04 DIAGNOSIS — Z8701 Personal history of pneumonia (recurrent): Secondary | ICD-10-CM | POA: Diagnosis not present

## 2014-11-04 DIAGNOSIS — K589 Irritable bowel syndrome without diarrhea: Secondary | ICD-10-CM | POA: Diagnosis not present

## 2014-11-04 DIAGNOSIS — S91311A Laceration without foreign body, right foot, initial encounter: Secondary | ICD-10-CM

## 2014-11-04 DIAGNOSIS — Z87440 Personal history of urinary (tract) infections: Secondary | ICD-10-CM | POA: Insufficient documentation

## 2014-11-04 DIAGNOSIS — E669 Obesity, unspecified: Secondary | ICD-10-CM | POA: Diagnosis not present

## 2014-11-04 DIAGNOSIS — S99921A Unspecified injury of right foot, initial encounter: Secondary | ICD-10-CM | POA: Diagnosis present

## 2014-11-04 DIAGNOSIS — Z7982 Long term (current) use of aspirin: Secondary | ICD-10-CM | POA: Diagnosis not present

## 2014-11-04 DIAGNOSIS — W231XXA Caught, crushed, jammed, or pinched between stationary objects, initial encounter: Secondary | ICD-10-CM | POA: Diagnosis not present

## 2014-11-04 DIAGNOSIS — Y998 Other external cause status: Secondary | ICD-10-CM | POA: Diagnosis not present

## 2014-11-04 DIAGNOSIS — Y9289 Other specified places as the place of occurrence of the external cause: Secondary | ICD-10-CM | POA: Diagnosis not present

## 2014-11-04 MED ORDER — ZOLPIDEM TARTRATE 5 MG PO TABS: ORAL_TABLET | ORAL | Status: DC

## 2014-11-04 MED ORDER — OXYCODONE-ACETAMINOPHEN 7.5-325 MG PO TABS: ORAL_TABLET | ORAL | Status: DC

## 2014-11-04 NOTE — ED Notes (Signed)
Bed: WA24 Expected date:  Expected time:  Means of arrival:  Comments: ems 

## 2014-11-04 NOTE — ED Notes (Signed)
Per EMS pt from [facility], was in hoyer lift, when they went to move him, hit R great toe on dresser, inch to inch and half laceration noted by fire, wrapped, bleeding controlled, stated pt probably needs stitches.

## 2014-11-04 NOTE — ED Notes (Signed)
Pt states staff at [facility] was getting him back in bed w/ lift tonight when they hit his R foot/Great toe on something, states foot bleeding and they were unable to stop it, inch and half laceration noted to R great toe, foot rewrapped to help bleeding.

## 2014-11-04 NOTE — Telephone Encounter (Signed)
[facility] 

## 2014-11-05 ENCOUNTER — Encounter: Payer: Self-pay | Admitting: *Deleted

## 2014-11-05 ENCOUNTER — Emergency Department (HOSPITAL_COMMUNITY): Payer: Medicare Other

## 2014-11-05 DIAGNOSIS — S91111A Laceration without foreign body of right great toe without damage to nail, initial encounter: Secondary | ICD-10-CM | POA: Diagnosis not present

## 2014-11-05 MED ORDER — OXYCODONE-ACETAMINOPHEN 5-325 MG PO TABS
1.0000 | ORAL_TABLET | Freq: Once | ORAL | Status: AC
  Administered 2014-11-05: 1 via ORAL
  Filled 2014-11-05: qty 1

## 2014-11-05 MED ORDER — LIDOCAINE HCL 2 % IJ SOLN
5.0000 mL | Freq: Once | INTRAMUSCULAR | Status: AC
  Administered 2014-11-05: 100 mg via INTRADERMAL
  Filled 2014-11-05: qty 20

## 2014-11-05 MED ORDER — ZOLPIDEM TARTRATE 5 MG PO TABS: ORAL_TABLET | ORAL | Status: DC

## 2014-11-05 NOTE — ED Notes (Signed)
Patient transported to X-ray 

## 2014-11-05 NOTE — ED Provider Notes (Signed)
CSN: [id number]     Arrival date & time 11/04/14  2158 History   First MD Initiated Contact with Patient 11/05/14 0202     Chief Complaint  Patient presents with  . Toe Injury     (Consider location/radiation/quality/duration/timing/severity/associated sxs/prior Treatment) Patient is a 64 y.o. male presenting with foot injury. The history is provided by the patient. No language interpreter was used.  Foot Injury Location:  Foot Injury: yes   Foot location:  R foot Chronicity:  New Associated symptoms: no fever   Associated symptoms comment:  The patient has a history of paraplegia. [facility] and [facility] when his right foot got caught in the frame causing laceration of plantar great toe. No other injury.   Past Medical History  Diagnosis Date  . Coronary artery disease   . CHF (congestive heart failure)   . Paraplegia   . GERD (gastroesophageal reflux disease)   . Seizure   . Anemia   . Anxiety   . Hypertension   . Obesity   . Pneumonia   . Urinary tract infection   . Blood clotting disorder   . Irritable bowel syndrome    Past Surgical History  Procedure Laterality Date  . Colonostomy    . Colon surgery      colonostomy  . Esophagogastroduodenoscopy (egd) with propofol N/A 05/25/2013    Procedure: ESOPHAGOGASTRODUODENOSCOPY (EGD) WITH PROPOFOL;  Surgeon: Graylin ShiverSalem F Ganem, MD;  Location: [facility];  Service: Endoscopy;  Laterality: N/A;  . Colonoscopy with propofol N/A 05/25/2013    Procedure: COLONOSCOPY WITH PROPOFOL;  Surgeon: Graylin ShiverSalem F Ganem, MD;  Location: [facility];  Service: Endoscopy;  Laterality: N/A;   Family History  Problem Relation Age of Onset  . Heart failure Mother   . Heart failure Father   . Colon cancer Neg Hx   . Colon polyps Neg Hx   . Kidney disease Neg Hx   . Gallbladder disease Neg Hx   . Brain cancer Brother     Had a tumor   History  Substance Use Topics  . Smoking status: Never Smoker   .  Smokeless tobacco: Never Used  . Alcohol Use: No    Review of Systems  Constitutional: Negative for fever and chills.  Musculoskeletal:       See HPI.  Skin: Positive for wound.  Neurological: Negative.  Negative for numbness.      Allergies  Ciprofloxacin and Minocycline hcl  Home Medications   Prior to Admission medications   Medication Sig Start Date End Date Taking? Authorizing Provider  acetaZOLAMIDE (DIAMOX) 250 MG tablet Take 500 mg by mouth daily.   Yes Historical Provider, MD  ARIPiprazole (ABILIFY) 5 MG tablet Take 5 mg by mouth daily.  07/29/14  Yes Historical Provider, MD  aspirin EC 81 MG tablet Take 81 mg by mouth daily.   Yes Historical Provider, MD  divalproex (DEPAKOTE) 500 MG DR tablet Take 500 mg by mouth 2 (two) times daily.   Yes Historical Provider, MD  docusate sodium (COLACE) 100 MG capsule Take 200 mg by mouth daily.   Yes Historical Provider, MD  ferrous sulfate 325 (65 FE) MG tablet Take 325 mg by mouth 2 (two) times daily with a meal.   Yes Historical Provider, MD  fluticasone (FLONASE) 50 MCG/ACT nasal spray Place 1 spray into the nose at bedtime.    Yes Historical Provider, MD  furosemide (LASIX) 20 MG tablet Take 60 mg  by mouth 2 (two) times daily.   Yes Historical Provider, MD  latanoprost (XALATAN) 0.005 % ophthalmic solution Place 1 drop into both eyes at bedtime.   Yes Historical Provider, MD  loratadine (CLARITIN) 10 MG tablet Take 10 mg by mouth daily.    Yes Historical Provider, MD  metoprolol succinate (TOPROL-XL) 25 MG 24 hr tablet Take 12.5 mg by mouth every morning.   Yes Historical Provider, MD  Multiple Vitamins-Minerals (CERTA-VITE PO) Take 1 tablet by mouth daily.   Yes Historical Provider, MD  NASAL SPRAY SALINE NA Place into the nose daily. Pt uses 2 sprays in each nostril every day   Yes Historical Provider, MD  oxybutynin (DITROPAN-XL) 10 MG 24 hr tablet Take 20 mg by mouth at bedtime.   Yes Historical Provider, MD  oxyCODONE (OXY  IR/ROXICODONE) 5 MG immediate release tablet Take 5 mg by mouth every 6 (six) hours as needed for severe pain or breakthrough pain.   Yes Historical Provider, MD  pantoprazole (PROTONIX) 40 MG tablet Take 1 tablet (40 mg total) by mouth daily. 08/13/14  Yes Beverley Fiedler, MD  Polyethyl Glycol-Propyl Glycol (SYSTANE OP) Place 1 drop into both eyes daily.   Yes Historical Provider, MD  polyethylene glycol (MIRALAX / GLYCOLAX) packet Take 17 g by mouth daily.   Yes Historical Provider, MD  potassium chloride (K-DUR,KLOR-CON) 10 MEQ tablet Take 10 mEq by mouth daily.   Yes Historical Provider, MD  ranitidine (ZANTAC) 150 MG tablet Take 150 mg by mouth daily.  07/24/14  Yes Historical Provider, MD  rOPINIRole (REQUIP) 1 MG tablet Take 1.5 mg by mouth at bedtime.   Yes Historical Provider, MD  saccharomyces boulardii (FLORASTOR) 250 MG capsule Take 250 mg by mouth 2 (two) times daily.   Yes Historical Provider, MD  sennosides-docusate sodium (SENOKOT-S) 8.6-50 MG tablet Take 1 tablet by mouth 2 (two) times daily.    Yes Historical Provider, MD  venlafaxine (EFFEXOR) 75 MG tablet Take 75 mg by mouth daily.   Yes Historical Provider, MD  venlafaxine XR (EFFEXOR-XR) 75 MG 24 hr capsule Take 75 mg by mouth daily with breakfast.   Yes Historical Provider, MD  vitamin C (ASCORBIC ACID) 500 MG tablet Take 500 mg by mouth 2 (two) times daily.    Yes Historical Provider, MD  zolpidem (AMBIEN) 5 MG tablet Take one tablet by mouth every night at bedtime as needed for rest 11/04/14  Yes Sharon Seller, NP  ferrous fumarate (HEMOCYTE - 106 MG FE) 325 (106 FE) MG TABS tablet Take 1 tablet by mouth 3 (three) times daily.    Historical Provider, MD  FLUoxetine (PROZAC) 20 MG capsule Take 60 mg by mouth 2 (two) times daily.    Historical Provider, MD  guaiFENesin (MUCINEX) 600 MG 12 hr tablet Take 600 mg by mouth 2 (two) times daily. Take for 10 days.  Last dose due on the morning of 07/20/2012.    Historical Provider, MD   hydroxypropyl methylcellulose (ISOPTO TEARS) 2.5 % ophthalmic solution Place 1 drop into both eyes every 12 (twelve) hours.    Historical Provider, MD  hydrOXYzine (ATARAX/VISTARIL) 25 MG tablet Take 25 mg by mouth every 6 (six) hours as needed for itching.    Historical Provider, MD  iron polysaccharides (NIFEREX) 150 MG capsule Take 150 mg by mouth daily.    Historical Provider, MD  lidocaine (LIDODERM) 5 % Place 1 patch onto the skin daily. Remove & Discard patch within 12 hours or as  directed by MD    Historical Provider, MD  oxybutynin (DITROPAN) 5 MG tablet Take 5 mg by mouth daily.  07/04/14   Historical Provider, MD  oxycodone (OXY-IR) 5 MG capsule Take one tablet by mouth every 6 hours as needed for breakthrough pain Patient not taking: Reported on 11/04/2014 07/17/14   Sharee Holster, NP  oxyCODONE-acetaminophen (PERCOCET) 7.5-325 MG per tablet Take one tablet by mouth twice daily for pain DO NOT EXCEED 4 GM OF TYLENOL IN 24 HOURS Patient taking differently: Take 1 tablet by mouth 2 (two) times daily.  11/04/14   Tiffany L Reed, DO  rOPINIRole (REQUIP) 0.5 MG tablet Take 0.5 mg by mouth daily.  08/06/14   Historical Provider, MD  Vitamin D, Ergocalciferol, (DRISDOL) 50000 UNITS CAPS capsule Take 50,000 Units by mouth every 30 (thirty) days.    Historical Provider, MD   BP 127/68 mmHg  Pulse 80  Temp(Src) 98 F (36.7 C) (Oral)  Resp 20  SpO2 92% Physical Exam  Constitutional: He appears well-developed and well-nourished. No distress.  Musculoskeletal:  Contracture deformities to right foot.   Skin:  4-5 cm linear laceration to right great toe on plantar surface. Chronic contracture deformities to foot, acute deformity indeterminate.     ED Course  Procedures (including critical care time) Labs Review Labs Reviewed - No data to display  Imaging Review No results found.   EKG Interpretation None     LACERATION REPAIR Performed by: Elpidio Anis A Authorized by:  Elpidio Anis A Consent: Verbal consent obtained. Risks and benefits: risks, benefits and alternatives were discussed Consent given by: patient Patient identity confirmed: provided demographic data Prepped and Draped in normal sterile fashion Wound explored  Laceration Location: right foot, plantar great toe  Laceration Length: 4cm  No Foreign Bodies seen or palpated  Anesthesia: local infiltration  Local anesthetic: lidocaine 2% w/o epinephrine  Anesthetic total: 3 ml  Irrigation method: syringe Amount of cleaning: standard  Skin closure: 4-0 prolene  Number of sutures: 7  Technique: simple interrupted.  **The deformity of the toe made the inferior most portion of laceration inaccessible for repair. Digit is non-mobile, stable, with laceration held in closed, approximated position, which will aid normal healing.  Patient tolerance: Patient tolerated the procedure well with no immediate complications.  MDM   Final diagnoses:  None    1. Laceration right foot  Isolated laceration injury right foot repaired as per above note.     Elpidio Anis, PA-C 11/05/14 0435  Paula Libra, MD 11/05/14 [phone]

## 2014-11-05 NOTE — ED Notes (Signed)
[facility] called to transport [facility]

## 2014-11-05 NOTE — ED Notes (Signed)
Dressing applied to R great toe to cover sutures.

## 2014-11-05 NOTE — Discharge Instructions (Signed)
Sutured Wound Care °Sutures are stitches that can be used to close wounds. Wound care helps prevent pain and infection.  °HOME CARE INSTRUCTIONS  °· Rest and elevate the injured area until all the pain and swelling are gone. °· Only take over-the-counter or prescription medicines for pain, discomfort, or fever as directed by your caregiver. °· After 48 hours, gently wash the area with mild soap and water once a day, or as directed. Rinse off the soap. Pat the area dry with a clean towel. Do not rub the wound. This may cause bleeding. °· Follow your caregiver's instructions for how often to change the bandage (dressing). Stop using a dressing after 2 days or after the wound stops draining. °· If the dressing sticks, moisten it with soapy water and gently remove it. °· Apply ointment on the wound as directed. °· Avoid stretching a sutured wound. °· Drink enough fluids to keep your urine clear or pale yellow. °· Follow up with your caregiver for suture removal as directed. °· Use sunscreen on your wound for the next 3 to 6 months so the scar will not darken. °SEEK IMMEDIATE MEDICAL CARE IF:  °· Your wound becomes red, swollen, hot, or tender. °· You have increasing pain in the wound. °· You have a red streak that extends from the wound. °· There is pus coming from the wound. °· You have a fever. °· You have shaking chills. °· There is a bad smell coming from the wound. °· You have persistent bleeding from the wound. °MAKE SURE YOU:  °· Understand these instructions. °· Will watch your condition. °· Will get help right away if you are not doing well or get worse. °Document Released: 09/02/2004 Document Revised: 10/18/2011 Document Reviewed: 11/29/2010 °ExitCare® Patient Information ©2015 [employer]. This information is not intended to replace advice given to you by your health care provider. Make sure you discuss any questions you have with your health care provider. ° °

## 2014-11-12 ENCOUNTER — Non-Acute Institutional Stay (SKILLED_NURSING_FACILITY): Payer: Medicare Other | Admitting: Internal Medicine

## 2014-11-12 DIAGNOSIS — L89302 Pressure ulcer of unspecified buttock, stage 2: Secondary | ICD-10-CM | POA: Diagnosis not present

## 2014-11-16 NOTE — Progress Notes (Signed)
Patient ID: [name], male   DOB: Aug 05, 1951, 64 y.o.   MRN: [id number]                PROGRESS NOTE  DATE:  11/12/2014         [facility]             LEVEL OF CARE:   SNF   Acute Visit             CHIEF COMPLAINT:  Review of laceration of his right foot, pressure ulcers.    HISTORY OF PRESENT ILLNESS:  [name] was over in the ER on 11/04/2014 with a laceration of his right foot, marginally involving his right great toe.  The history was [facility]  I believe this was from chair to bed, although it may have been vice versa.  In any case, he traumatized his foot on the bottom of the bed.  There is a fairly substantial laceration on the lateral aspect of the right great toe, curving into the base of the right great toe.  He had seven sutures, although there is still an open area at the base of the toe and I wonder if this should have been sutured.  It is late for that now.    Apparently, he was in the ER for a prolonged period of time flat on his back.  He came back with a deterioration in the left buttock wounds.  I am reviewing him today because of this.    PHYSICAL EXAMINATION:   SKIN:   INSPECTION:  Left buttock:  Indeed, there is a new area on the left buttock which is superficial, stage 2.  The major wound that he had previously has expanded and at least 50% of this area is covered with an eschar.  There is no obvious infection here.   Right foot:  The laceration is a linear area that extends laterally around the toe into the base of the right great toe.  I think the base of this is open, as well.  I find the whole thing somewhat mysterious.  I did spend some time looking around the bottom of the bed for an edge that could have created a wound like this.  However, I do not see anything that would appear to be that sharp, although his skin is really fragile.    ASSESSMENT/PLAN:                  Pressure ulcer on the left  buttock.  This has deteriorated.  Apparently, he spent all night on a stretcher in the ER which may have contributed to this.  They are now using a Santyl dressing to the major area on the buttock with an alginate cover.  I may need to do a mechanical debridement of this area.    Extensive laceration of the right great toe.  Silver alginate over the toe, including the area that I do not think was sutured, will be necessary.     CPT CODE: [id number]

## 2014-12-04 ENCOUNTER — Telehealth: Payer: Self-pay

## 2014-12-04 ENCOUNTER — Other Ambulatory Visit: Payer: Self-pay

## 2014-12-04 MED ORDER — OXYCODONE-ACETAMINOPHEN 7.5-325 MG PO TABS: ORAL_TABLET | ORAL | Status: DC

## 2014-12-04 NOTE — Telephone Encounter (Signed)
[facility] to fax labs.

## 2014-12-04 NOTE — Telephone Encounter (Signed)
-----   Message from Lily LovingsLinda Ray Joanne Brander, RN sent at 08/14/2014 10:22 AM EST ----- Regarding: [facility] have CBC Iron Studies and Celiac panel

## 2014-12-04 NOTE — Telephone Encounter (Signed)
Rx faxed to [facility] @ [phone], phone number [phone]  

## 2014-12-05 ENCOUNTER — Telehealth: Payer: Self-pay | Admitting: *Deleted

## 2014-12-05 DIAGNOSIS — D509 Iron deficiency anemia, unspecified: Secondary | ICD-10-CM

## 2014-12-05 NOTE — Telephone Encounter (Signed)
I have spoken to [name], nurse for [employer] [name]). I have advised her that Dr Rhea BeltonPyrtle has received results of CBC, IBC and C[facility]ac panel [facility]. He notes "hemoglobin and iron remain low. Continue oral iron 325 mg twice daily. Repeat in 3 months." I have advised [name] of this and she verbalizes understanding. I have sent orders to her for repeat CBC and IBC to be drawn around 02/07/15. See scanned labs from 11/08/14.

## 2014-12-17 ENCOUNTER — Non-Acute Institutional Stay (SKILLED_NURSING_FACILITY): Payer: Medicare Other | Admitting: Internal Medicine

## 2014-12-17 DIAGNOSIS — D509 Iron deficiency anemia, unspecified: Secondary | ICD-10-CM | POA: Diagnosis not present

## 2014-12-17 DIAGNOSIS — L89302 Pressure ulcer of unspecified buttock, stage 2: Secondary | ICD-10-CM

## 2014-12-23 NOTE — Progress Notes (Addendum)
Patient ID: Maurice GravelHerman M Bonsall, male   DOB: 10-03-1950, 64 y.o.   MRN: [id number]                PROGRESS NOTE  DATE:  12/17/2014          [facility]                       LEVEL OF CARE:   SNF   Routine Visit              CHIEF COMPLAINT:  Routine visit to follow medical issues/pressure ulcer review/[facility] visit.    HISTORY OF PRESENT ILLNESS:  This is a patient who has been in the building since 2006, largely secondary to trauma with chronic T11 paraplegia.  His lower extremities are in a fixed extension and exorotation at the ankle.  This has been chronic for some period of time now.    He has a history of iron deficiency anemia.  Endoscopy and colonoscopy were done in October 2014 that did not show an obvious source.    At the time, some of his anemia was felt to be secondary to copious blood loss from very large stage IV pressure ulcers.   He went to have these closed by Plastic Surgery three years ago, roughly.  Paradoxically, the coccyx wound closed.  However, the two skin harvest sites in the right buttock have remained open ever since.  I have not really been able to get these to close.  They wax and wane.  More recently, [facility] had [facility] in the ER, I believe in March.  This caused some deterioration.    Other medical issues have included a clinical diagnosis of carpal tunnel syndrome on the right.  I gave him a wrist brace to wear at night and this seems to have helped.    He is followed by Psychiatry for depression.  He is on a complex psychiatric regimen including Depakote, Effexor, Ambien, and Abilify to augment treatment for the depression.  I have not really seen this as problematic.  Last seen by Psychiatry on April 27th.    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     History of coronary artery disease.      Gastroesophageal  reflux disease.    Seizure disorder.    Iron deficiency anemia, with previous GI work-up negative.    Depression with anxiety.    Followed by Psychiatry in the facility.    Hypertension.    History of colostomy.      Chronic Foley catheter.      CURRENT MEDICATIONS:  Medication list is reviewed.                        Diamox 500 daily.      Toprol XL 12.5 daily.      Klor-Con 10 mEq daily.      Multivitamin daily.    Ditropan XL 20 mg daily (bladder spasms).      Claritin 10 mg daily (allergic rhinitis).      Aspirin 81 q.d.       Abilify 5 q.d.       Zantac 150 q.d.       Effexor 75 q.d.       Protonix 40 q.d.  MiraLAX 17 g daily.       Requip 1-1/2 tablets, 5 mg every night at bedtime (restless legs syndrome).      Lasix 60 b.i.d.      Depakote 500 twice a day.      Colace 200 b.i.d.       Senokot-S 1 tablet b.i.d.       Ferrous sulfate 325 b.i.d.       Percocet 7.5/3.5 b.i.d.               Xalatan ophthalmic.    Ambien 5 mg q.h.s.       Vitamin D3, 50,000 U daily.       LABORATORY DATA:   Laboratory work most recently from 11/08/2014 showed a hemoglobin of 9.7 with microcytic, hypochromic indices.    Platelet count was normal.    White count was 11.9.    On 10/18/2014, his iron level was 40, TIBC 312, percent saturation of 13.  He had a celiac panel showing negative antibodies for gliadin peptide as well as tissue transglutaminase.     PHYSICAL EXAMINATION:   GENERAL APPEARANCE:  The patient is not in any distress.  He has no complaints.    He has oxygen on, although it is not in his nostrils.  Even with this, his O2 sat is 94% on room air essentially.     CHEST/RESPIRATORY:  Shallow air entry, but no crackles or wheezes.    CARDIOVASCULAR:   CARDIAC:  Heart sounds are normal.  There are no murmurs.   No signs of heart failure.     SKIN:   INSPECTION:  Extremities:  The wound on his right foot, which was a laceration, has healed well.   There are no open wounds on his legs.  The areas in question over his right buttock:  There are several superficial stage II areas here.  At least one of them is chronic.  There is friable tissue here which bleeds freely.    ASSESSMENT/PLAN:                    Chronic T11 paraplegia.  There is no major issue ongoing here.    Iron deficiency anemia.     Recent celiac panel was negative.  I do not see this as an unstable issue.  I do not know that he bleeds enough from his wounds to currently being contributing to this issue.  At some point, his reticulocytes should be checked on iron.    Diastolic heart failure.   On fairly large doses of Lasix.  I think this probably should be checked at some point, as well.     Depression with anxiety.  I see no issue here.  He follows with Psychiatry, however.    Chronic leaking from his catheter.  He has been to see Urology.  There was nothing more that they could do with this.  He is already on the largest catheter diameter plus Ditropan.    I am changing him to [facility]a Blue.    I would like to follow up on his reticulocyte count and hemoglobin at some point soon.

## 2015-01-07 ENCOUNTER — Other Ambulatory Visit: Payer: Self-pay | Admitting: *Deleted

## 2015-01-07 MED ORDER — OXYCODONE-ACETAMINOPHEN 7.5-325 MG PO TABS: ORAL_TABLET | ORAL | Status: DC

## 2015-01-07 NOTE — Telephone Encounter (Signed)
[facility] 

## 2015-02-04 ENCOUNTER — Non-Acute Institutional Stay (SKILLED_NURSING_FACILITY): Payer: Medicare Other | Admitting: Internal Medicine

## 2015-02-04 DIAGNOSIS — D509 Iron deficiency anemia, unspecified: Secondary | ICD-10-CM | POA: Diagnosis not present

## 2015-02-04 DIAGNOSIS — L89302 Pressure ulcer of unspecified buttock, stage 2: Secondary | ICD-10-CM

## 2015-02-11 NOTE — Progress Notes (Addendum)
Patient ID: [name], male   DOB: 1951/02/18, 64 y.o.   MRN: [id number]                PROGRESS NOTE  DATE:  02/04/2015              [facility]                      LEVEL OF CARE:   SNF   Acute Visit/Routine Visit   CHIEF COMPLAINT:  Review of medical issues, wound care, etc.      HISTORY OF PRESENT ILLNESS:  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  His lower extremities are in fixed extension and external rotation.    He has had a chronic pressure ulcer over his sacrum.  He [facility].  Paradoxically, that wound closed but his harvest sites did not.  I have never been able to get these sites to close.  I think the issue here is chronic unrelieved pressure.  The patient can simply not turn on his side in bed.    The patient has iron deficiency anemia.  Endoscopy and colonoscopy were done in 2014, which did not show an obvious source.    Other medical issues include a diagnosis of carpal tunnel on the right.  I gave him a brace to wear at night, which seems to have helped.    He is followed by Psychiatry for depression.  He is on a lot of psychoactive medications.    CURRENT MEDICATIONS:  Medication list is reviewed.               Diamox 2 tablets/500 mg daily.    Toprol XL 12.5 daily.      K-Dur 10 mEq daily.    Multivitamin.    Ditropan XL 2 tablets/20 mg by mouth daily (bladder spasms in the setting of a suprapubic catheter).    Claritin 10 q.d.       ASA 81 q.d.       Abilify 5 q.d.       Zantac 150 q.d.      Effexor XR 75 mg daily.    Protonix 40 q.d.       MiraLAX 17 g daily.    Systane ophthalmic.    Requip 5 mg at night.    Florastor 250 b.i.d.        Lasix 60 q.d.      Depakote 500 mg by mouth twice daily.    Capsule 2 capsules/200 mg daily.    Senokot-S 1 tablet b.i.d.      Ferrous  sulfate 325 twice daily.      PHYSICAL EXAMINATION:   VITAL SIGNS:     02 SATURATIONS:  92% on room air.   WEIGHT:  226 pounds, which is stable.   CHEST/RESPIRATORY:  Clear air entry bilaterally.    CARDIOVASCULAR:   CARDIAC:  Heart sounds are normal.  There are no murmurs.    GASTROINTESTINAL:   ABDOMEN:  Nontender.  There is a suprapubic catheter with no issues.     SKIN:   INSPECTION:  He has a chronic wound over his right buttock.  They are using Hydrofera Blue on this, which is expensive.  I do not expect this to heal, related to unrelenting pressure over the area as discussed above.  ASSESSMENT/PLAN:           Chronic wound over his buttock, which was actually a skin harvest site.  This is not going to close.  Change back to silver alginate-based dressings. The major issue is unrelieved pressure which cannot be relieved in spite of the best efforts of all involved including patient  Pruritic rash on his right arm just above his antecubital fossa.  Looks like a topic dermatitis.  I will give him a steroid for this.    History of peripheral vascular disease.  This does not appear to be unstable.  He does not have wounds on his lower extremities currently, per the wound care nurse, although he has had these in the past.    Iron deficiency anemia without an obvious source.  Last hemoglobin on the chart was 9.7 on April 1st, microcytic, hypochromic indices.

## 2015-02-17 ENCOUNTER — Other Ambulatory Visit: Payer: Self-pay | Admitting: *Deleted

## 2015-02-17 MED ORDER — OXYCODONE HCL 5 MG PO TABS: ORAL_TABLET | ORAL | Status: DC

## 2015-02-17 NOTE — Telephone Encounter (Signed)
[facility] 

## 2015-02-26 ENCOUNTER — Other Ambulatory Visit: Payer: Self-pay | Admitting: *Deleted

## 2015-02-26 MED ORDER — ZOLPIDEM TARTRATE 5 MG PO TABS: ORAL_TABLET | ORAL | Status: DC

## 2015-02-26 NOTE — Telephone Encounter (Signed)
[facility] 

## 2015-02-27 ENCOUNTER — Telehealth: Payer: Self-pay

## 2015-02-27 ENCOUNTER — Other Ambulatory Visit: Payer: Self-pay

## 2015-02-27 DIAGNOSIS — D62 Acute posthemorrhagic anemia: Secondary | ICD-10-CM

## 2015-02-27 MED ORDER — ZOLPIDEM TARTRATE 5 MG PO TABS: ORAL_TABLET | ORAL | Status: DC

## 2015-02-27 NOTE — Telephone Encounter (Signed)
Spoke with Neysa Bonito at [facility] and informed her Dr. Rhea Belton has reviewed the lab results from 02/21/15 (Iron, IBC and CBC) and states patient should remain on Ferrous sulfate 325 mg twice daily and to add Miralax 17g daily for constipation. Also Dr. Rhea Belton wants a CBC, Ferritin, IBC panel in 3 months (around 05/24/15). Please fax these results back to [place] at [phone]. Per Neysa Bonito we can fax the order to her at [facility] at fax # [phone].

## 2015-02-27 NOTE — Telephone Encounter (Signed)
Rx faxed to [facility] @ [phone], phone number [phone]  

## 2015-03-04 ENCOUNTER — Non-Acute Institutional Stay (SKILLED_NURSING_FACILITY): Payer: Medicare Other | Admitting: Internal Medicine

## 2015-03-04 DIAGNOSIS — G5600 Carpal tunnel syndrome, unspecified upper limb: Secondary | ICD-10-CM

## 2015-03-04 DIAGNOSIS — D509 Iron deficiency anemia, unspecified: Secondary | ICD-10-CM

## 2015-03-04 DIAGNOSIS — F320 Major depressive disorder, single episode, mild: Secondary | ICD-10-CM | POA: Diagnosis not present

## 2015-03-04 DIAGNOSIS — L89302 Pressure ulcer of unspecified buttock, stage 2: Secondary | ICD-10-CM

## 2015-03-10 ENCOUNTER — Other Ambulatory Visit: Payer: Self-pay | Admitting: *Deleted

## 2015-03-10 MED ORDER — OXYCODONE-ACETAMINOPHEN 7.5-325 MG PO TABS: ORAL_TABLET | ORAL | Status: DC

## 2015-03-10 NOTE — Telephone Encounter (Signed)
[facility] 

## 2015-03-13 NOTE — Progress Notes (Addendum)
Patient ID: [name], male   DOB: 1950-12-26, 64 y.o.   MRN: [id number]                PROGRESS NOTE  DATE:  03/04/2015          [facility]                     LEVEL OF CARE:   SNF   Routine Visit              CHIEF COMPLAINT:  Review of medical issues/[facility] visit/wound follow-up of pressure areas on his right buttock.         HISTORY OF PRESENT ILLNESS:  [name] is a gentleman who came to [place] in 2006.    He has chronic T11 paraplegia.  This was as a result of a fall from a tree in the 1970s.   His lower extremities are in fixed extension and external rotation at all joints.    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  He went to see Plastic Surgery at [facility].  Paradoxically, his wound actually closed but the harvest sites on his right buttock did not.  I have never been able to get these sites to close.  I think the issue here is unrelieved pressure as well as leaking urine from his suprapubic catheter.  The patient is unable to roll from side to side.  Even though he has a pressure relief mattress, there is just too much pressure and sheer pressure on this wound site.    He is followed by Psychiatry in the facility for depression.  This has not been evident currently.    He had iron deficiency anemia.  Endoscopy and colonoscopy in 2014 did not show an obvious source.  His most recent hemoglobin was 9.7 on 11/08/2014.  This was stable.  His indices were microcytic and hypochromic.  White blood count and platelet count were normal.    CURRENT MEDICATIONS:  Medication list is reviewed.               Diamox 250, 2 tablets/500 daily.      Toprol XL 12.5 q.d.      Kcl 10 q.d.      Ditropan XL 20 mg daily (for bladder spasms, in spite of the suprapubic catheter).     Claritin 10 q.d.      ASA 81 q.d.     Abilify 5 mg daily (augmentation for depression, I believe).    Zantac 150 q.d.    Effexor 75 q.d.        Protonix 40 q.d.       MiraLAX 17 g daily.     Requip 1-1/2 tablets at night/1.5 mg.    Florastor 250 q.d.      Lasix 60 q.d.      Valproic acid 500 b.i.d.     Colace 200 b.i.d.     Senokot-S 8.6 b.i.d.     Ferrous sulfate 325 b.i.d.     Vitamin C 500 twice daily.    Percocet 7.5/325, 1 p.o. b.i.d.     Xalatan ophthalmic.     Ambien 5 mg q.h.s.      Vitamin D3, 50,000 U monthly.     LABORATORY DATA:  Last lab work:    Iron level 22, TIBC 282, percent saturation 8.    White count 15.4, hemoglobin 10.4, microcytic, hypochromic, RDW 17.7,  platelet count 339.    REVIEW OF SYSTEMS:    General: He states he feels well CHEST/RESPIRATORY:  The patient is not complaining of shortness of breath.   CARDIAC:  No chest pain.   GI:  He has a colostomy in place.  No diarrhea.  NO bleeding in his ostomy bag GU:  Still complaining about voiding difficulties related to leaking urine.  We have sent him to Urology.  Even that does not seem to have helped here.     Extremities: Numbness and pain in the Right hand still with some discomfort Mental Status: States his depression is better.   PHYSICAL EXAMINATION:   GENERAL APPEARANCE:  The patient is not in any distress.    CHEST/RESPIRATORY:  Clear air entry bilaterally.    CARDIOVASCULAR:   CARDIAC:  Heart sounds are normal.   GASTROINTESTINAL:   ABDOMEN:  Soft.   Nontender.    No masses.  Colostomy draining well.   SKIN:   INSPECTION:  Right buttock:  A healthy wound, but we have never been able to move this towards closure.   MUSCULOSKELETAL:   EXTREMITIES:  In a fixed extended and exorotated posture at the hips, also his ankles.  Flexion contractures of the knees.  Right had seems stable.   ASSESSMENT/PLAN:                 Chronic T11 paraplegia secondary to a fall in the 1970s.    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  The leaking urine has been a difficult issue that seems to even defy his urologist.     Iron deficiency.  He had a GI work-up in 2014 which did not find a source of this.  At one point, I thought this might have been chronic blood loss from his buttock ulcers, although this does not appear to be the case.  He is on iron.  I do not know what his reticulocyte count is.  Recent iron studies look like a continued iron deficiency.    Depression.  I must admit that I do not see this.  He is still followed by Psychiatry.    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  We are using silver alginate.  This is in the palliative care mode.    Carpal Tunnel Syndrome: I have used a brace on the right arm. This seems to have helped. I am not opposed to further studies and/or surgical referral if this worsens.

## 2015-04-08 ENCOUNTER — Other Ambulatory Visit: Payer: Self-pay | Admitting: *Deleted

## 2015-04-08 MED ORDER — ZOLPIDEM TARTRATE 10 MG PO TABS: 10.0000 mg | ORAL_TABLET | Freq: Every evening | ORAL | Status: DC | PRN

## 2015-04-08 NOTE — Telephone Encounter (Signed)
[facility] 

## 2015-05-06 ENCOUNTER — Non-Acute Institutional Stay (SKILLED_NURSING_FACILITY): Payer: Medicare Other | Admitting: Internal Medicine

## 2015-05-06 DIAGNOSIS — D509 Iron deficiency anemia, unspecified: Secondary | ICD-10-CM

## 2015-05-06 DIAGNOSIS — Z9350 Unspecified cystostomy status: Secondary | ICD-10-CM | POA: Diagnosis not present

## 2015-05-06 DIAGNOSIS — L89302 Pressure ulcer of unspecified buttock, stage 2: Secondary | ICD-10-CM

## 2015-05-06 DIAGNOSIS — Z9359 Other cystostomy status: Secondary | ICD-10-CM

## 2015-05-06 DIAGNOSIS — G8220 Paraplegia, unspecified: Secondary | ICD-10-CM

## 2015-05-06 NOTE — Progress Notes (Signed)
Patient ID: [name], male   DOB: Jul 08, 1951, 64 y.o.   MRN: [id number]                 PROGRESS NOTE  DATE:  05/06/2015        FACILITY: [facility]                     LEVEL OF CARE:   SNF   Routine Visit              CHIEF COMPLAINT:  Review of medical issues/[facility] visit/Review of opt [facility] documentation         HISTORY OF PRESENT ILLNESS:  [name] is a gentleman who came to [place] in 2006.    He has chronic T11 paraplegia.  This was as a result of a fall from a tree in the 1970s.   His lower extremities are in fixed extension and external rotation at all joints.    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  He went to see Plastic Surgery at [facility].  Paradoxically, his wound actually closed but the harvest sites on his right buttock did not.  I have never been able to get these sites to close.  I think the issue here is unrelieved pressure as well as leaking urine from his suprapubic catheter.  The patient is unable to roll from side to side.  Even though he has a pressure relief mattress, there is just too much pressure and sheer pressure on this wound site.    He is followed by Psychiatry in the facility for depression.  This has not been evident currently.    He had iron deficiency anemia.  Endoscopy and colonoscopy in 2014 did not show an obvious source.  His most recent hemoglobin was 9.7 on 11/08/2014.  This was stable.  His indices were microcytic and hypochromic.  White blood count and platelet count were normal.    As far as I can tell there've been no new issues.  CURRENT MEDICATIONS:  Medication list is reviewed.               Diamox 250, 2 tablets/500 daily.      Toprol XL 12.5 q.d.      Kcl 10 q.d.      Ditropan XL 20 mg daily (for bladder spasms, in spite of the suprapubic catheter).     Claritin 10 q.d.      ASA 81 q.d.     Abilify 5 mg daily (augmentation for depression, I believe).    Zantac  150 q.d.    Effexor 75 q.d.      Protonix 40 q.d.       MiraLAX 17 g daily.     Requip 1-1/2 tablets at night/1.5 mg.    Florastor 250 q.d.      Lasix 60 q.d.      Valproic acid 500 b.i.d.     Colace 200 b.i.d.     Senokot-S 8.6 b.i.d.     Ferrous sulfate 325 b.i.d.     Vitamin C 500 twice daily.    Percocet 7.5/325, 1 p.o. b.i.d.     Xalatan ophthalmic.     Ambien 5 mg q.h.s.      Vitamin D3, 50,000 U monthly.     LABORATORY DATA:  Last lab work:    Iron level 22, TIBC 282, percent saturation 8.  This was repeated with an  iron of 28 to TIBC of 301 and a percent saturation of 9 ferritin at 47  White count 15.4, hemoglobin 10.4, microcytic, hypochromic, RDW 17.7, platelet count 339.    REVIEW OF SYSTEMS:    General: He states he feels well CHEST/RESPIRATORY:  The patient is not complaining of shortness of breath.   CARDIAC:  No chest pain.   GI:  He has a colostomy in place.  No diarrhea.  NO bleeding in his ostomy bag GU:  Still complaining about voiding difficulties related to leaking urine.  We have sent him to Urology.  Even that does not seem to have helped here.     Extremities: Numbness and pain in the Right hand still with some discomfort Skin; discussed with the wound care nurse. Healthy granulation tissue over the skin harvest site there is a smaller area superiorly to this however none of this looks particularly ominous. Nor however as it progress towards healing Mental Status: States his depression is better.   PHYSICAL EXAMINATION:   GENERAL APPEARANCE:  The patient is not in any distress.    Vitals; blood pressure 135/64 respiratory rate 20 temperature 98.1 pulse 76 CHEST/RESPIRATORY:  Clear air entry bilaterally.    CARDIOVASCULAR:   CARDIAC:  Heart sounds are normal.   GASTROINTESTINAL:   ABDOMEN:  Soft.   Nontender.    No masses.  Colostomy draining well.   SKIN:   INSPECTION:  Right buttock:  A healthy wound, but we have never been able to  move this towards closure.  There is a smaller area superiorly to the site with some depth. MUSCULOSKELETAL:   EXTREMITIES:  In a fixed extended and exorotated posture at the hips, also his ankles.  Flexion contractures of the knees.  Right had seems stable. Few minor excoriations on the lateral left leg  ASSESSMENT/PLAN:                 Chronic T11 paraplegia secondary to a fall in the 1970s.    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  The leaking urine has been a difficult issue that seems to even defy his urologist.    Iron deficiency.  He had a GI work-up in 2014 which did not find a source of this.  At one point, I thought this might have been chronic blood loss from his buttock ulcers, although this does not appear to be the case.  He is on iron.  I do not know what his reticulocyte count is.  Recent iron studies look like a continued iron deficiency.    Depression.  I must admit that I do not see this.  He is still followed by Psychiatry.    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  We are using silver alginate.  This is in the palliative care mode.  I see no real change here in 2 months since I've seen him  Carpal Tunnel Syndrome: I have used a brace on the right arm. This seems to have helped. I am not opposed to further studies and/or surgical referral if this worsens.

## 2015-05-12 ENCOUNTER — Other Ambulatory Visit: Payer: Self-pay | Admitting: *Deleted

## 2015-05-12 MED ORDER — ZOLPIDEM TARTRATE 10 MG PO TABS: 10.0000 mg | ORAL_TABLET | Freq: Every evening | ORAL | Status: DC | PRN

## 2015-05-12 MED ORDER — OXYCODONE-ACETAMINOPHEN 7.5-325 MG PO TABS: ORAL_TABLET | ORAL | Status: DC

## 2015-05-12 NOTE — Telephone Encounter (Signed)
[facility] 

## 2015-06-10 ENCOUNTER — Non-Acute Institutional Stay (SKILLED_NURSING_FACILITY): Payer: Medicare Other | Admitting: Internal Medicine

## 2015-06-10 DIAGNOSIS — G8220 Paraplegia, unspecified: Secondary | ICD-10-CM | POA: Diagnosis not present

## 2015-06-10 DIAGNOSIS — D509 Iron deficiency anemia, unspecified: Secondary | ICD-10-CM | POA: Diagnosis not present

## 2015-06-10 DIAGNOSIS — Z9359 Other cystostomy status: Secondary | ICD-10-CM

## 2015-06-10 DIAGNOSIS — L89302 Pressure ulcer of unspecified buttock, stage 2: Secondary | ICD-10-CM | POA: Diagnosis not present

## 2015-06-16 ENCOUNTER — Other Ambulatory Visit: Payer: Self-pay | Admitting: *Deleted

## 2015-06-16 MED ORDER — OXYCODONE-ACETAMINOPHEN 7.5-325 MG PO TABS: ORAL_TABLET | ORAL | Status: DC

## 2015-06-16 MED ORDER — ZOLPIDEM TARTRATE 10 MG PO TABS: 10.0000 mg | ORAL_TABLET | Freq: Every evening | ORAL | Status: DC | PRN

## 2015-06-16 NOTE — Telephone Encounter (Signed)
[facility] 

## 2015-06-17 NOTE — Progress Notes (Signed)
Patient ID: [name], male   DOB: [birth year], 64 y.o.   MRN: [id number]                PROGRESS NOTE  DATE:  06/10/2015         FACILITY: [facility]           LEVEL OF CARE:   SNF   Routine Visit                CHIEF COMPLAINT:  Review of medical issues/[facility] visit/review of [employer] documentation.       HISTORY OF PRESENT ILLNESS:  Mr. Dirocco is a gentleman who came to [place] in 2006.    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  His lower extremities are in fixed extension and external rotation.    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  He went to see Plastic Surgery at [facility].  He had this wound closed and, paradoxically, this has remained closed whereas the skin harvest site nearby has remained open.   This situation is complicated by his relative immobility, his suprapubic catheter leaking constantly, and the fact that the wound area is surrounded by scar tissue.  Nevertheless, this has not gotten any worse.    He has iron deficiency anemia.  Work-up by GI in 2014 did not show an obvious source.    The patient tells me that he has had a recent viral illness with cough and sneezing.   This seems to be getting better.    PAST MEDICAL HISTORY/PROBLEM LIST:   Reviewed.       CURRENT MEDICATIONS:  Medication list is reviewed.      Diamox 500 mg daily.     Toprol XL 12.5 daily.    Potassium 10 mEq daily.    Multivitamin daily.     Ditropan XL 20 mg daily.    Claritin 10 q.d.      Abilify 5 q.d. (adjunct for depression).     Zantac 150 daily.     Effexor XR 75 q.d.      Protonix 40 q.d.      MiraLAX 17 g daily.     Systane ophthalmic.      Requip 1-1/2 tablets/1.5 mg q.h.s. (restless legs syndrome).     Florastor 250 b.i.d.      Lasix 60 b.i.d.      Artificial Tears.    Depakote 1 tablet twice daily.     Colace 100 b.i.d.      Senokot S b.i.d.     Ferrous sulfate 325 b.i.d.       Vitamin C 500 twice daily.     Percocet 7.5/325 b.i.d.      Xalatan ophthalmic into each eye at night.     Vitamin D3, 50,000 U monthly.     LABORATORY DATA:   Most recent lab work from earlier this month:    Iron 19, TIBC 295, percent saturation 6, ferritin level 47.     Hemoglobin 11.5, platelet count slightly elevated at 540,000.     All of this is quite compatible with iron deficiency anemia.  His hemoglobin is actually better.    REVIEW OF SYSTEMS:    GENERAL:  Getting over a cold.  He states he feels better.   CHEST/RESPIRATORY:  No shortness of breath.   CARDIAC:  No chest pain.   GI:  Has a colostomy in place.  Formed stool.  No blood obvious in the bag.    GU:  Still a leaking suprapubic catheter.  Urology did not have any further suggestions.    NEUROLOGICAL:  Extremities:  Still numbness in the right hand.  I think he has carpal tunnel syndrome.   SKIN:   His skin harvest sites from his buttocks are still open.  These seem to come and go.  Because of a multiplicity of issues listed above, I do not expect healing here.   PSYCHIATRIC:  Mental status:  If there is depression here, this is a lot better.      PHYSICAL EXAMINATION:   VITAL SIGNS:     TEMPERATURE:  98.5.    PULSE:  70.    RESPIRATIONS:  20.    BLOOD PRESSURE:  142/72.   02 SATURATIONS:  95%.    WEIGHT:  220 pounds, stable to increased.      CHEST/RESPIRATORY:  Clear air entry bilaterally.    CARDIOVASCULAR:   CARDIAC:  Heart sounds are normal.  There are no murmurs.    GASTROINTESTINAL:   ABDOMEN:  No masses.    Colostomy in the left lower quadrant looks fine.   LIVER/SPLEEN/KIDNEYS:  No liver, no spleen.  No tenderness.    GENITOURINARY:   BLADDER:  His suprapubic site looks stable.   SKIN:   INSPECTION:  He has a large area of superficial wound over his buttocks.  This fluctuates somewhat.  Does not look unhealthy.    ASSESSMENT/PLAN:                 Iron deficiency anemia.   I see no reason  to continue to do iron studies on this man.  His hemoglobin is improving.  He has had a GI work-up.     Urinary retention with a suprapubic catheter.  Leaking urine has been a difficult issue.  We really have not been able to get any help from even Urology.    Chronic T11 paraplegia.  Secondary to a fall in the 1970s.  He is contracted at the hips, knees, and ankles.     Depression.  I do not really see this currently.    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  We will use calcium alginate.  This is in the palliative treatment mode.  I do not think we are going to get this to close.     CPT CODE: [id number]

## 2015-07-18 ENCOUNTER — Other Ambulatory Visit: Payer: Self-pay | Admitting: *Deleted

## 2015-07-18 MED ORDER — ZOLPIDEM TARTRATE 10 MG PO TABS: 10.0000 mg | ORAL_TABLET | Freq: Every evening | ORAL | Status: DC | PRN

## 2015-07-18 NOTE — Telephone Encounter (Signed)
[facility] 

## 2015-08-13 LAB — CBC AND DIFFERENTIAL: Hemoglobin: 9.7 g/dL — AB (ref 13.5–17.5)

## 2015-08-14 ENCOUNTER — Other Ambulatory Visit: Payer: Self-pay | Admitting: *Deleted

## 2015-08-14 MED ORDER — ZOLPIDEM TARTRATE 10 MG PO TABS: 10.0000 mg | ORAL_TABLET | Freq: Every evening | ORAL | Status: DC | PRN

## 2015-08-14 NOTE — Telephone Encounter (Signed)
[facility] 

## 2015-08-15 ENCOUNTER — Other Ambulatory Visit: Payer: Self-pay | Admitting: *Deleted

## 2015-08-15 MED ORDER — OXYCODONE-ACETAMINOPHEN 7.5-325 MG PO TABS: ORAL_TABLET | ORAL | Status: DC

## 2015-08-15 NOTE — Telephone Encounter (Signed)
[facility] 

## 2015-08-25 ENCOUNTER — Other Ambulatory Visit: Payer: Self-pay | Admitting: *Deleted

## 2015-08-25 MED ORDER — OXYCODONE HCL 5 MG PO CAPS: ORAL_CAPSULE | ORAL | Status: DC

## 2015-08-25 NOTE — Telephone Encounter (Signed)
[facility] 

## 2015-09-15 ENCOUNTER — Other Ambulatory Visit: Payer: Self-pay | Admitting: *Deleted

## 2015-09-15 MED ORDER — OXYCODONE-ACETAMINOPHEN 7.5-325 MG PO TABS: ORAL_TABLET | ORAL | Status: DC

## 2015-09-15 NOTE — Telephone Encounter (Signed)
[facility] 

## 2015-09-17 ENCOUNTER — Other Ambulatory Visit: Payer: Self-pay | Admitting: *Deleted

## 2015-09-17 MED ORDER — ZOLPIDEM TARTRATE 10 MG PO TABS: 10.0000 mg | ORAL_TABLET | Freq: Every evening | ORAL | Status: DC | PRN

## 2015-09-17 NOTE — Telephone Encounter (Signed)
[facility] 

## 2015-09-29 ENCOUNTER — Encounter: Payer: Self-pay | Admitting: Internal Medicine

## 2015-09-29 NOTE — Progress Notes (Signed)
A user error has taken place: encounter opened in error, closed for administrative reasons.

## 2015-10-14 ENCOUNTER — Other Ambulatory Visit: Payer: Self-pay

## 2015-10-14 MED ORDER — OXYCODONE-ACETAMINOPHEN 7.5-325 MG PO TABS: ORAL_TABLET | ORAL | Status: DC

## 2015-10-14 NOTE — Telephone Encounter (Signed)
Prescription for oxycodone/apap 7.5/325 mg tablets printed and [facility] in [facility] for signing.

## 2015-10-17 LAB — CBC AND DIFFERENTIAL: HEMOGLOBIN: 9.5 g/dL — AB (ref 13.5–17.5)

## 2015-10-27 ENCOUNTER — Encounter: Payer: Self-pay | Admitting: Internal Medicine

## 2015-10-27 ENCOUNTER — Non-Acute Institutional Stay (SKILLED_NURSING_FACILITY): Payer: Medicare Other | Admitting: Internal Medicine

## 2015-10-27 DIAGNOSIS — M25512 Pain in left shoulder: Secondary | ICD-10-CM

## 2015-10-27 DIAGNOSIS — M25519 Pain in unspecified shoulder: Secondary | ICD-10-CM | POA: Insufficient documentation

## 2015-10-27 DIAGNOSIS — M25511 Pain in right shoulder: Secondary | ICD-10-CM | POA: Diagnosis not present

## 2015-10-27 NOTE — Progress Notes (Signed)
Patient ID: [name], male   DOB: 1951-05-22, 65 y.o.   MRN: [id number]  Location:  [facility] Room Number: 217B Place of Service:  SNF (31) Provider:  Kimber Relic, MD  Patient Care Team: Kimber Relic, MD as PCP - General (Internal Medicine)  Extended Emergency Contact Information Primary Emergency Contact: Merton Border, [place] Home Phone: [phone] Mobile Phone: [phone] Relation: Sister Secondary Emergency Contact: Renata Caprice [place] Home Phone: [phone] Mobile Phone: [phone] Relation: None  Code Status: Full Goals of care: Advanced Directive information Advanced Directives 10/27/2015  Does patient have an advance directive? No  Would patient like information on creating an advanced directive? -  Pre-existing out of facility DNR order (yellow form or pink MOST form) -     Chief Complaint  Patient presents with  . Acute Visit    HPI:  Pt is a 65 y.o. male seen today for an acute visit for    Past Medical History  Diagnosis Date  . Coronary artery disease   . CHF (congestive heart failure) (HCC)   . Paraplegia (HCC)   . GERD (gastroesophageal reflux disease)   . Seizure (HCC)   . Anemia   . Anxiety   . Hypertension   . Obesity   . Pneumonia   . Urinary tract infection   . Blood clotting disorder (HCC)   . Irritable bowel syndrome    Past Surgical History  Procedure Laterality Date  . Colonostomy    . Colon surgery      colonostomy  . Esophagogastroduodenoscopy (egd) with propofol N/A 05/25/2013    Procedure: ESOPHAGOGASTRODUODENOSCOPY (EGD) WITH PROPOFOL;  Surgeon: Graylin Shiver, MD;  Location: [facility];  Service: Endoscopy;  Laterality: N/A;  . Colonoscopy with propofol N/A 05/25/2013    Procedure: COLONOSCOPY WITH PROPOFOL;  Surgeon: Graylin Shiver, MD;  Location: [facility];  Service: Endoscopy;  Laterality: N/A;    Allergies    Allergen Reactions  . Ciprofloxacin Itching  . Minocycline Hcl Itching      Medication List       This list is accurate as of: 10/27/15  5:06 PM.  Always use your most recent med list.               ABILIFY PO  Take 7.5 mg by mouth at bedtime. For depressive psychosis     acetaZOLAMIDE 250 MG tablet  Commonly known as:  DIAMOX  Take 500 mg by mouth daily.     carboxymethylcellulose 1 % ophthalmic solution  Place 1 drop into both eyes 2 (two) times daily. For dry eye.     CERTA-VITE PO  Take 1 tablet by mouth daily.     divalproex 500 MG DR tablet  Commonly known as:  DEPAKOTE  Take 500 mg by mouth 2 (two) times daily.     docusate sodium 100 MG capsule  Commonly known as:  COLACE  Take 200 mg by mouth 2 (two) times daily.     ferrous sulfate 325 (65 FE) MG tablet  Take 325 mg by mouth 2 (two) times daily with a meal.     furosemide 20 MG tablet  Commonly known as:  LASIX  Take 60 mg by mouth 2 (two) times daily.     latanoprost 0.005 % ophthalmic solution  Commonly known as:  XALATAN  Place 1 drop into both eyes at  bedtime.     lidocaine 5 %  Commonly known as:  LIDODERM  Place 1 patch onto the skin daily. Remove & Discard patch within 12 hours or as directed by MD     loratadine 10 MG tablet  Commonly known as:  CLARITIN  Take 10 mg by mouth daily.     metoprolol succinate 25 MG 24 hr tablet  Commonly known as:  TOPROL-XL  Take 12.5 mg by mouth every morning.     NASAL SPRAY SALINE NA  Place into the nose daily. Pt uses 2 sprays in each nostril every day     oxybutynin 10 MG 24 hr tablet  Commonly known as:  DITROPAN-XL  Take 20 mg by mouth at bedtime.     oxyCODONE 5 MG immediate release tablet  Commonly known as:  Oxy IR/ROXICODONE  Take one tablet by mouth every 6 hours as needed for breakthrough pain     oxyCODONE-acetaminophen 7.5-325 MG tablet  Commonly known as:  PERCOCET  Take one tablet by mouth twice daily for pain DO NOT EXCEED 4 GM  OF TYLENOL IN 24 HOURS     pantoprazole 40 MG tablet  Commonly known as:  PROTONIX  Take 1 tablet (40 mg total) by mouth daily.     polyethylene glycol packet  Commonly known as:  MIRALAX / GLYCOLAX  Take 17 g by mouth daily.     potassium chloride 10 MEQ tablet  Commonly known as:  K-DUR,KLOR-CON  Take 10 mEq by mouth daily.     ranitidine 150 MG tablet  Commonly known as:  ZANTAC  Take 150 mg by mouth daily.     rOPINIRole 1 MG tablet  Commonly known as:  REQUIP  Take 1.5 mg by mouth at bedtime.     saccharomyces boulardii 250 MG capsule  Commonly known as:  FLORASTOR  Take 250 mg by mouth 2 (two) times daily.     sennosides-docusate sodium 8.6-50 MG tablet  Commonly known as:  SENOKOT-S  Take 1 tablet by mouth 2 (two) times daily.     SYSTANE OP  Place 1 drop into both eyes daily.     venlafaxine 75 MG tablet  Commonly known as:  EFFEXOR  Take 75 mg by mouth daily.     vitamin C 500 MG tablet  Commonly known as:  ASCORBIC ACID  Take 500 mg by mouth 2 (two) times daily.     Vitamin D (Ergocalciferol) 50000 units Caps capsule  Commonly known as:  DRISDOL  Take 50,000 Units by mouth every 30 (thirty) days.     zolpidem 10 MG tablet  Commonly known as:  AMBIEN  Take 10 mg by mouth at bedtime as needed for sleep.     zolpidem 10 MG tablet  Commonly known as:  AMBIEN  Take 1 tablet (10 mg total) by mouth at bedtime as needed for sleep.        Review of Systems  Constitutional: Negative for fever, activity change, appetite change, fatigue and unexpected weight change.       Chronically overweight  HENT: Negative for congestion, ear pain, hearing loss, rhinorrhea, sore throat, tinnitus, trouble swallowing and voice change.   Eyes:       Corrective lenses  Respiratory: Negative for cough, choking, chest tightness, shortness of breath and wheezing.   Cardiovascular: Negative for chest pain, palpitations and leg swelling.  Gastrointestinal: Negative for nausea,  abdominal pain, diarrhea, constipation and abdominal distention.  Colostomy. History of dysphagia since he would be  Endocrine: Negative for cold intolerance, heat intolerance, polydipsia, polyphagia and polyuria.  Genitourinary: Negative for dysuria, urgency, frequency and testicular pain.       Suprapubic catheter  Musculoskeletal: Positive for gait problem. Negative for myalgias, back pain, arthralgias and neck pain.       Bilateral shoulder plain pains. Worse on the right.  Skin: Negative for color change, pallor and rash.  Allergic/Immunologic: Negative.   Neurological: Negative for dizziness, tremors, syncope, speech difficulty, weakness, numbness and headaches.  Hematological: Negative for adenopathy. Does not bruise/bleed easily.  Psychiatric/Behavioral: Negative for hallucinations, behavioral problems, confusion, sleep disturbance and decreased concentration. The patient is not nervous/anxious.     Immunization History  Administered Date(s) Administered  . Influenza-Unspecified 06/11/2013, 05/19/2015  . Pneumococcal-Unspecified 10/11/2005   Pertinent  Health Maintenance Due  Topic Date Due  . PNA vac Low Risk Adult (1 of 2 - PCV13) 07/21/2015  . INFLUENZA VACCINE  03/09/2016  . COLONOSCOPY  05/26/2023     Filed Vitals:   10/27/15 1654  BP: 129/59  Pulse: 67  Temp: 97.6 F (36.4 C)  Resp: 18  Height: 6' (1.829 m)  Weight: 217 lb (98.431 kg)  SpO2: 94%   Body mass index is 29.42 kg/(m^2). Physical Exam  Constitutional: He is oriented to person, place, and time. He appears well-developed and well-nourished. No distress.  obese  HENT:  Right Ear: External ear normal.  Left Ear: External ear normal.  Nose: Nose normal.  Mouth/Throat: Oropharynx is clear and moist. No oropharyngeal exudate.  Eyes: Conjunctivae and EOM are normal. Pupils are equal, round, and reactive to light.  Neck: No JVD present. No tracheal deviation present. No thyromegaly present.    Cardiovascular: Normal rate, regular rhythm, normal heart sounds and intact distal pulses.  Exam reveals no gallop and no friction rub.   No murmur heard. Pulmonary/Chest: No respiratory distress. He has no wheezes. He has no rales. He exhibits no tenderness.  Abdominal: He exhibits no distension and no mass. There is no tenderness.  Colostomy and suprapubic catheter  Musculoskeletal: Normal range of motion. He exhibits no edema or tenderness.  Painful with abduction and rotation of either shoulder.  Lymphadenopathy:    He has no cervical adenopathy.  Neurological: He is alert and oriented to person, place, and time. He has normal reflexes. No cranial nerve deficit. Coordination normal.  Skin: No rash noted. No erythema. No pallor.  Psychiatric: He has a normal mood and affect. His behavior is normal. Judgment and thought content normal.    Labs reviewed: No results for input(s): NA, K, CL, CO2, GLUCOSE, BUN, CREATININE, CALCIUM, MG, PHOS in the last 8760 hours. No results for input(s): AST, ALT, ALKPHOS, BILITOT, PROT, ALBUMIN in the last 8760 hours. No results for input(s): WBC, NEUTROABS, HGB, HCT, MCV, PLT in the last 8760 hours. Lab Results  Component Value Date   TSH 3.207 07/20/2012    Assessment/Plan 1. Bilateral shoulder pain Aspercreme with 4% lidocaine patch each afternoon to the right shoulder. Remove old patch.

## 2015-11-13 ENCOUNTER — Other Ambulatory Visit: Payer: Self-pay

## 2015-11-13 MED ORDER — OXYCODONE-ACETAMINOPHEN 7.5-325 MG PO TABS: ORAL_TABLET | ORAL | Status: DC

## 2015-11-17 ENCOUNTER — Encounter: Payer: Self-pay | Admitting: Internal Medicine

## 2015-11-17 ENCOUNTER — Non-Acute Institutional Stay (SKILLED_NURSING_FACILITY): Payer: Medicare Other | Admitting: Internal Medicine

## 2015-11-17 DIAGNOSIS — R319 Hematuria, unspecified: Secondary | ICD-10-CM | POA: Insufficient documentation

## 2015-11-17 HISTORY — DX: Hematuria, unspecified: R31.9

## 2015-11-17 NOTE — Progress Notes (Signed)
Patient ID: [name], male   DOB: 1951-07-11, 65 y.o.   MRN: [id number]  Location:  [facility] Room Number: [id number] Place of Service:  SNF (31) Provider:  Kimber RelicGREEN, Shimshon Narula G, MD  Patient Care Team: Kimber RelicArthur G Jolan Mealor, MD as PCP - General (Internal Medicine)  Extended Emergency Contact Information Primary Emergency Contact: Merton Borderarter,Vivian          [place] Home Phone: [phone] Mobile Phone: [phone] Relation: Sister Secondary Emergency Contact: Renata Capriceuller,Linda  [place] Home Phone: [phone] Mobile Phone: [phone] Relation: None  Code Status:  full Goals of care: Advanced Directive information Advanced Directives 11/17/2015  Does patient have an advance directive? No  Would patient like information on creating an advanced directive? -  Pre-existing out of facility DNR order (yellow form or pink MOST form) -     Chief Complaint  Patient presents with  . Acute Visit    supra pubic cath bleeding     HPI:  Pt is a 65 y.o. male seen today for an acute visit for Hematuria. Patient has suprapubic catheter. He is subject to recurrent urinary tract infections. He is not on anticoagulation. He has mild discomfort in the lower abdomen..   Past Medical History  Diagnosis Date  . Coronary artery disease   . CHF (congestive heart failure) (HCC)   . Paraplegia (HCC)   . GERD (gastroesophageal reflux disease)   . Seizure (HCC)   . Anemia   . Anxiety   . Hypertension   . Obesity   . Pneumonia   . Urinary tract infection   . Blood clotting disorder (HCC)   . Irritable bowel syndrome   . Cellulitis   . Dysphagia, oral phase   . Neurogenic dysfunction of the urinary bladder   . Muscle weakness (generalized)    Past Surgical History  Procedure Laterality Date  . Colonostomy    . Colon surgery      colonostomy  . Esophagogastroduodenoscopy (egd) with propofol N/A 05/25/2013    Procedure:  ESOPHAGOGASTRODUODENOSCOPY (EGD) WITH PROPOFOL;  Surgeon: Graylin ShiverSalem F Ganem, MD;  Location: [facility];  Service: Endoscopy;  Laterality: N/A;  . Colonoscopy with propofol N/A 05/25/2013    Procedure: COLONOSCOPY WITH PROPOFOL;  Surgeon: Graylin ShiverSalem F Ganem, MD;  Location: [facility];  Service: Endoscopy;  Laterality: N/A;    Allergies  Allergen Reactions  . Ciprofloxacin Itching  . Minocycline Hcl Itching      Medication List       This list is accurate as of: 11/17/15  3:12 PM.  Always use your most recent med list.               ABILIFY PO  Take 7.5 mg by mouth at bedtime. For depressive psychosis     acetaZOLAMIDE 250 MG tablet  Commonly known as:  DIAMOX  Take 500 mg by mouth daily.     ASPERCREME EX  Apply topically. Apply to right shoulder every afternoon     BIOFREEZE EX  Apply 5 % topically. Apply topically to both shoulders twice daily     carboxymethylcellulose 1 % ophthalmic solution  Place 1 drop into both eyes 2 (two) times daily. For dry eye.     CERTA-VITE PO  Take 1 tablet by mouth daily.     divalproex 500 MG DR tablet  Commonly known as:  DEPAKOTE  Take 500 mg by mouth 2 (two) times daily.     docusate  sodium 100 MG capsule  Commonly known as:  COLACE  Take 200 mg by mouth 2 (two) times daily.     DUONEB IN  Inhale into the lungs. One vial via nebulizer every 6 hours as needed for wheezing     ferrous sulfate 325 (65 FE) MG tablet  Take 325 mg by mouth 2 (two) times daily with a meal.     furosemide 20 MG tablet  Commonly known as:  LASIX  Take 60 mg by mouth 2 (two) times daily.     latanoprost 0.005 % ophthalmic solution  Commonly known as:  XALATAN  Place 1 drop into both eyes at bedtime.     lidocaine 5 %  Commonly known as:  LIDODERM  Place 1 patch onto the skin daily. Remove & Discard patch within 12 hours or as directed by MD     loratadine 10 MG tablet  Commonly known as:  CLARITIN  Take 10 mg by mouth daily.     metoprolol  succinate 25 MG 24 hr tablet  Commonly known as:  TOPROL-XL  Take 12.5 mg by mouth every morning.     NASAL SPRAY SALINE NA  Place into the nose daily. Pt uses 2 sprays in each nostril every day     oxybutynin 10 MG 24 hr tablet  Commonly known as:  DITROPAN-XL  Take 20 mg by mouth at bedtime.     oxyCODONE 5 MG immediate release tablet  Commonly known as:  Oxy IR/ROXICODONE  Take one tablet by mouth every 6 hours as needed for breakthrough pain     oxyCODONE-acetaminophen 7.5-325 MG tablet  Commonly known as:  PERCOCET  Take one tablet by mouth twice daily for pain DO NOT EXCEED 4 GM OF TYLENOL IN 24 HOURS     pantoprazole 40 MG tablet  Commonly known as:  PROTONIX  Take 1 tablet (40 mg total) by mouth daily.     polyethylene glycol packet  Commonly known as:  MIRALAX / GLYCOLAX  Take 17 g by mouth daily.     potassium chloride 10 MEQ tablet  Commonly known as:  K-DUR,KLOR-CON  Take 10 mEq by mouth daily.     PROSTAT PO  Take by mouth. 60 cc twice daily     ranitidine 150 MG tablet  Commonly known as:  ZANTAC  Take 150 mg by mouth daily.     rOPINIRole 1 MG tablet  Commonly known as:  REQUIP  Take 1.5 mg by mouth at bedtime.     saccharomyces boulardii 250 MG capsule  Commonly known as:  FLORASTOR  Take 250 mg by mouth 2 (two) times daily.     sennosides-docusate sodium 8.6-50 MG tablet  Commonly known as:  SENOKOT-S  Take 1 tablet by mouth 2 (two) times daily.     SYSTANE OP  Place 1 drop into both eyes daily.     venlafaxine 75 MG tablet  Commonly known as:  EFFEXOR  Take 75 mg by mouth daily.     vitamin C 500 MG tablet  Commonly known as:  ASCORBIC ACID  Take 500 mg by mouth 2 (two) times daily.     Vitamin D (Ergocalciferol) 50000 units Caps capsule  Commonly known as:  DRISDOL  Take 50,000 Units by mouth every 30 (thirty) days.     zolpidem 10 MG tablet  Commonly known as:  AMBIEN  Take 10 mg by mouth at bedtime as needed for sleep.  zolpidem 10 MG tablet  Commonly known as:  AMBIEN  Take 1 tablet (10 mg total) by mouth at bedtime as needed for sleep.        Review of Systems  Constitutional: Negative for fever, activity change, appetite change, fatigue and unexpected weight change.       Chronically overweight  HENT: Negative for congestion, ear pain, hearing loss, rhinorrhea, sore throat, tinnitus, trouble swallowing and voice change.   Eyes:       Corrective lenses  Respiratory: Negative for cough, choking, chest tightness, shortness of breath and wheezing.   Cardiovascular: Negative for chest pain, palpitations and leg swelling.  Gastrointestinal: Negative for nausea, abdominal pain, diarrhea, constipation and abdominal distention.       Colostomy. History of dysphagia since he would be  Endocrine: Negative for cold intolerance, heat intolerance, polydipsia, polyphagia and polyuria.  Genitourinary: Negative for dysuria, urgency, frequency and testicular pain.       Suprapubic catheter. Hematuria. Lower abdominal discomfort.  Musculoskeletal: Positive for gait problem. Negative for myalgias, back pain, arthralgias and neck pain.       Bilateral shoulder plain pains. Worse on the right.  Skin: Negative for color change, pallor and rash.  Allergic/Immunologic: Negative.   Neurological: Negative for dizziness, tremors, syncope, speech difficulty, weakness, numbness and headaches.  Hematological: Negative for adenopathy. Does not bruise/bleed easily.  Psychiatric/Behavioral: Negative for hallucinations, behavioral problems, confusion, sleep disturbance and decreased concentration. The patient is not nervous/anxious.     Immunization History  Administered Date(s) Administered  . Influenza-Unspecified 06/11/2013, 05/19/2015  . Pneumococcal-Unspecified 10/11/2005   Pertinent  Health Maintenance Due  Topic Date Due  . PNA vac Low Risk Adult (1 of 2 - PCV13) 07/21/2015  . INFLUENZA VACCINE  03/09/2016  .  COLONOSCOPY  05/26/2023   No flowsheet data found. Functional Status Survey:    Filed Vitals:   11/17/15 1439  BP: 172/86  Pulse: 76  Temp: 99 F (37.2 C)  Resp: 20  Height: 6' (1.829 m)  Weight: 221 lb (100.245 kg)  SpO2: 96%   Body mass index is 29.97 kg/(m^2). Physical Exam  Constitutional: He is oriented to person, place, and time. He appears well-developed and well-nourished. No distress.  obese  HENT:  Right Ear: External ear normal.  Left Ear: External ear normal.  Nose: Nose normal.  Mouth/Throat: Oropharynx is clear and moist. No oropharyngeal exudate.  Eyes: Conjunctivae and EOM are normal. Pupils are equal, round, and reactive to light.  Neck: No JVD present. No tracheal deviation present. No thyromegaly present.  Cardiovascular: Normal rate, regular rhythm, normal heart sounds and intact distal pulses.  Exam reveals no gallop and no friction rub.   No murmur heard. Pulmonary/Chest: No respiratory distress. He has no wheezes. He has no rales. He exhibits no tenderness.  Abdominal: He exhibits no distension and no mass. There is no tenderness.  Colostomy and suprapubic catheter  Genitourinary:  Suprapubic catheter. Hematuria. Mild suprapubic discomfort.  Musculoskeletal: Normal range of motion. He exhibits no edema or tenderness.  Painful with abduction and rotation of either shoulder.  Lymphadenopathy:    He has no cervical adenopathy.  Neurological: He is alert and oriented to person, place, and time. He has normal reflexes. No cranial nerve deficit. Coordination normal.  Skin: No rash noted. No erythema. No pallor.  Psychiatric: He has a normal mood and affect. His behavior is normal. Judgment and thought content normal.    Labs reviewed: No results for input(s): NA, K, CL,  CO2, GLUCOSE, BUN, CREATININE, CALCIUM, MG, PHOS in the last 8760 hours. No results for input(s): AST, ALT, ALKPHOS, BILITOT, PROT, ALBUMIN in the last 8760 hours. No results for  input(s): WBC, NEUTROABS, HGB, HCT, MCV, PLT in the last 8760 hours. Lab Results  Component Value Date   TSH 3.207 07/20/2012   Assessment/Plan 1. Hematuria -Ordered cath urinalysis and culture. I will await results of the culture prior to starting antibiotics. He is only mildly uncomfortable right now

## 2015-11-18 LAB — CBC AND DIFFERENTIAL: Hemoglobin: 8.4 g/dL — AB (ref 13.5–17.5)

## 2015-11-28 LAB — CBC AND DIFFERENTIAL: HEMOGLOBIN: 7.8 g/dL — AB (ref 13.5–17.5)

## 2015-12-15 ENCOUNTER — Encounter (HOSPITAL_BASED_OUTPATIENT_CLINIC_OR_DEPARTMENT_OTHER): Payer: Medicare Other | Attending: Internal Medicine

## 2015-12-15 DIAGNOSIS — I509 Heart failure, unspecified: Secondary | ICD-10-CM | POA: Insufficient documentation

## 2015-12-15 DIAGNOSIS — D649 Anemia, unspecified: Secondary | ICD-10-CM | POA: Insufficient documentation

## 2015-12-15 DIAGNOSIS — G8220 Paraplegia, unspecified: Secondary | ICD-10-CM | POA: Insufficient documentation

## 2015-12-15 DIAGNOSIS — L89319 Pressure ulcer of right buttock, unspecified stage: Secondary | ICD-10-CM | POA: Insufficient documentation

## 2015-12-15 DIAGNOSIS — L97211 Non-pressure chronic ulcer of right calf limited to breakdown of skin: Secondary | ICD-10-CM | POA: Diagnosis not present

## 2015-12-15 DIAGNOSIS — L97811 Non-pressure chronic ulcer of other part of right lower leg limited to breakdown of skin: Secondary | ICD-10-CM | POA: Diagnosis not present

## 2015-12-15 DIAGNOSIS — L97511 Non-pressure chronic ulcer of other part of right foot limited to breakdown of skin: Secondary | ICD-10-CM | POA: Insufficient documentation

## 2015-12-15 DIAGNOSIS — L89612 Pressure ulcer of right heel, stage 2: Secondary | ICD-10-CM | POA: Diagnosis not present

## 2015-12-15 DIAGNOSIS — L89322 Pressure ulcer of left buttock, stage 2: Secondary | ICD-10-CM | POA: Diagnosis present

## 2015-12-15 DIAGNOSIS — Z933 Colostomy status: Secondary | ICD-10-CM | POA: Insufficient documentation

## 2015-12-17 LAB — HEMOGLOBIN A1C: Hemoglobin A1C: 5.4

## 2015-12-17 LAB — CBC AND DIFFERENTIAL: HEMOGLOBIN: 8.3 g/dL — AB (ref 13.5–17.5)

## 2015-12-29 ENCOUNTER — Encounter: Payer: Self-pay | Admitting: Internal Medicine

## 2015-12-29 DIAGNOSIS — D509 Iron deficiency anemia, unspecified: Secondary | ICD-10-CM

## 2015-12-29 DIAGNOSIS — L89322 Pressure ulcer of left buttock, stage 2: Secondary | ICD-10-CM | POA: Diagnosis not present

## 2016-01-09 ENCOUNTER — Encounter (HOSPITAL_BASED_OUTPATIENT_CLINIC_OR_DEPARTMENT_OTHER): Payer: Medicare Other | Attending: Internal Medicine

## 2016-01-09 DIAGNOSIS — I509 Heart failure, unspecified: Secondary | ICD-10-CM | POA: Diagnosis not present

## 2016-01-09 DIAGNOSIS — L97512 Non-pressure chronic ulcer of other part of right foot with fat layer exposed: Secondary | ICD-10-CM | POA: Diagnosis not present

## 2016-01-09 DIAGNOSIS — L97411 Non-pressure chronic ulcer of right heel and midfoot limited to breakdown of skin: Secondary | ICD-10-CM | POA: Diagnosis not present

## 2016-01-09 DIAGNOSIS — X58XXXA Exposure to other specified factors, initial encounter: Secondary | ICD-10-CM | POA: Insufficient documentation

## 2016-01-09 DIAGNOSIS — S31819A Unspecified open wound of right buttock, initial encounter: Secondary | ICD-10-CM | POA: Diagnosis not present

## 2016-01-09 DIAGNOSIS — S91301A Unspecified open wound, right foot, initial encounter: Secondary | ICD-10-CM | POA: Insufficient documentation

## 2016-01-09 DIAGNOSIS — L97211 Non-pressure chronic ulcer of right calf limited to breakdown of skin: Secondary | ICD-10-CM | POA: Insufficient documentation

## 2016-01-09 DIAGNOSIS — L97819 Non-pressure chronic ulcer of other part of right lower leg with unspecified severity: Secondary | ICD-10-CM | POA: Diagnosis not present

## 2016-01-09 DIAGNOSIS — G8220 Paraplegia, unspecified: Secondary | ICD-10-CM | POA: Insufficient documentation

## 2016-01-09 DIAGNOSIS — L89323 Pressure ulcer of left buttock, stage 3: Secondary | ICD-10-CM | POA: Diagnosis not present

## 2016-01-12 ENCOUNTER — Ambulatory Visit (HOSPITAL_COMMUNITY)
Admission: RE | Admit: 2016-01-12 | Discharge: 2016-01-12 | Disposition: A | Discharge status: Home / Self Care | Payer: Medicare Other | Source: Ambulatory Visit | Attending: Internal Medicine | Admitting: Internal Medicine

## 2016-01-12 ENCOUNTER — Encounter: Payer: Self-pay | Admitting: Internal Medicine

## 2016-01-12 ENCOUNTER — Non-Acute Institutional Stay (SKILLED_NURSING_FACILITY): Payer: Medicare Other | Admitting: Internal Medicine

## 2016-01-12 ENCOUNTER — Other Ambulatory Visit: Payer: Self-pay | Admitting: Internal Medicine

## 2016-01-12 DIAGNOSIS — Z22322 Carrier or suspected carrier of Methicillin resistant Staphylococcus aureus: Secondary | ICD-10-CM

## 2016-01-12 DIAGNOSIS — A4902 Methicillin resistant Staphylococcus aureus infection, unspecified site: Secondary | ICD-10-CM | POA: Diagnosis present

## 2016-01-12 DIAGNOSIS — L89893 Pressure ulcer of other site, stage 3: Secondary | ICD-10-CM

## 2016-01-12 DIAGNOSIS — L89899 Pressure ulcer of other site, unspecified stage: Secondary | ICD-10-CM | POA: Insufficient documentation

## 2016-01-12 DIAGNOSIS — L89153 Pressure ulcer of sacral region, stage 3: Secondary | ICD-10-CM | POA: Diagnosis not present

## 2016-01-12 DIAGNOSIS — D509 Iron deficiency anemia, unspecified: Secondary | ICD-10-CM

## 2016-01-12 MED ORDER — HEPARIN SOD (PORK) LOCK FLUSH 100 UNIT/ML IV SOLN
INTRAVENOUS | Status: AC
  Filled 2016-01-12: qty 5

## 2016-01-12 MED ORDER — LIDOCAINE HCL 1 % IJ SOLN
INTRAMUSCULAR | Status: AC
  Administered 2016-01-12: 10 mL
  Filled 2016-01-12: qty 20

## 2016-01-12 MED ORDER — IOPAMIDOL (ISOVUE-300) INJECTION 61%
INTRAVENOUS | Status: AC
  Administered 2016-01-12: 5 mL
  Filled 2016-01-12: qty 50

## 2016-01-12 NOTE — Procedures (Signed)
R arm PowerPICC placed under US and fluoroscopy No ptx on spot chest radiograph. No complication No blood loss. See complete dictation in [facility] PACS.  

## 2016-01-12 NOTE — Progress Notes (Signed)
Patient ID: [name], male   DOB: 08/08/1951, 65 y.o.   MRN: [id number]  Location:  [facility] Room Number: [id number] Place of Service:  SNF (31) Provider: Kimber Relic, MD  Patient Care Team: Kimber Relic, MD as PCP - General (Internal Medicine)  Extended Emergency Contact Information Primary Emergency Contact: Merton Border, [place] Home Phone: [phone] Mobile Phone: [phone] Relation: Sister Secondary Emergency Contact: Renata [place] Home Phone: [phone] Mobile Phone: [phone] Relation: None  Code Status:  full Goals of care: Advanced Directive information Advanced Directives 01/12/2016  Does patient have an advance directive? No  Would patient like information on creating an advanced directive? -  Pre-existing out of facility DNR order (yellow form or pink MOST form) -     Chief Complaint  Patient presents with  . Acute Visit    wound care    HPI:  Pt is a 65 y.o. male seen today for an acute visit for Reevaluation of sacral decubiti. Patient is seen at the care center by Dr. Leanord Hawking. Last visit was 01/09/2016 Dr. Leanord Hawking sent today note back. His wounds were deteriorating. He was growing methicillin sensitive staph aureus and the culture areas of the ulcers on the gluteal areas and his right foot. Dr. Leanord Hawking recommended intravenous Ancef 1 g IV every 8 hours for 6 weeks is recommended PICC line. He also recommended silver alginate to all wound areas, found bandages of the buttocks and Kerlix with coned-down right leg and right foot. Previously used Bactrim was to be discontinued once the PICC line was in place.  Patient describes the ulcerated areas as painful.   Past Medical History  Diagnosis Date  . Coronary artery disease   . CHF (congestive heart failure) (HCC)   . Paraplegia (HCC)   . GERD (gastroesophageal reflux disease)   . Seizure (HCC)   .  Anemia   . Anxiety   . Hypertension   . Obesity   . Pneumonia   . Urinary tract infection   . Blood clotting disorder (HCC)   . Irritable bowel syndrome   . Cellulitis   . Dysphagia, oral phase   . Neurogenic dysfunction of the urinary bladder   . Muscle weakness (generalized)   . Hematuria 11/17/2015   Past Surgical History  Procedure Laterality Date  . Colonostomy    . Colon surgery      colonostomy  . Esophagogastroduodenoscopy (egd) with propofol N/A 05/25/2013    Procedure: ESOPHAGOGASTRODUODENOSCOPY (EGD) WITH PROPOFOL;  Surgeon: Graylin Shiver, MD;  Location: [facility];  Service: Endoscopy;  Laterality: N/A;  . Colonoscopy with propofol N/A 05/25/2013    Procedure: COLONOSCOPY WITH PROPOFOL;  Surgeon: Graylin Shiver, MD;  Location: [facility];  Service: Endoscopy;  Laterality: N/A;    Allergies  Allergen Reactions  . Ciprofloxacin Itching  . Minocycline Hcl Itching      Medication List       This list is accurate as of: 01/12/16 12:30 PM.  Always use your most recent med list.               ABILIFY PO  Take 7.5 mg by mouth at bedtime. For depressive psychosis     acetaZOLAMIDE 250 MG tablet  Commonly known as:  DIAMOX  Take 500 mg by mouth daily.     ASPERCREME EX  Apply topically. Apply to right shoulder  every afternoon     BIOFREEZE EX  Apply 5 % topically. Apply topically to both shoulders twice daily     carboxymethylcellulose 1 % ophthalmic solution  Place 1 drop into both eyes 2 (two) times daily. For dry eye.     CERTA-VITE PO  Take 1 tablet by mouth daily.     divalproex 500 MG DR tablet  Commonly known as:  DEPAKOTE  Take 500 mg by mouth 2 (two) times daily.     docusate sodium 100 MG capsule  Commonly known as:  COLACE  Take 200 mg by mouth 2 (two) times daily.     DUONEB IN  Inhale into the lungs. One vial via nebulizer every 6 hours as needed for wheezing     ferrous sulfate 325 (65 FE) MG tablet  Take 325 mg by mouth 2 (two)  times daily with a meal.     furosemide 20 MG tablet  Commonly known as:  LASIX  Take 60 mg by mouth 2 (two) times daily.     latanoprost 0.005 % ophthalmic solution  Commonly known as:  XALATAN  Place 1 drop into both eyes at bedtime.     lidocaine 5 %  Commonly known as:  LIDODERM  Place 1 patch onto the skin daily. Remove & Discard patch within 12 hours or as directed by MD     loratadine 10 MG tablet  Commonly known as:  CLARITIN  Take 10 mg by mouth daily.     metoprolol succinate 25 MG 24 hr tablet  Commonly known as:  TOPROL-XL  Take 12.5 mg by mouth every morning.     NASAL SPRAY SALINE NA  Place into the nose daily. Pt uses 2 sprays in each nostril every day     oxybutynin 10 MG 24 hr tablet  Commonly known as:  DITROPAN-XL  Take 10 mg by mouth. Take 2 tablets daily for bladder     oxyCODONE 5 MG immediate release tablet  Commonly known as:  Oxy IR/ROXICODONE  Take one tablet by mouth every 6 hours as needed for breakthrough pain     oxyCODONE-acetaminophen 7.5-325 MG tablet  Commonly known as:  PERCOCET  Take one tablet by mouth twice daily for pain DO NOT EXCEED 4 GM OF TYLENOL IN 24 HOURS     pantoprazole 40 MG tablet  Commonly known as:  PROTONIX  Take 1 tablet (40 mg total) by mouth daily.     polyethylene glycol packet  Commonly known as:  MIRALAX / GLYCOLAX  Take 17 g by mouth daily.     potassium chloride 10 MEQ tablet  Commonly known as:  K-DUR,KLOR-CON  Take 10 mEq by mouth daily.     PROSTAT PO  Take by mouth. 60 cc twice daily     ranitidine 150 MG tablet  Commonly known as:  ZANTAC  Take 150 mg by mouth daily.     rOPINIRole 1 MG tablet  Commonly known as:  REQUIP  Take 1.5 mg by mouth at bedtime.     saccharomyces boulardii 250 MG capsule  Commonly known as:  FLORASTOR  Take 250 mg by mouth 2 (two) times daily.     sennosides-docusate sodium 8.6-50 MG tablet  Commonly known as:  SENOKOT-S  Take 1 tablet by mouth 2 (two) times  daily.     SYSTANE OP  Place 1 drop into both eyes daily.     venlafaxine 75 MG tablet  Commonly known as:  EFFEXOR  Take 75 mg by mouth daily.     vitamin C 500 MG tablet  Commonly known as:  ASCORBIC ACID  Take 500 mg by mouth 2 (two) times daily.     Vitamin D (Ergocalciferol) 50000 units Caps capsule  Commonly known as:  DRISDOL  Take 50,000 Units by mouth every 30 (thirty) days.     zolpidem 10 MG tablet  Commonly known as:  AMBIEN  Take 10 mg by mouth at bedtime as needed for sleep.     zolpidem 10 MG tablet  Commonly known as:  AMBIEN  Take 1 tablet (10 mg total) by mouth at bedtime as needed for sleep.        Review of Systems  Constitutional: Negative for fever, activity change, appetite change, fatigue and unexpected weight change.       Chronically overweight  HENT: Negative for congestion, ear pain, hearing loss, rhinorrhea, sore throat, tinnitus, trouble swallowing and voice change.   Eyes:       Corrective lenses  Respiratory: Negative for cough, choking, chest tightness, shortness of breath and wheezing.   Cardiovascular: Negative for chest pain, palpitations and leg swelling.  Gastrointestinal: Negative for nausea, abdominal pain, diarrhea, constipation and abdominal distention.       Colostomy. History of dysphagia since he would be  Endocrine: Negative for cold intolerance, heat intolerance, polydipsia, polyphagia and polyuria.  Genitourinary: Negative for dysuria, urgency, frequency and testicular pain.       Suprapubic catheter. Hematuria. Lower abdominal discomfort.  Musculoskeletal: Positive for gait problem. Negative for myalgias, back pain, arthralgias and neck pain.       Bilateral shoulder plain pains. Worse on the right. Patient does not walk anymore.  Skin: Positive for wound (Deteriorating sacral decubiti). Negative for color change, pallor and rash.  Allergic/Immunologic: Negative.   Neurological: Negative for dizziness, tremors, syncope,  speech difficulty, weakness, numbness and headaches.  Hematological: Negative for adenopathy. Does not bruise/bleed easily.       Chronic iron deficiency anemia. Possibly related to hematuria.  Psychiatric/Behavioral: Negative for hallucinations, behavioral problems, confusion, sleep disturbance and decreased concentration. The patient is not nervous/anxious.     Immunization History  Administered Date(s) Administered  . Influenza-Unspecified 06/11/2013, 05/19/2015  . Pneumococcal-Unspecified 10/11/2005   Pertinent  Health Maintenance Due  Topic Date Due  . PNA vac Low Risk Adult (1 of 2 - PCV13) 07/21/2015  . INFLUENZA VACCINE  03/09/2016  . COLONOSCOPY  05/26/2023   No flowsheet data found. Functional Status Survey:    Filed Vitals:   01/12/16 1212  BP: 137/67  Pulse: 75  Temp: 98.2 F (36.8 C)  Resp: 19  Height: 6' (1.829 m)  Weight: 217 lb (98.431 kg)  SpO2: 96%   Body mass index is 29.42 kg/(m^2). Physical Exam  Constitutional: He is oriented to person, place, and time. He appears well-developed and well-nourished. No distress.  obese  HENT:  Right Ear: External ear normal.  Left Ear: External ear normal.  Nose: Nose normal.  Mouth/Throat: Oropharynx is clear and moist. No oropharyngeal exudate.  Eyes: Conjunctivae and EOM are normal. Pupils are equal, round, and reactive to light.  Neck: No JVD present. No tracheal deviation present. No thyromegaly present.  Cardiovascular: Normal rate, regular rhythm, normal heart sounds and intact distal pulses.  Exam reveals no gallop and no friction rub.   No murmur heard. Pulmonary/Chest: No respiratory distress. He has no wheezes. He has no rales. He exhibits no tenderness.  Abdominal: He exhibits no  distension and no mass. There is no tenderness.  Colostomy and suprapubic catheter  Genitourinary:  Suprapubic catheter. Hematuria. Mild suprapubic discomfort.  Musculoskeletal: Normal range of motion. He exhibits no edema or  tenderness.  Painful with abduction and rotation of either shoulder. Confined to bed and wheelchair.  Lymphadenopathy:    He has no cervical adenopathy.  Neurological: He is alert and oriented to person, place, and time. He has normal reflexes. No cranial nerve deficit. Coordination normal.  Skin: No rash noted. No erythema. No pallor.  Decubitus ulcers at buttocks and sacrum. Decubitus ulcer right calf and heel.  Psychiatric: He has a normal mood and affect. His behavior is normal. Judgment and thought content normal.    Labs reviewed: No results for input(s): NA, K, CL, CO2, GLUCOSE, BUN, CREATININE, CALCIUM, MG, PHOS in the last 8760 hours. No results for input(s): AST, ALT, ALKPHOS, BILITOT, PROT, ALBUMIN in the last 8760 hours.  Recent Labs  11/18/15 11/28/15 12/17/15  HGB 8.4* 7.8* 8.3*   Lab Results  Component Value Date   TSH 3.207 07/20/2012   Lab Results  Component Value Date   HGBA1C 5.4 12/17/2015    12/17/15 CBC w/Diff & PLT White Blood Cell (WBC) 6.4 k/uL 3.8-10.8 Final Red Blood Cell (RBC) 3.7 M/uL 4.3-6.0 L Final Hemoglobin (HGB) 8.3 g/dL 40.9-81.1 L Final Hematocrit (HCT) 31.3 % 39.0-54.0 L Final Mean Corpuscular Volume (MCV) 85.5 fL 79.0-100.0 Final Mean Corpuscular HGB (MCH) 22.7 pg 26.8-33.2 L Final Mean Corpuscular HGB Conc (MCHC) 26.5 g/dL 91.4-78.2 L Final RBC Dist Width (RDW) 18.6 % 9.0-15.0 H Final Platelet 373 k/uL 150-450 Final Neutrophils% 63.8 % 50.0-80.0 Final Lymphocytes% 19.2 % 21.0-51.0 L Final Monocytes% 12.5 % 2.0-12.0 H Final Eosinophils% 3.8 % 0.0-5.0 Final Basophils% 0.2 % 0.0-2.0 Final Neutrophils# 4.1 k/uL 1.5-6.6 Final Lymphocytes # 1.2 k/uL 1.5-3.5 L Final Monocytes # 0.8 k/uL 0.1-1.0 Final Eosinophils# 0.2 k/uL 0.0-0.6 Final Basophils# 0.0 k/uL 0.0-0.1 Final  Ferritin 40.9 ng/mL 23.9-336.2 Final  Iron, TIBC, %Sat Iron, Total 16 ug/dL 95-621 L Final Iron, Unsaturated 236 ug/dL 308-657 Final Total Iron Binding Capacity  252 ug/dL 846-962 Final Saturation Iron 6 % 20-55 L   12/31/15 Reticulocyte Count Reticulocyte 3.4 % 0.0-2.0 H Final Reticulocyte Count See  CBC w/Diff & PLT  White Blood Cell (WBC) 8.0 see comments k/uL 3.8-10.8 Final Red Blood Cell (RBC) 3.9 M/uL 4.3-6.0 L Final Hemoglobin (HGB) 8.7 g/dL 95.2-84.1 L Final Hematocrit (HCT) 32.0 % 39.0-54.0 L Final Mean Corpuscular Volume (MCV) 82.5 fL 79.0-100.0 Final Mean Corpuscular HGB (MCH) 22.4 pg 26.8-33.2 L Final Mean Corpuscular HGB Conc (MCHC) 27.2 g/dL 32.4-40.1 L Final RBC Dist Width (RDW) 19.1 % 9.0-15.0 H Final Platelet 534 k/uL 150-450 H Final Neutrophils% 67.8 % 50.0-80.0 Final Lymphocytes% 14.1 % 21.0-51.0 L Final Monocytes% 14.4 % 2.0-12.0 H Final Eosinophils% 3.0 % 0.0-5.0 Final Basophils% 0.3 % 0.0-2.0 Final Neutrophils# 5.4 k/uL 1.5-6.6 Final Lymphocytes # 1.1 k/uL 1.5-3.5 L Final Monocytes # 1.2 k/uL 0.1-1.0 H Final Eosinophils# 0.2 k/uL 0.0-0.6 Final Basophils# 0.0 k/uL 0.0-0.1 Final   Assessment/Plan  1. Decubitus ulcer of coccygeal region, stage III (HCC) I examined the wound. It is deteriorating. Orders will be followed as noted above.  2. Iron deficiency anemia Hemoglobin has increased from 8.3-8.7 g percent. Reticulocyte count is elevated. He will continue on current iron dosage.

## 2016-01-14 ENCOUNTER — Other Ambulatory Visit: Payer: Self-pay | Admitting: *Deleted

## 2016-01-14 MED ORDER — OXYCODONE-ACETAMINOPHEN 7.5-325 MG PO TABS: ORAL_TABLET | ORAL | Status: DC

## 2016-01-14 NOTE — Telephone Encounter (Signed)
[facility] 

## 2016-01-19 DIAGNOSIS — L97512 Non-pressure chronic ulcer of other part of right foot with fat layer exposed: Secondary | ICD-10-CM | POA: Diagnosis not present

## 2016-02-02 DIAGNOSIS — L97512 Non-pressure chronic ulcer of other part of right foot with fat layer exposed: Secondary | ICD-10-CM | POA: Diagnosis not present

## 2016-02-16 ENCOUNTER — Encounter (HOSPITAL_BASED_OUTPATIENT_CLINIC_OR_DEPARTMENT_OTHER): Payer: Medicare Other | Attending: Internal Medicine

## 2016-02-16 DIAGNOSIS — G8220 Paraplegia, unspecified: Secondary | ICD-10-CM | POA: Insufficient documentation

## 2016-02-16 DIAGNOSIS — L89612 Pressure ulcer of right heel, stage 2: Secondary | ICD-10-CM | POA: Insufficient documentation

## 2016-02-16 DIAGNOSIS — L89892 Pressure ulcer of other site, stage 2: Secondary | ICD-10-CM | POA: Diagnosis present

## 2016-02-16 DIAGNOSIS — L97811 Non-pressure chronic ulcer of other part of right lower leg limited to breakdown of skin: Secondary | ICD-10-CM | POA: Diagnosis not present

## 2016-02-16 DIAGNOSIS — I509 Heart failure, unspecified: Secondary | ICD-10-CM | POA: Insufficient documentation

## 2016-02-16 DIAGNOSIS — Y832 Surgical operation with anastomosis, bypass or graft as the cause of abnormal reaction of the patient, or of later complication, without mention of misadventure at the time of the procedure: Secondary | ICD-10-CM | POA: Insufficient documentation

## 2016-02-16 DIAGNOSIS — D649 Anemia, unspecified: Secondary | ICD-10-CM | POA: Insufficient documentation

## 2016-02-16 DIAGNOSIS — L89323 Pressure ulcer of left buttock, stage 3: Secondary | ICD-10-CM | POA: Insufficient documentation

## 2016-02-16 DIAGNOSIS — T86828 Other complications of skin graft (allograft) (autograft): Secondary | ICD-10-CM | POA: Insufficient documentation

## 2016-03-01 DIAGNOSIS — L89892 Pressure ulcer of other site, stage 2: Secondary | ICD-10-CM | POA: Diagnosis not present

## 2016-03-29 ENCOUNTER — Encounter (HOSPITAL_BASED_OUTPATIENT_CLINIC_OR_DEPARTMENT_OTHER): Payer: Medicare Other | Attending: Internal Medicine

## 2016-03-29 DIAGNOSIS — G8220 Paraplegia, unspecified: Secondary | ICD-10-CM | POA: Insufficient documentation

## 2016-03-29 DIAGNOSIS — L89612 Pressure ulcer of right heel, stage 2: Secondary | ICD-10-CM | POA: Insufficient documentation

## 2016-03-29 DIAGNOSIS — L98411 Non-pressure chronic ulcer of buttock limited to breakdown of skin: Secondary | ICD-10-CM | POA: Insufficient documentation

## 2016-03-29 DIAGNOSIS — L89890 Pressure ulcer of other site, unstageable: Secondary | ICD-10-CM | POA: Diagnosis present

## 2016-03-29 DIAGNOSIS — I509 Heart failure, unspecified: Secondary | ICD-10-CM | POA: Diagnosis not present

## 2016-03-29 DIAGNOSIS — L89323 Pressure ulcer of left buttock, stage 3: Secondary | ICD-10-CM | POA: Diagnosis not present

## 2016-04-26 ENCOUNTER — Encounter (HOSPITAL_BASED_OUTPATIENT_CLINIC_OR_DEPARTMENT_OTHER): Payer: Medicare Other | Attending: Internal Medicine

## 2016-04-26 DIAGNOSIS — G8220 Paraplegia, unspecified: Secondary | ICD-10-CM | POA: Diagnosis not present

## 2016-04-26 DIAGNOSIS — X58XXXA Exposure to other specified factors, initial encounter: Secondary | ICD-10-CM | POA: Diagnosis not present

## 2016-04-26 DIAGNOSIS — L89323 Pressure ulcer of left buttock, stage 3: Secondary | ICD-10-CM | POA: Diagnosis not present

## 2016-04-26 DIAGNOSIS — T86828 Other complications of skin graft (allograft) (autograft): Secondary | ICD-10-CM | POA: Diagnosis not present

## 2016-04-26 DIAGNOSIS — L89890 Pressure ulcer of other site, unstageable: Secondary | ICD-10-CM | POA: Insufficient documentation

## 2016-04-26 DIAGNOSIS — L89892 Pressure ulcer of other site, stage 2: Secondary | ICD-10-CM | POA: Insufficient documentation

## 2016-04-26 DIAGNOSIS — S31819A Unspecified open wound of right buttock, initial encounter: Secondary | ICD-10-CM | POA: Diagnosis not present

## 2016-04-26 DIAGNOSIS — I509 Heart failure, unspecified: Secondary | ICD-10-CM | POA: Diagnosis not present

## 2016-04-26 DIAGNOSIS — L89612 Pressure ulcer of right heel, stage 2: Secondary | ICD-10-CM | POA: Diagnosis not present

## 2016-04-26 DIAGNOSIS — Y832 Surgical operation with anastomosis, bypass or graft as the cause of abnormal reaction of the patient, or of later complication, without mention of misadventure at the time of the procedure: Secondary | ICD-10-CM | POA: Insufficient documentation

## 2016-10-04 ENCOUNTER — Inpatient Hospital Stay (HOSPITAL_COMMUNITY)
Admission: EM | Admit: 2016-10-04 | Discharge: 2016-10-06 | DRG: 812 | Disposition: A | Discharge status: Skilled Nursing Facility | Payer: Medicare Other | Attending: Family Medicine | Admitting: Family Medicine

## 2016-10-04 ENCOUNTER — Encounter (HOSPITAL_COMMUNITY): Payer: Self-pay

## 2016-10-04 DIAGNOSIS — K589 Irritable bowel syndrome without diarrhea: Secondary | ICD-10-CM | POA: Diagnosis present

## 2016-10-04 DIAGNOSIS — I110 Hypertensive heart disease with heart failure: Secondary | ICD-10-CM | POA: Diagnosis present

## 2016-10-04 DIAGNOSIS — L89159 Pressure ulcer of sacral region, unspecified stage: Secondary | ICD-10-CM | POA: Diagnosis present

## 2016-10-04 DIAGNOSIS — D509 Iron deficiency anemia, unspecified: Secondary | ICD-10-CM | POA: Diagnosis present

## 2016-10-04 DIAGNOSIS — E669 Obesity, unspecified: Secondary | ICD-10-CM | POA: Diagnosis present

## 2016-10-04 DIAGNOSIS — D649 Anemia, unspecified: Secondary | ICD-10-CM | POA: Insufficient documentation

## 2016-10-04 DIAGNOSIS — Z933 Colostomy status: Secondary | ICD-10-CM

## 2016-10-04 DIAGNOSIS — Z9359 Other cystostomy status: Secondary | ICD-10-CM

## 2016-10-04 DIAGNOSIS — Z9889 Other specified postprocedural states: Secondary | ICD-10-CM | POA: Diagnosis not present

## 2016-10-04 DIAGNOSIS — Z79899 Other long term (current) drug therapy: Secondary | ICD-10-CM | POA: Diagnosis not present

## 2016-10-04 DIAGNOSIS — E663 Overweight: Secondary | ICD-10-CM | POA: Diagnosis present

## 2016-10-04 DIAGNOSIS — Z808 Family history of malignant neoplasm of other organs or systems: Secondary | ICD-10-CM

## 2016-10-04 DIAGNOSIS — K219 Gastro-esophageal reflux disease without esophagitis: Secondary | ICD-10-CM | POA: Diagnosis present

## 2016-10-04 DIAGNOSIS — I2510 Atherosclerotic heart disease of native coronary artery without angina pectoris: Secondary | ICD-10-CM | POA: Diagnosis present

## 2016-10-04 DIAGNOSIS — Z8249 Family history of ischemic heart disease and other diseases of the circulatory system: Secondary | ICD-10-CM

## 2016-10-04 DIAGNOSIS — G40909 Epilepsy, unspecified, not intractable, without status epilepticus: Secondary | ICD-10-CM

## 2016-10-04 DIAGNOSIS — Z6827 Body mass index (BMI) 27.0-27.9, adult: Secondary | ICD-10-CM

## 2016-10-04 DIAGNOSIS — Z7401 Bed confinement status: Secondary | ICD-10-CM

## 2016-10-04 DIAGNOSIS — Z888 Allergy status to other drugs, medicaments and biological substances status: Secondary | ICD-10-CM

## 2016-10-04 DIAGNOSIS — I10 Essential (primary) hypertension: Secondary | ICD-10-CM | POA: Diagnosis present

## 2016-10-04 DIAGNOSIS — G8220 Paraplegia, unspecified: Secondary | ICD-10-CM | POA: Diagnosis present

## 2016-10-04 DIAGNOSIS — I509 Heart failure, unspecified: Secondary | ICD-10-CM | POA: Diagnosis present

## 2016-10-04 DIAGNOSIS — R531 Weakness: Secondary | ICD-10-CM

## 2016-10-04 DIAGNOSIS — L89150 Pressure ulcer of sacral region, unstageable: Secondary | ICD-10-CM | POA: Diagnosis not present

## 2016-10-04 LAB — BASIC METABOLIC PANEL
ANION GAP: 7 (ref 5–15)
BUN: 53 mg/dL — AB (ref 6–20)
CALCIUM: 8.8 mg/dL — AB (ref 8.9–10.3)
CO2: 33 mmol/L — AB (ref 22–32)
Chloride: 97 mmol/L — ABNORMAL LOW (ref 101–111)
Creatinine, Ser: 0.84 mg/dL (ref 0.61–1.24)
GFR calc Af Amer: >60 mL/min (ref >60)
GLUCOSE: 114 mg/dL — AB (ref 65–99)
POTASSIUM: 3.4 mmol/L — AB (ref 3.5–5.1)
Sodium: 137 mmol/L (ref 135–145)

## 2016-10-04 LAB — URINALYSIS, ROUTINE W REFLEX MICROSCOPIC
Bilirubin Urine: NEGATIVE
Glucose, UA: NEGATIVE mg/dL
KETONES UR: NEGATIVE mg/dL
Nitrite: POSITIVE — AB
PROTEIN: 30 mg/dL — AB
Specific Gravity, Urine: 1.006 (ref 1.005–1.030)
pH: 8 (ref 5.0–8.0)

## 2016-10-04 LAB — CBC
HEMATOCRIT: 22.7 % — AB (ref 39.0–52.0)
HEMOGLOBIN: 5.9 g/dL — AB (ref 13.0–17.0)
MCH: 18.4 pg — AB (ref 26.0–34.0)
MCHC: 26 g/dL — AB (ref 30.0–36.0)
MCV: 70.7 fL — ABNORMAL LOW (ref 78.0–100.0)
Platelets: 599 10*3/uL — ABNORMAL HIGH (ref 150–400)
RBC: 3.21 MIL/uL — ABNORMAL LOW (ref 4.22–5.81)
RDW: 19.6 % — AB (ref 11.5–15.5)
WBC: 10.2 10*3/uL (ref 4.0–10.5)

## 2016-10-04 LAB — PREPARE RBC (CROSSMATCH)

## 2016-10-04 LAB — CBG MONITORING, ED: GLUCOSE-CAPILLARY: 122 mg/dL — AB (ref 65–99)

## 2016-10-04 MED ORDER — SACCHAROMYCES BOULARDII 250 MG PO CAPS
250.0000 mg | ORAL_CAPSULE | Freq: Two times a day (BID) | ORAL | Status: DC
  Administered 2016-10-04 – 2016-10-06 (×4): 250 mg via ORAL
  Filled 2016-10-04 (×4): qty 1

## 2016-10-04 MED ORDER — ACETAMINOPHEN 650 MG RE SUPP: 650.0000 mg | Freq: Four times a day (QID) | RECTAL | Status: DC | PRN

## 2016-10-04 MED ORDER — ONDANSETRON HCL 4 MG PO TABS: 4.0000 mg | ORAL_TABLET | Freq: Four times a day (QID) | ORAL | Status: DC | PRN

## 2016-10-04 MED ORDER — FUROSEMIDE 20 MG PO TABS
60.0000 mg | ORAL_TABLET | Freq: Two times a day (BID) | ORAL | Status: DC
  Administered 2016-10-04 – 2016-10-06 (×4): 60 mg via ORAL
  Filled 2016-10-04 (×4): qty 3

## 2016-10-04 MED ORDER — DOCUSATE SODIUM 100 MG PO CAPS
200.0000 mg | ORAL_CAPSULE | Freq: Two times a day (BID) | ORAL | Status: DC
  Filled 2016-10-04 (×4): qty 2

## 2016-10-04 MED ORDER — ONDANSETRON HCL 4 MG/2ML IJ SOLN: 4.0000 mg | Freq: Four times a day (QID) | INTRAMUSCULAR | Status: DC | PRN

## 2016-10-04 MED ORDER — ACETAMINOPHEN 325 MG PO TABS
650.0000 mg | ORAL_TABLET | Freq: Four times a day (QID) | ORAL | Status: DC | PRN
Start: 2016-10-04 — End: 2016-10-06

## 2016-10-04 MED ORDER — FERROUS SULFATE 325 (65 FE) MG PO TABS
325.0000 mg | ORAL_TABLET | Freq: Two times a day (BID) | ORAL | Status: DC
  Administered 2016-10-05 – 2016-10-06 (×3): 325 mg via ORAL
  Filled 2016-10-04 (×3): qty 1

## 2016-10-04 MED ORDER — CARBOXYMETHYLCELLULOSE SODIUM 1 % OP SOLN: 1.0000 [drp] | Freq: Four times a day (QID) | OPHTHALMIC | Status: DC

## 2016-10-04 MED ORDER — LATANOPROST 0.005 % OP SOLN
1.0000 [drp] | Freq: Every day | OPHTHALMIC | Status: DC
  Administered 2016-10-04 – 2016-10-05 (×2): 1 [drp] via OPHTHALMIC
  Filled 2016-10-04: qty 2.5

## 2016-10-04 MED ORDER — SIMVASTATIN 10 MG PO TABS
10.0000 mg | ORAL_TABLET | Freq: Every day | ORAL | Status: DC
  Administered 2016-10-04 – 2016-10-05 (×2): 10 mg via ORAL
  Filled 2016-10-04 (×2): qty 1

## 2016-10-04 MED ORDER — ROPINIROLE HCL 0.5 MG PO TABS
1.5000 mg | ORAL_TABLET | Freq: Every day | ORAL | Status: DC
  Administered 2016-10-04 – 2016-10-05 (×2): 1.5 mg via ORAL
  Filled 2016-10-04 (×2): qty 1

## 2016-10-04 MED ORDER — OXYCODONE HCL 5 MG PO TABS
5.0000 mg | ORAL_TABLET | Freq: Four times a day (QID) | ORAL | Status: DC | PRN
  Administered 2016-10-05: 5 mg via ORAL
  Filled 2016-10-04: qty 1

## 2016-10-04 MED ORDER — POLYVINYL ALCOHOL 1.4 % OP SOLN
1.0000 [drp] | Freq: Two times a day (BID) | OPHTHALMIC | Status: DC | PRN
  Filled 2016-10-04: qty 15

## 2016-10-04 MED ORDER — SODIUM CHLORIDE 0.9% FLUSH
3.0000 mL | Freq: Two times a day (BID) | INTRAVENOUS | Status: DC
  Administered 2016-10-04 – 2016-10-06 (×4): 3 mL via INTRAVENOUS

## 2016-10-04 MED ORDER — VENLAFAXINE HCL ER 75 MG PO CP24
75.0000 mg | ORAL_CAPSULE | Freq: Every day | ORAL | Status: DC
  Administered 2016-10-05 – 2016-10-06 (×2): 75 mg via ORAL
  Filled 2016-10-04 (×2): qty 1

## 2016-10-04 MED ORDER — PRO-STAT SUGAR FREE PO LIQD
30.0000 mL | Freq: Four times a day (QID) | ORAL | Status: DC
  Administered 2016-10-04 – 2016-10-06 (×6): 30 mL via ORAL
  Filled 2016-10-04 (×6): qty 30

## 2016-10-04 MED ORDER — DIVALPROEX SODIUM 250 MG PO DR TAB
500.0000 mg | DELAYED_RELEASE_TABLET | Freq: Every day | ORAL | Status: DC
  Administered 2016-10-04 – 2016-10-05 (×2): 500 mg via ORAL
  Filled 2016-10-04 (×2): qty 2

## 2016-10-04 MED ORDER — OXYCODONE-ACETAMINOPHEN 7.5-325 MG PO TABS
1.0000 | ORAL_TABLET | Freq: Two times a day (BID) | ORAL | Status: DC
  Administered 2016-10-04 – 2016-10-06 (×4): 1 via ORAL
  Filled 2016-10-04 (×4): qty 1

## 2016-10-04 MED ORDER — FAMOTIDINE 20 MG PO TABS
20.0000 mg | ORAL_TABLET | Freq: Every day | ORAL | Status: DC
  Administered 2016-10-05 – 2016-10-06 (×2): 20 mg via ORAL
  Filled 2016-10-04 (×2): qty 1

## 2016-10-04 MED ORDER — ZOLPIDEM TARTRATE 5 MG PO TABS
5.0000 mg | ORAL_TABLET | Freq: Every evening | ORAL | Status: DC | PRN
  Administered 2016-10-05 (×2): 5 mg via ORAL
  Filled 2016-10-04 (×2): qty 1

## 2016-10-04 MED ORDER — MIRABEGRON ER 25 MG PO TB24
25.0000 mg | ORAL_TABLET | Freq: Every day | ORAL | Status: DC
  Administered 2016-10-05 – 2016-10-06 (×2): 25 mg via ORAL
  Filled 2016-10-04 (×2): qty 1

## 2016-10-04 MED ORDER — SODIUM CHLORIDE 0.9 % IV SOLN
10.0000 mL/h | Freq: Once | INTRAVENOUS | Status: AC
  Administered 2016-10-04: 10 mL/h via INTRAVENOUS

## 2016-10-04 MED ORDER — HYDROCERIN EX CREA
TOPICAL_CREAM | Freq: Every day | CUTANEOUS | Status: DC
  Administered 2016-10-04: 1 via TOPICAL
  Administered 2016-10-05 – 2016-10-06 (×2): via TOPICAL
  Filled 2016-10-04: qty 113

## 2016-10-04 MED ORDER — ACETAZOLAMIDE ER 500 MG PO CP12
500.0000 mg | ORAL_CAPSULE | Freq: Every day | ORAL | Status: DC
  Administered 2016-10-05 – 2016-10-06 (×2): 500 mg via ORAL
  Filled 2016-10-04 (×2): qty 1

## 2016-10-04 MED ORDER — POLYVINYL ALCOHOL 1.4 % OP SOLN
1.0000 [drp] | Freq: Four times a day (QID) | OPHTHALMIC | Status: DC
  Administered 2016-10-04 – 2016-10-06 (×6): 1 [drp] via OPHTHALMIC
  Filled 2016-10-04: qty 15

## 2016-10-04 MED ORDER — POLYETHYLENE GLYCOL 3350 17 G PO PACK: 17.0000 g | PACK | Freq: Every day | ORAL | Status: DC | PRN

## 2016-10-04 NOTE — H&P (Signed)
History and Physical  [name] BJY:[id number] DOB: [birth year] DOA: 10/04/2016  Referring physician: Zachery Daueroni Allen, ER physician  PCP: At skilled nursing facility Outpatient Specialists: None Patient coming from: Skilled nursing and & is bedbound  Chief Complaint: Weak   HPI: Maurice GravelHerman M Inga is a 66 y.o. male with medical history significant of paraplegia with secondary colostomy and suprapubic catheter who is bedbound with a history of chronic iron deficiency anemia brought in from his nursing facility after feeling weak and found to have low blood.  ED Course: In the emergency room, patient's hemoglobin was noted to be 5.9. Vitals are stable. He noted no signs of active bleeding. ER physician was able to do a Hemoccult which was negative. 2 units packed red blood cells were ordered. Patient was noted to have a large number of decubitus and lower extremity ulcers there was recommended for better wound care. Hospitalists were called for further evaluation and admission  Review of Systems: Patient seen after arrival to floor . Pt complains of feeling very weak. He is hungry. He says that he does not have any teeth and does not have any false teeth but he still able to eat.  Because of his paraplegia he says he is not able to move his legs.  Pt denies any headaches, vision changes, dysphagia, chest pain, palpitations, shortness of breath, wheeze, cough, abdominal pain, hematuria,, constipation,.  Review of systems are otherwise negative   Past Medical History:  Diagnosis Date  . Anemia   . Anxiety   . Blood clotting disorder (HCC)   . Cellulitis   . CHF (congestive heart failure) (HCC)   . Coronary artery disease   . Dysphagia, oral phase   . GERD (gastroesophageal reflux disease)   . Hematuria 11/17/2015  . Hypertension   . Irritable bowel syndrome   . Muscle weakness (generalized)   . Neurogenic dysfunction of the urinary bladder   . Obesity   . Paraplegia (HCC)   .  Pneumonia   . Seizure (HCC)   . Urinary tract infection    Past Surgical History:  Procedure Laterality Date  . COLON SURGERY     colonostomy  . COLONOSCOPY WITH PROPOFOL N/A 05/25/2013   Procedure: COLONOSCOPY WITH PROPOFOL;  Surgeon: Graylin ShiverSalem F Ganem, MD;  Location: [facility];  Service: Endoscopy;  Laterality: N/A;  . colonostomy    . ESOPHAGOGASTRODUODENOSCOPY (EGD) WITH PROPOFOL N/A 05/25/2013   Procedure: ESOPHAGOGASTRODUODENOSCOPY (EGD) WITH PROPOFOL;  Surgeon: Graylin ShiverSalem F Ganem, MD;  Location: [facility];  Service: Endoscopy;  Laterality: N/A;    Social History:  reports that he has never smoked. He has never used smokeless tobacco. He reports that he does not drink alcohol or use drugs.  Patient resides in a skilled nursing facility   Allergies  Allergen Reactions  . Ciprofloxacin Itching  . Minocycline Hcl Itching    Family History  Problem Relation Age of Onset  . Heart failure Mother   . Heart failure Father   . Brain cancer Brother     Had a tumor  . Colon cancer Neg Hx   . Colon polyps Neg Hx   . Kidney disease Neg Hx   . Gallbladder disease Neg Hx       Prior to Admission medications   Medication Sig Start Date End Date Taking? Authorizing Provider  acetaZOLAMIDE (DIAMOX) 500 MG capsule Take 500 mg by mouth daily.   Yes Historical Provider, MD  alum & mag hydroxide-simeth (MAALOX/MYLANTA) 200-200-20 MG/5ML suspension Take  30 mLs by mouth every 4 (four) hours as needed for indigestion or heartburn.   Yes Historical Provider, MD  Amino Acids-Protein Hydrolys (FEEDING SUPPLEMENT, PRO-STAT SUGAR FREE 64,) LIQD Take 30 mLs by mouth 4 (four) times daily.   Yes Historical Provider, MD  carboxymethylcellulose 1 % ophthalmic solution Place 1 drop into both eyes 4 (four) times daily.    Yes Historical Provider, MD  divalproex (DEPAKOTE) 500 MG DR tablet Take 500 mg by mouth at bedtime.    Yes Historical Provider, MD  docusate sodium (COLACE) 100 MG capsule Take 200 mg by  mouth 2 (two) times daily.    Yes Historical Provider, MD  ferrous sulfate 325 (65 FE) MG tablet Take 325 mg by mouth 2 (two) times daily with a meal.   Yes Historical Provider, MD  furosemide (LASIX) 20 MG tablet Take 60 mg by mouth 2 (two) times daily.   Yes Historical Provider, MD  latanoprost (XALATAN) 0.005 % ophthalmic solution Place 1 drop into both eyes at bedtime.   Yes Historical Provider, MD  loratadine (CLARITIN) 10 MG tablet Take 10 mg by mouth daily.    Yes Historical Provider, MD  Menthol, Topical Analgesic, (BIOFREEZE) 10 % LIQD Apply 1 application topically 2 (two) times daily.   Yes Historical Provider, MD  metoprolol succinate (TOPROL-XL) 25 MG 24 hr tablet Take 12.5 mg by mouth daily.    Yes Historical Provider, MD  mirabegron ER (MYRBETRIQ) 25 MG TB24 tablet Take 25 mg by mouth daily.   Yes Historical Provider, MD  Multiple Vitamin (MULTIVITAMIN WITH MINERALS) TABS tablet Take 1 tablet by mouth daily.   Yes Historical Provider, MD  oxyCODONE (OXY IR/ROXICODONE) 5 MG immediate release tablet Take one tablet by mouth every 6 hours as needed for breakthrough pain Patient taking differently: Take 5 mg by mouth every 6 (six) hours as needed for breakthrough pain.  02/17/15  Yes Tiffany L Reed, DO  oxyCODONE-acetaminophen (PERCOCET) 7.5-325 MG tablet Take 1 tablet by mouth 2 (two) times daily.   Yes Historical Provider, MD  polyethylene glycol (MIRALAX / GLYCOLAX) packet Take 17 g by mouth daily as needed for mild constipation or moderate constipation.    Yes Historical Provider, MD  ranitidine (ZANTAC) 150 MG tablet Take 150 mg by mouth daily.    Yes Historical Provider, MD  rOPINIRole (REQUIP) 1 MG tablet Take 1.5 mg by mouth at bedtime.   Yes Historical Provider, MD  saccharomyces boulardii (FLORASTOR) 250 MG capsule Take 250 mg by mouth 2 (two) times daily.   Yes Historical Provider, MD  sennosides-docusate sodium (SENOKOT-S) 8.6-50 MG tablet Take 1 tablet by mouth 2 (two) times  daily.    Yes Historical Provider, MD  simvastatin (ZOCOR) 10 MG tablet Take 10 mg by mouth at bedtime.   Yes Historical Provider, MD  sodium chloride (OCEAN) 0.65 % SOLN nasal spray Place 1 spray into both nostrils every 3 (three) hours as needed for congestion.   Yes Historical Provider, MD  Trolamine Salicylate (ASPERCREME EX) Apply 1 application topically daily. Pt applies to right shoulder.   Yes Historical Provider, MD  venlafaxine XR (EFFEXOR XR) 75 MG 24 hr capsule Take 75 mg by mouth daily with breakfast.   Yes Historical Provider, MD  vitamin C (ASCORBIC ACID) 500 MG tablet Take 500 mg by mouth 2 (two) times daily.    Yes Historical Provider, MD  Vitamin D, Ergocalciferol, (DRISDOL) 50000 units CAPS capsule Take 50,000 Units by mouth every 30 (thirty) days.  Pt takes on the 15th of every month.   Yes Historical Provider, MD  zinc sulfate 220 (50 Zn) MG capsule Take 220 mg by mouth daily.   Yes Historical Provider, MD  zolpidem (AMBIEN) 10 MG tablet Take 10 mg by mouth at bedtime as needed for sleep.   Yes Historical Provider, MD    Physical Exam: BP 127/63 (BP Location: Right Arm)   Pulse 84   Temp 98 F (36.7 C) (Oral)   Resp (!) 22   Ht 6\' 1"  (1.854 m)   Wt 96.1 kg (211 lb 12.8 oz)   SpO2 99%   BMI 27.94 kg/m   General:  Alert and oriented 3 although he does appear somewhat simple  Eyes: Sclera nonicteric, extraocular movements are intact HEENT: Normocephalic, atraumatic, mucous membranes are dry  Neck: Supple, no JVD Cardiovascular: Regular rate and rhythm, S1-S2  Respiratory: clear to auscultation bilaterally  Abdomen: soft, obese, nontender, positive bowel sounds, colostomy noted  Skin: patient has maculopapular rash across his trunk he also has multiple stage III and 42 his ulcers on his back side and legs, especially his heels and feet Musculoskeletal: no clubbing cyanosis, 1+ pitting edema from knees down,  psychiatric: Appropriate, no evidence of  psychoses Neurologic:  with paraplegia, unable to move his lower extremities, otherwise no focal deficits          Labs on Admission:  Basic Metabolic Panel:  Recent Labs Lab 10/04/16 1310  NA 137  K 3.4*  CL 97*  CO2 33*  GLUCOSE 114*  BUN 53*  CREATININE 0.84  CALCIUM 8.8*   Liver Function Tests: No results for input(s): AST, ALT, ALKPHOS, BILITOT, PROT, ALBUMIN in the last 168 hours. No results for input(s): LIPASE, AMYLASE in the last 168 hours. No results for input(s): AMMONIA in the last 168 hours. CBC:  Recent Labs Lab 10/04/16 1310  WBC 10.2  HGB 5.9*  HCT 22.7*  MCV 70.7*  PLT 599*   Cardiac Enzymes: No results for input(s): CKTOTAL, CKMB, CKMBINDEX, TROPONINI in the last 168 hours.  BNP (last 3 results) No results for input(s): BNP in the last 8760 hours.  ProBNP (last 3 results) No results for input(s): PROBNP in the last 8760 hours.  CBG:  Recent Labs Lab 10/04/16 1247  GLUCAP 122*    Radiological Exams on Admission: No results found.  EKG: Independently reviewed. Sinus rhythm   Assessment/Plan Present on Admission: . Lower paraplegia [facility]) status post chronic suprapubic catheter and ostomy . Decubitus ulcer of coccygeal region, lower extremity bilaterally: We'll have wound care examined . Iron deficiency anemia: No evidence of GI bleed. Vitals are stable. In review previous labs, it looks like he had been trending downward for a while. Have ordered iron studies. Will get 2 units and repeat hemoglobin after. . Hypertension: Continue diuretic, but held beta blocker, we'll watch. . Overweight: Patient meets criteria with BMI greater than 25 Seizure disorder: Continue Depakote.  Principal Problem:   Iron deficiency anemia Active Problems:   Lower paraplegia (HCC)   Seizure disorder (HCC)   Decubitus ulcer of coccygeal region   Hypertension    DVT prophylaxis: SCDs in the unlikely possibility that this is GI bleed   Code Status:  Full code, although patient appeared may not have full mentation ability to tell me   Family Communication: Left message for sister Disposition Plan: Likely will be here for several days as we improve blood status plus, with wound care plan   Consults called:  None   Admission status: Given expectation for patient to be here for several days requiring Hospital services, had placed as inpatient     Hollice Espy MD [facility] Pager [phone]  If 7PM-7AM, please contact night-coverage [email or web address] Password [facility]  10/04/2016, 7:13 PM

## 2016-10-04 NOTE — Consult Note (Signed)
WOC Nurse ostomy consult note Stoma type/location: LLQ colostomy Stomal assessment/size: Viewed through pouching system applied today by patient using his own supplies.  Approximately 1 and 1/4 inch round, red, moist and slightly raised. OS at center. Peristomal assessment: Not seen today Treatment options for stomal/peristomal skin: None indicated Output: soft light brown stool Ostomy pouching: 1pc Active Life pouch by [employer].  We use the 1-piece pouch by [employer], [place] [phone].  Patient is independent in ostomy care and prefers the [employer] brand.  Education provided: None Enrolled patient in [employer] DC program: No  WOC Nurse wound consult note Reason for Consult: Patient seen by my partner D. Engles and [facility] surgeon F. Donell BeersByerly previously in December of 2013.  No indicateion of hospitalization since that time.  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  Previously a Stage 4, now healing, but still open, draining, bleeding and difficult to offload. Paraplegia, bedridden, iron deficiency.  Wound type: pressure, shear Pressure Injury POA: Yes (right heel, left LE (lateral), left lateral thigh and buttocks, sacrum) Measurement: Sacrum/buttocks 20cm x 30cm with areas of depth ranging from 0.2cm to 0.5cm, wound bed is Beefy red with islands of scar tissue interspersed. Serosanguinous exudate and some areas with frank bleeding. Right heel:  3cm x 2cm x 0.1cm with partial thickness tissue loss, wound bed is pink, moist with scant serous exudate. Left lateral LE:  18cm x 4cm x 0.2cm area of full thickness tissue loss.  Bilateral LEs with pale, dry skin with plaques. Left lateral thigh:  3cm x 2cm x 0.1cm full thickness wound with beefy red wound bed, scant serous exudate Wound bed: As described above Drainage (amount, consistency, odor) As described above Periwound: Dry Dressing  procedure/placement/frequency: Patient will be provided with a mattress replacement with low air loss feature, bilateral pressure redistribution boots for the feet, a trapeze bar as that is what he has at the skilled nursing facility and he reports that that DME makes it possible for him to reposition himself. He states that he has boots at the facility but that they are "beat".  He is asking what he can do to help his wounds heal and I share that his situation is extremely difficult and that the wounds have had a long duration. He states that he has not given up hope for healing. Topical wound care orders consisting of calcium alginate dressing to the buttocks and sacrum (for their absorptive and hemostatic properties) will be implemented.  We will moisturize the LEs with Eucerin cream and dress the LLE and right heel wounds with xeroform prior to wrapping and placing in pressure redistribution boots. Nursing staff to turn and reposition off sacrum as able, minimizing tim spent in the supine position. Suggest surgical consult, if you agree, please order. WOC nursing team will not follow routinely, but will remain available to this patient, the nursing and medical teams.  Please re-consult if needed. Thanks, Maurice MowLaurie Everline Mahaffy, MSN, RN, GNP, Hans EdenCWOCN, CWON-AP, FAAN  Pager# [phone]

## 2016-10-04 NOTE — ED Notes (Signed)
ED Provider at bedside. 

## 2016-10-04 NOTE — ED Notes (Signed)
Bed: WA03 Expected date:  Expected time:  Means of arrival:  Comments: 66 yo m low hgb

## 2016-10-04 NOTE — ED Provider Notes (Signed)
[facility]-EMERGENCY DEPT Provider Note   CSN: [id number] Arrival date & time: 10/04/16  1219     History   Chief Complaint Chief Complaint  Patient presents with  . ABNORMAL LABS  . Weakness  . HGB 5.7    HPI MAKENZIE VITTORIO is a 66 y.o. male.  66 year old male presents from nursing home with low hemoglobin from blood work that was done 3 days ago. Patient is bedridden but states that for the past several weeks he has had increasing weakness as well as shortness of breath. Denies any cough or congestion. Denies any prior history of gastrointestinal bleeding. Denies use of any blood thinners at this time. No reported history of recurrent upper GI bleeding. Denies abdominal discomfort. Was sent here for further evaluation.      Past Medical History:  Diagnosis Date  . Anemia   . Anxiety   . Blood clotting disorder (HCC)   . Cellulitis   . CHF (congestive heart failure) (HCC)   . Coronary artery disease   . Dysphagia, oral phase   . GERD (gastroesophageal reflux disease)   . Hematuria 11/17/2015  . Hypertension   . Irritable bowel syndrome   . Muscle weakness (generalized)   . Neurogenic dysfunction of the urinary bladder   . Obesity   . Paraplegia (HCC)   . Pneumonia   . Seizure (HCC)   . Urinary tract infection     Patient Active Problem List   Diagnosis Date Noted  . Decubitus ulcer of foot 01/12/2016  . Hematuria 11/17/2015  . Pain in shoulder 10/27/2015  . Colostomy in place [facility]) 07/13/2012  . Acute respiratory failure (HCC) 07/12/2012  . H/O CHF 07/12/2012  . Lower paraplegia (HCC) 07/12/2012  . Seizure disorder (HCC) 07/12/2012  . Decubitus ulcer of coccygeal region 07/12/2012  . Iron deficiency anemia 07/12/2012  . 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 COLON SURGERY     colonostomy  . COLONOSCOPY WITH PROPOFOL N/A 05/25/2013   Procedure: COLONOSCOPY WITH PROPOFOL;  Surgeon: Graylin Shiver, MD;   Location: [facility];  Service: Endoscopy;  Laterality: N/A;  . colonostomy    . ESOPHAGOGASTRODUODENOSCOPY (EGD) WITH PROPOFOL N/A 05/25/2013   Procedure: ESOPHAGOGASTRODUODENOSCOPY (EGD) WITH PROPOFOL;  Surgeon: Graylin Shiver, MD;  Location: [facility];  Service: Endoscopy;  Laterality: N/A;       Home Medications    Prior to Admission medications   Medication Sig Start Date End Date Taking? Authorizing Provider  acetaZOLAMIDE (DIAMOX) 250 MG tablet Take 500 mg by mouth daily.    Historical Provider, MD  ARIPiprazole (ABILIFY PO) Take 7.5 mg by mouth at bedtime. For depressive psychosis    Historical Provider, MD  carboxymethylcellulose 1 % ophthalmic solution Place 1 drop into both eyes 2 (two) times daily. For dry eye.    Historical Provider, MD  divalproex (DEPAKOTE) 500 MG DR tablet Take 500 mg by mouth 2 (two) times daily.    Historical Provider, MD  docusate sodium (COLACE) 100 MG capsule Take 200 mg by mouth 2 (two) times daily.     Historical Provider, MD  ferrous sulfate 325 (65 FE) MG tablet Take 325 mg by mouth 2 (two) times daily with a meal.    Historical Provider, MD  furosemide (LASIX) 20 MG tablet Take 60 mg by mouth 2 (two) times daily.    Historical Provider, MD  Ipratropium-Albuterol (DUONEB IN) Inhale into the lungs. One vial via nebulizer every  6 hours as needed for wheezing    Historical Provider, MD  latanoprost (XALATAN) 0.005 % ophthalmic solution Place 1 drop into both eyes at bedtime.    Historical Provider, MD  lidocaine (LIDODERM) 5 % Place 1 patch onto the skin daily. Remove & Discard patch within 12 hours or as directed by MD    Historical Provider, MD  loratadine (CLARITIN) 10 MG tablet Take 10 mg by mouth daily.     Historical Provider, MD  Menthol, Topical Analgesic, (BIOFREEZE EX) Apply 5 % topically. Apply topically to both shoulders twice daily    Historical Provider, MD  metoprolol succinate (TOPROL-XL) 25 MG 24 hr tablet Take 12.5 mg by mouth  every morning.    Historical Provider, MD  Multiple Vitamins-Minerals (CERTA-VITE PO) Take 1 tablet by mouth daily.    Historical Provider, MD  NASAL SPRAY SALINE NA Place into the nose daily. Pt uses 2 sprays in each nostril every day    Historical Provider, MD  oxybutynin (DITROPAN-XL) 10 MG 24 hr tablet Take 10 mg by mouth. Take 2 tablets daily for bladder    Historical Provider, MD  oxyCODONE (OXY IR/ROXICODONE) 5 MG immediate release tablet Take one tablet by mouth every 6 hours as needed for breakthrough pain 02/17/15   Tiffany L Reed, DO  oxyCODONE-acetaminophen (PERCOCET) 7.5-325 MG tablet Take one tablet by mouth twice daily for pain DO NOT EXCEED 4 GM OF TYLENOL IN 24 HOURS 01/14/16   Kirt BoysMonica Carter, DO  pantoprazole (PROTONIX) 40 MG tablet Take 1 tablet (40 mg total) by mouth daily. 08/13/14   Beverley FiedlerJay M Pyrtle, MD  Pollen Extracts (PROSTAT PO) Take by mouth. 60 cc twice daily    Historical Provider, MD  Polyethyl Glycol-Propyl Glycol (SYSTANE OP) Place 1 drop into both eyes daily.    Historical Provider, MD  polyethylene glycol (MIRALAX / GLYCOLAX) packet Take 17 g by mouth daily.    Historical Provider, MD  potassium chloride (K-DUR,KLOR-CON) 10 MEQ tablet Take 10 mEq by mouth daily.    Historical Provider, MD  ranitidine (ZANTAC) 150 MG tablet Take 150 mg by mouth daily.  07/24/14   Historical Provider, MD  rOPINIRole (REQUIP) 1 MG tablet Take 1.5 mg by mouth at bedtime.    Historical Provider, MD  saccharomyces boulardii (FLORASTOR) 250 MG capsule Take 250 mg by mouth 2 (two) times daily.    Historical Provider, MD  sennosides-docusate sodium (SENOKOT-S) 8.6-50 MG tablet Take 1 tablet by mouth 2 (two) times daily.     Historical Provider, MD  Trolamine Salicylate (ASPERCREME EX) Apply topically. Apply to right shoulder every afternoon    Historical Provider, MD  venlafaxine (EFFEXOR) 75 MG tablet Take 75 mg by mouth daily.    Historical Provider, MD  vitamin C (ASCORBIC ACID) 500 MG tablet Take  500 mg by mouth 2 (two) times daily.     Historical Provider, MD  Vitamin D, Ergocalciferol, (DRISDOL) 50000 UNITS CAPS capsule Take 50,000 Units by mouth every 30 (thirty) days.    Historical Provider, MD  zolpidem (AMBIEN) 10 MG tablet Take 1 tablet (10 mg total) by mouth at bedtime as needed for sleep. 09/17/15 10/17/15  Kimber RelicArthur G Green, MD  zolpidem (AMBIEN) 10 MG tablet Take 10 mg by mouth at bedtime as needed for sleep.    Historical Provider, MD    Family History Family History  Problem Relation Age of Onset  . Heart failure Mother   . Heart failure Father   . Brain cancer  Brother     Had a tumor  . Colon cancer Neg Hx   . Colon polyps Neg Hx   . Kidney disease Neg Hx   . Gallbladder disease Neg Hx     Social History Social History  Substance Use Topics  . Smoking status: Never Smoker  . Smokeless tobacco: Never Used  . Alcohol use No     Allergies   Ciprofloxacin and Minocycline hcl   Review of Systems Review of Systems  All other systems reviewed and are negative.    Physical Exam Updated Vital Signs BP 119/56   Pulse 88   Temp 98.4 F (36.9 C) (Oral)   Resp 16   SpO2 98%   Physical Exam  Constitutional: He is oriented to person, place, and time. He appears well-developed and well-nourished.  Non-toxic appearance. No distress.  HENT:  Head: Normocephalic and atraumatic.  Eyes: Conjunctivae, EOM and lids are normal. Pupils are equal, round, and reactive to light.  Neck: Normal range of motion. Neck supple. No tracheal deviation present. No thyroid mass present.  Cardiovascular: Normal rate, regular rhythm and normal heart sounds.  Exam reveals no gallop.   No murmur heard. Pulmonary/Chest: Effort normal and breath sounds normal. No stridor. No respiratory distress. He has no decreased breath sounds. He has no wheezes. He has no rhonchi. He has no rales.  Abdominal: Soft. Normal appearance and bowel sounds are normal. He exhibits no distension. There is no  tenderness. There is no rebound and no CVA tenderness.  Genitourinary:     Musculoskeletal: Normal range of motion. He exhibits no edema or tenderness.  Neurological: He is alert and oriented to person, place, and time. He has normal strength. No cranial nerve deficit or sensory deficit. GCS eye subscore is 4. GCS verbal subscore is 5. GCS motor subscore is 6.  Skin: Skin is warm and dry. No abrasion and no rash noted. There is pallor.  Psychiatric: He has a normal mood and affect. His speech is normal and behavior is normal.  Nursing note and vitals reviewed.    ED Treatments / Results  Labs (all labs ordered are listed, but only abnormal results are displayed) Labs Reviewed  CBG MONITORING, ED - Abnormal; Notable for the following:       Result Value   Glucose-Capillary 122 (*)    All other components within normal limits  BASIC METABOLIC PANEL  CBC  URINALYSIS, ROUTINE W REFLEX MICROSCOPIC  POC OCCULT BLOOD, ED  TYPE AND SCREEN    EKG  EKG Interpretation  Date/Time:  Monday October 04 2016 12:32:27 EST Ventricular Rate:  88 PR Interval:    QRS Duration: 95 QT Interval:  362 QTC Calculation: 438 R Axis:   61 Text Interpretation:  Sinus or ectopic atrial rhythm RSR' in V1 or V2, right VCD or RVH Confirmed by Maryse Brierley  MD, Jaylene Arrowood ([id number]) on 10/04/2016 12:43:38 PM       Radiology No results found.  Procedures Procedures (including critical care time)  Medications Ordered in ED Medications - No data to display   Initial Impression / Assessment and Plan / ED Course  I have reviewed the triage vital signs and the nursing notes.  Pertinent labs & imaging results that were available during my care of the patient were reviewed by me and considered in my medical decision making (see chart for details).     Patient hemoglobin of 5.9. Have ordered 2 units of packed red blood cells. Patient has had weakness  has been gradual for the past 2 weeks. No gross blood noted on  digital exam. Will consult medicine for admission.  Final Clinical Impressions(s) / ED Diagnoses   Final diagnoses:  None    New Prescriptions New Prescriptions   No medications on file     Lorre Nick, MD 10/04/16 1421

## 2016-10-04 NOTE — ED Notes (Signed)
BLOOD CONSENT SIGNED. 

## 2016-10-04 NOTE — ED Triage Notes (Signed)
Per [facility] Pt resides at [facility]. FULL CODE. Generalized weakness for several weeks. HGB 5.7 confirmed by labs today. Last transfusion approx 8 months ago. Denies N/V/D or fever. Denies blood loss

## 2016-10-05 DIAGNOSIS — D509 Iron deficiency anemia, unspecified: Principal | ICD-10-CM

## 2016-10-05 LAB — PREPARE RBC (CROSSMATCH)

## 2016-10-05 LAB — CBC
HCT: 27.5 % — ABNORMAL LOW (ref 39.0–52.0)
HCT: 29.7 % — ABNORMAL LOW (ref 39.0–52.0)
Hemoglobin: 7.5 g/dL — ABNORMAL LOW (ref 13.0–17.0)
Hemoglobin: 8.4 g/dL — ABNORMAL LOW (ref 13.0–17.0)
MCH: 19.9 pg — AB (ref 26.0–34.0)
MCH: 20.8 pg — AB (ref 26.0–34.0)
MCHC: 27.3 g/dL — AB (ref 30.0–36.0)
MCHC: 28.3 g/dL — AB (ref 30.0–36.0)
MCV: 72.9 fL — ABNORMAL LOW (ref 78.0–100.0)
MCV: 73.7 fL — ABNORMAL LOW (ref 78.0–100.0)
Platelets: 542 10*3/uL — ABNORMAL HIGH (ref 150–400)
Platelets: 545 10*3/uL — ABNORMAL HIGH (ref 150–400)
RBC: 3.77 MIL/uL — ABNORMAL LOW (ref 4.22–5.81)
RBC: 4.03 MIL/uL — ABNORMAL LOW (ref 4.22–5.81)
RDW: 20.7 % — AB (ref 11.5–15.5)
RDW: 20.9 % — AB (ref 11.5–15.5)
WBC: 8.9 10*3/uL (ref 4.0–10.5)
WBC: 9.6 10*3/uL (ref 4.0–10.5)

## 2016-10-05 LAB — IRON AND TIBC
IRON: 13 ug/dL — AB (ref 45–182)
SATURATION RATIOS: 5 % — AB (ref 17.9–39.5)
TIBC: 262 ug/dL (ref 250–450)
UIBC: 249 ug/dL

## 2016-10-05 LAB — FERRITIN: FERRITIN: 22 ng/mL — AB (ref 24–336)

## 2016-10-05 MED ORDER — FUROSEMIDE 10 MG/ML IJ SOLN
20.0000 mg | Freq: Once | INTRAMUSCULAR | Status: AC
  Administered 2016-10-05: 20 mg via INTRAVENOUS
  Filled 2016-10-05: qty 2

## 2016-10-05 MED ORDER — DIPHENHYDRAMINE HCL 25 MG PO CAPS
25.0000 mg | ORAL_CAPSULE | Freq: Once | ORAL | Status: AC
  Administered 2016-10-05: 25 mg via ORAL
  Filled 2016-10-05: qty 1

## 2016-10-05 MED ORDER — SODIUM CHLORIDE 0.9 % IV SOLN
Freq: Once | INTRAVENOUS | Status: AC
  Administered 2016-10-05: 16:00:00 via INTRAVENOUS

## 2016-10-05 NOTE — Progress Notes (Signed)
PROGRESS NOTE    [name]  ZOX:[id number] DOB: 1950/11/08 DOA: 10/04/2016 PCP: No primary care provider on file.  Outpatient Specialists:     Brief Narrative:   66 y/o ? 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  Tx 2 U PRBC-->7.5.  Give one mor eunit overnight ans reassess.  Last endo 05/2013 was esophagus nl.  Rectum sewn shut at that time.  Await rpt Hemoglobin and if stable ok to return to SNF  PAralegia T7-T8-paraplic with Colostomy and Suprapubic-clinically stable.  No new issue.  Monitor  Multiple decubiti --wound visualized by myself when nursing changing dressings after am rounds Sacrum/buttocks 20cm x 30cm  Right heel:  3cm x 2cm x 0.1cm Left lateral LE:  18cm x 4cm x 0.2cm area of full thickness tissue loss.  Bilateral LEs with pale, dry skin with plaques.Left lateral thigh:  3cm x 2cm x 0.1cm full thickness wound with beefy red wound bed, scant serous exudate-On-going problem.  WOC nurse input appreciated--He will need OP evaluation of these wounds and was [facility].  Given there is no active infection and long-standing nature-this can be arranged as OP.  Dress with calcium alginate ans reassess as OP  Fe Def-giving Blood.  Will Rx Po iron on d/c   Full code Inpatient Not yet ready for d/c but might be able to d/c if hemoglobin stable D/c tele     Subjective: Well Slight HOH needing supplemental O2 and desat 80  without  Objective: Vitals:   10/04/16 2305 10/04/16 2332 10/05/16 0142 10/05/16 0553  BP: (!) 162/65 120/64 118/63 112/60  Pulse: 87 87 83 89  Resp: 15 15 15 15   Temp: 98.8 F (37.1 C) 97.8 F (36.6 C) 98.9 F (37.2 C) 98.4 F (36.9 C)  TempSrc: Oral Oral Oral Oral  SpO2: 98% 100% 100% 100%  Weight:      Height:        Intake/Output Summary (Last 24 hours) at 10/05/16 1336 Last data filed at 10/05/16 0554  Gross per 24 hour  Intake             1482 ml  Output             1600 ml  Net             -118 ml   Filed Weights   10/04/16 1514 10/04/16 1821  Weight: 101.2 kg (223 lb) 96.1 kg (211 lb 12.8 oz)    Examination:  General exam: Appears calm and comfortable  Respiratory system: Clear to auscultation. Respiratory effort normal. Cardiovascular system: S1 & S2 heard, RRR. No JVD, murmurs, rubs, gallops or clicks. No pedal edema. Gastrointestinal system: Abdomen is nondistended, soft and nontender.Significant decubiti noted and sacral decubitus is worst of the multiple areas with almost unstageable wound although no purulence  Central nervous system: Alert and oriented. Unable to move lower extremities Extremities: Symmetric 5 x 5 power. Skin: No  rashes, lesions or ulcers Psychiatry: Judgement and insight appear normal. Mood & affect appropriate.     Data Reviewed: I have personally reviewed following labs and imaging studies  CBC:  Recent Labs Lab 10/04/16 1310 10/05/16 1019  WBC 10.2 8.9  HGB 5.9* 7.5*  HCT 22.7* 27.5*  MCV 70.7* 72.9*  PLT 599* 542*   Basic Metabolic Panel:  Recent Labs Lab 10/04/16 1310  NA 137  K 3.4*  CL 97*  CO2 33*  GLUCOSE 114*  BUN 53*  CREATININE 0.84  CALCIUM 8.8*   GFR: Estimated Creatinine Clearance: 105.7 mL/min (by C-G formula based on SCr of 0.84 mg/dL). Liver Function Tests: No results for input(s): AST, ALT, ALKPHOS, BILITOT, PROT, ALBUMIN in the last 168 hours. No results for input(s): LIPASE, AMYLASE in  the last 168 hours. No results for input(s): AMMONIA in the last 168 hours. Coagulation Profile: No results for input(s): INR, PROTIME in the last 168 hours. Cardiac Enzymes: No results for input(s): CKTOTAL, CKMB, CKMBINDEX, TROPONINI in the last 168 hours. BNP (last 3 results) No results for input(s): PROBNP in the last 8760 hours. HbA1C: No results for input(s): HGBA1C in the last 72 hours. CBG:  Recent Labs Lab 10/04/16 1247  GLUCAP 122*   Lipid Profile: No results for input(s): CHOL, HDL, LDLCALC, TRIG, CHOLHDL, LDLDIRECT in the last 72 hours. Thyroid Function Tests: No results for input(s): TSH, T4TOTAL, FREET4, T3FREE, THYROIDAB in the last 72 hours. Anemia Panel:  Recent Labs  10/04/16 2009  FERRITIN 22*  TIBC 262  IRON 13*   Urine analysis:    Component Value Date/Time   COLORURINE YELLOW 10/04/2016 1307   APPEARANCEUR TURBID (A) 10/04/2016 1307   LABSPEC 1.006 10/04/2016 1307   PHURINE 8.0 10/04/2016 1307   GLUCOSEU NEGATIVE 10/04/2016 1307   HGBUR MODERATE (A) 10/04/2016 1307   BILIRUBINUR NEGATIVE 10/04/2016 1307   KETONESUR NEGATIVE 10/04/2016 1307   PROTEINUR 30 (A) 10/04/2016 1307   UROBILINOGEN 0.2 07/12/2012 1410   NITRITE POSITIVE (A) 10/04/2016 1307   LEUKOCYTESUR LARGE (A) 10/04/2016 1307   Sepsis Labs: @LABRCNTIP (procalcitonin:4,lacticidven:4)  )No results found for this or any previous visit (from the past 240 hour(s)).       Radiology Studies: No results found.      Scheduled Meds: . sodium chloride   Intravenous Once  . acetaZOLAMIDE  500 mg Oral Daily  . diphenhydrAMINE  25 mg Oral Once  . divalproex  500 mg Oral QHS  . docusate sodium  200 mg Oral BID  . famotidine  20 mg Oral Daily  . feeding supplement (PRO-STAT SUGAR FREE 64)  30 mL Oral QID  . ferrous sulfate  325 mg Oral BID WC  . furosemide  20 mg Intravenous Once  . furosemide  60 mg Oral BID  . hydrocerin   Topical Daily  . latanoprost  1 drop Both Eyes QHS    . mirabegron ER  25 mg Oral Daily  . oxyCODONE-acetaminophen  1 tablet Oral BID  . polyvinyl alcohol  1 drop Both Eyes QID  . rOPINIRole  1.5 mg Oral QHS  . saccharomyces boulardii  250 mg Oral BID  . simvastatin  10 mg Oral QHS  . sodium chloride flush  3 mL Intravenous Q12H  . venlafaxine XR  75 mg Oral Q breakfast   Continuous Infusions:   LOS: 1 day    Time spent: 62    Pleas Koch, MD [facility] ([facility]   If 7PM-7AM, please contact  night-coverage [email or web address] Password [facility] 10/05/2016, 1:36 PM

## 2016-10-05 NOTE — Care Management Note (Signed)
Case Management Note  Patient Details  Name: Maurice GravelHerman M Badertscher MRN: [id number] Date of Birth: 1950/10/30  Subjective/Objective:   Pt admitted with dark stools, bilateral wounds                  Action/Plan[facility]   Expected Discharge Date:  10/06/16               Expected Discharge Plan:  Skilled Nursing Facility  In-House Referral:  Clinical Social Work  Discharge planning Services  CM Consult  Post Acute Care Choice:  NA Choice offered to:  NA  DME Arranged:  N/A DME Agency:  NA  HH Arranged:  NA HH Agency:  NA  Status of Service:  In process, will continue to follow  If discussed at Long Length of Stay Meetings, dates discussed:    Additional CommentsGeni Bers:  Sheriece Jefcoat, RN 10/05/2016, 3:11 PM

## 2016-10-05 NOTE — NC FL2 (Signed)
[place] MEDICAID FL2 LEVEL OF CARE SCREENING TOOL     IDENTIFICATION  Patient Name: [name] Birthdate: 1951-05-03 Sex: male Admission Date (Current Location): 10/04/2016  [facility] and [place] Number:  Producer, television/film/video and Address:  [facility],  [address], [place] [address]      Provider Number: [id number]  Attending Physician Name and Address:  Rhetta Mura, MD  Relative Name and Phone Number:       Current Level of Care: Hospital Recommended Level of Care: Skilled Nursing Facility Prior Approval Number:    Date Approved/Denied:   PASRR Number: [id number]  Discharge Plan: SNF    Current Diagnoses: Patient Active Problem List   Diagnosis Date Noted  . Anemia 10/04/2016  . Hypertension 10/04/2016  . Overweight (BMI 25.0-29.9) 10/04/2016  . Decubitus ulcer of foot 01/12/2016  . Hematuria 11/17/2015  . Pain in shoulder 10/27/2015  . Colostomy in place [facility]) 07/13/2012  . Acute respiratory failure (HCC) 07/12/2012  . H/O CHF 07/12/2012  . Lower paraplegia (HCC) 07/12/2012  . Seizure disorder (HCC) 07/12/2012  . Decubitus ulcer of coccygeal region 07/12/2012  . Iron deficiency anemia 07/12/2012  . 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    Orientation RESPIRATION BLADDER Height & Weight     Self, Time, Situation, Place  O2 (2L/min) External catheter Weight: 211 lb 12.8 oz (96.1 kg) Height:  6\' 1"  (185.4 cm)  BEHAVIORAL SYMPTOMS/MOOD NEUROLOGICAL BOWEL NUTRITION STATUS        Diet (DYS 3)  AMBULATORY STATUS COMMUNICATION OF NEEDS Skin   Extensive Assist Verbally Other (Comment) ((Open or Dehisced)  Buttocks Posterior;Circumferential satge 4   (Open or Dehisced)  Leg Right WOUND    (Open or Dehisced) Ankle Right WOUND    (Open or Dehisced) Ankle Left;Anterior stage3)                       Personal Care Assistance Level of Assistance  Bathing, Feeding, Dressing Bathing Assistance: Maximum assistance Feeding assistance:  Limited assistance Dressing Assistance: Maximum assistance     Functional Limitations Info  Sight, Hearing, Speech Sight Info: Adequate Hearing Info: Adequate Speech Info: Adequate    SPECIAL CARE FACTORS FREQUENCY                       Contractures Contractures Info: Not present    Additional Factors Info  Code Status, Allergies Code Status Info: Full Allergies Info: Allergies:  Ciprofloxacin, Minocycline Hcl           Current Medications (10/05/2016):  This is the current hospital active medication list Current Facility-Administered Medications  Medication Dose Route Frequency Provider Last Rate Last Dose  . 0.9 %  sodium chloride infusion   Intravenous Once Rhetta Mura, MD      . acetaminophen (TYLENOL) tablet 650 mg  650 mg Oral Q6H PRN Hollice Espy, MD       Or  . acetaminophen (TYLENOL) suppository 650 mg  650 mg Rectal Q6H PRN Hollice Espy, MD      . acetaZOLAMIDE (DIAMOX) 12 hr capsule 500 mg  500 mg Oral Daily Hollice Espy, MD   500 mg at 10/05/16 1115  . diphenhydrAMINE (BENADRYL) capsule 25 mg  25 mg Oral Once Rhetta Mura, MD      . divalproex (DEPAKOTE) DR tablet 500 mg  500 mg Oral QHS Hollice Espy, MD   500 mg at 10/04/16 2059  . docusate sodium (COLACE) capsule  200 mg  200 mg Oral BID Hollice EspySendil K Krishnan, MD      . famotidine (PEPCID) tablet 20 mg  20 mg Oral Daily Hollice EspySendil K Krishnan, MD   20 mg at 10/05/16 1102  . feeding supplement (PRO-STAT SUGAR FREE 64) liquid 30 mL  30 mL Oral QID Hollice EspySendil K Krishnan, MD   30 mL at 10/05/16 1100  . ferrous sulfate tablet 325 mg  325 mg Oral BID WC Hollice EspySendil K Krishnan, MD   325 mg at 10/05/16 0842  . furosemide (LASIX) injection 20 mg  20 mg Intravenous Once Rhetta MuraJai-Gurmukh Samtani, MD      . furosemide (LASIX) tablet 60 mg  60 mg Oral BID Hollice EspySendil K Krishnan, MD   60 mg at 10/05/16 [id number]  . hydrocerin (EUCERIN) cream   Topical Daily Hollice EspySendil K Krishnan, MD      . latanoprost (XALATAN) 0.005 %  ophthalmic solution 1 drop  1 drop Both Eyes QHS Hollice EspySendil K Krishnan, MD   1 drop at 10/04/16 2101  . mirabegron ER (MYRBETRIQ) tablet 25 mg  25 mg Oral Daily Hollice EspySendil K Krishnan, MD   25 mg at 10/05/16 1102  . ondansetron (ZOFRAN) tablet 4 mg  4 mg Oral Q6H PRN Hollice EspySendil K Krishnan, MD       Or  . ondansetron [facility](ZOFRAN) injection 4 mg  4 mg Intravenous Q6H PRN Hollice EspySendil K Krishnan, MD      . oxyCODONE (Oxy IR/ROXICODONE) immediate release tablet 5 mg  5 mg Oral Q6H PRN Hollice EspySendil K Krishnan, MD   5 mg at 10/05/16 0243  . oxyCODONE-acetaminophen (PERCOCET) 7.5-325 MG per tablet 1 tablet  1 tablet Oral BID Hollice EspySendil K Krishnan, MD   1 tablet at 10/05/16 1103  . polyethylene glycol (MIRALAX / GLYCOLAX) packet 17 g  17 g Oral Daily PRN Hollice EspySendil K Krishnan, MD      . polyvinyl alcohol (LIQUIFILM TEARS) 1.4 % ophthalmic solution 1 drop  1 drop Both Eyes QID Hollice EspySendil K Krishnan, MD   1 drop at 10/05/16 1103  . rOPINIRole (REQUIP) tablet 1.5 mg  1.5 mg Oral QHS Hollice EspySendil K Krishnan, MD   1.5 mg at 10/04/16 2100  . saccharomyces boulardii (FLORASTOR) capsule 250 mg  250 mg Oral BID Hollice EspySendil K Krishnan, MD   250 mg at 10/05/16 1103  . simvastatin (ZOCOR) tablet 10 mg  10 mg Oral QHS Hollice EspySendil K Krishnan, MD   10 mg at 10/04/16 2059  . sodium chloride flush (NS) 0.9 % injection 3 mL  3 mL Intravenous Q12H Hollice EspySendil K Krishnan, MD   3 mL at 10/05/16 1104  . venlafaxine XR (EFFEXOR-XR) 24 hr capsule 75 mg  75 mg Oral Q breakfast Hollice EspySendil K Krishnan, MD   75 mg at 10/05/16 [id number]  . zolpidem (AMBIEN) tablet 5 mg  5 mg Oral QHS PRN Hollice EspySendil K Krishnan, MD   5 mg at 10/05/16 [id number]     Discharge Medications: Please see discharge summary for a list of discharge medications.  Relevant Imaging Results:  Relevant Lab Results:   Additional Information SSN [id number]  Arlyss RepressHarrison, Erandi Lemma F, LCSW

## 2016-10-05 NOTE — Clinical Social Work Note (Signed)
Clinical Social Work Assessment  Patient Details  Name: Maurice GravelHerman M Coles MRN: [id number] Date of Birth: 1950-09-30  Date of referral:  10/05/16               Reason for consult:  Facility Placement                Permission sought to share information with:  Facility Industrial/product designerContact Representative Permission granted to share information::  Yes, Verbal Permission Granted  Name::        Agency::     Relationship::     Contact Information:     Housing/Transportation Living arrangements for the past 2 months:  Skilled Nursing Facility Source of Information:  Patient Patient Interpreter Needed:  None Criminal Activity/Legal Involvement Pertinent to Current Situation/Hospitalization:  No - Comment as needed Significant Relationships:  Siblings Lives with:  Facility Resident Do you feel safe going back to the place where you live?  Yes Need for family participation in patient care:  No (Coment)  Care giving concerns:  Patient has been at current SNF for 12 years and continues to need SNF for care.    Social Worker assessment / plan:  CSW spoke with patient at bedside regarding discharge planning. Patient reports that [facility] and is okay with returning to facility.  CSW informed patient that he could notify his nurse if he has any questions for CSW. CSW will continue to follow patient for discharge planning.   Employment status:  Disabled (Comment on whether or not currently receiving Disability) Insurance information:  Managed Medicare PT Recommendations:  Skilled Nursing Facility Information / Referral to community resources:     Patient/Family's Response to care:  Patient agreeable to SNF, reporting that he is okay returning to [facility].   Patient/Family's Understanding of and Emotional Response to Diagnosis, Current Treatment, and Prognosis:  Patient verbalized understanding of diagnosis, current treatment and prognosis, noting that he is not able to  return home yet although some of his family would like him to return home. Patient pleasant when speaking with CSW and appears to be happy aeb smile.   Emotional Assessment Appearance:  Appears stated age Attitude/Demeanor/Rapport:  Other (Cooperative ) Affect (typically observed):  Pleasant Orientation:  Oriented to Self, Oriented to Place, Oriented to  Time, Oriented to Situation Alcohol / Substance use:  Not Applicable Psych involvement (Current and /or in the community):  No (Comment)  Discharge Needs  Concerns to be addressed:  No discharge needs identified Readmission within the last 30 days:  No Current discharge risk:  None Barriers to Discharge:  No Barriers Identified   Antionette PolesKimberly L Abdimalik Mayorquin, LCSW 10/05/2016, 12:03 PM

## 2016-10-06 LAB — TYPE AND SCREEN
ABO/RH(D): A NEG
ANTIBODY SCREEN: POSITIVE
DAT, IGG: NEGATIVE
UNIT DIVISION: 0
Unit division: 0
Unit division: 0

## 2016-10-06 LAB — BPAM RBC
BLOOD PRODUCT EXPIRATION DATE: 201803272359
BLOOD PRODUCT EXPIRATION DATE: 201803292359
Blood Product Expiration Date: 201804012359
ISSUE DATE / TIME: 201802261511
ISSUE DATE / TIME: 201802262306
ISSUE DATE / TIME: 201802271604
UNIT TYPE AND RH: 9500
Unit Type and Rh: 600
Unit Type and Rh: 600

## 2016-10-06 MED ORDER — PREMIER PROTEIN SHAKE
11.0000 [oz_av] | ORAL | Status: DC
  Administered 2016-10-06: 11 [oz_av] via ORAL
  Filled 2016-10-06: qty 325.31

## 2016-10-06 MED ORDER — OXYCODONE HCL 5 MG PO TABS: 5.0000 mg | ORAL_TABLET | Freq: Four times a day (QID) | ORAL | 0 refills | Status: DC | PRN

## 2016-10-06 NOTE — Progress Notes (Signed)
CSW contacted [facility] and confirmed patient's return to facility today 10/06/2016 at 12:30pm. CSW notified patient's RN. CSW coordinated transportation for patient. All necessary documents placed in patient's discharge packet.

## 2016-10-06 NOTE — Discharge Summary (Signed)
Physician Discharge Summary  [name] [id number] DOB: 02/01/1951 DOA: 10/04/2016  PCP: No primary care provider on file.  Admit date: 10/04/2016 Discharge date: 10/06/2016  Time spent: 35 minutes  Recommendations for Outpatient Follow-up:  1. CBC needed in about 1 week 2. Suggest plastic surgeon or PCP at facility debride wounds on sacrum 3. Needs overlay mattress on turning q 2 hourly at facility 4. Wound care instructions as below  Discharge Diagnoses:  Principal Problem:   Iron deficiency anemia Active Problems:   Lower paraplegia (HCC)   Seizure disorder (HCC)   Decubitus ulcer of coccygeal region   Hypertension   Overweight (BMI 25.0-29.9)   Discharge Condition: gaurded  Diet recommendation: hh low salt   Filed Weights   10/04/16 1514 10/04/16 1821  Weight: 101.2 kg (223 lb) 96.1 kg (211 lb 12.8 oz)    History of present illness:  66 y/o ? 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  Tx 2 U PRBC-->7.5.  trasnfused a total of 3 U--Hb on d/c 8.  Last endo 05/2013 was esophagus nl.  Rectum sewn shut at that time.  Ostomy not showing any bleeding into bag Stable for d/c  PAralegia T7-T8-paraplic with Colostomy and Suprapubic-clinically stable.  No new issue.  Monitor  Multiple decubiti --wound visualized by myself when nursing changing dressings after am rounds Sacrum/buttocks 20cm x 30cm  Right heel: 3cm x 2cm x 0.1cm Left lateral LE: 18cm x 4cm x 0.2cm area of full thickness tissue loss. Bilateral LEs with pale, dry skin with plaques.Left lateral thigh: 3cm x 2cm x 0.1cm full thickness wound with beefy red wound bed,  scant serous exudate-On-going problem.  WOC nurse input appreciated--He will need OP evaluation of these wounds and [facility].  Given there is no active infection and long-standing nature-this can be arranged as OP.  Dress with calcium alginate ans reassess as OP  Fe Def-giving Blood.  Will Rx Po iron on d/c    Discharge Exam: Vitals:   10/05/16 2035 10/06/16 0546  BP: 125/62 (!) 130/55  Pulse: 75 84  Resp: 20 18  Temp: 98.1 F (36.7 C) 98.7 F (37.1 C)    General: alert pleasant oriented and in nad Cardiovascular: s1 s2 no m/r/g Respiratory: clear no added sound  Discharge Instructions   Discharge Instructions    Diet - low sodium heart healthy    Complete by:  As directed    Increase activity slowly    Complete by:  As directed      Current Discharge Medication List    CONTINUE these medications which have CHANGED   Details  oxyCODONE (OXY IR/ROXICODONE) 5 MG immediate release tablet Take 1 tablet (5 mg total) by mouth every 6 (six) hours as needed for breakthrough pain. Qty: 30 tablet, Refills: 0      CONTINUE these medications which have NOT CHANGED   Details  acetaZOLAMIDE (DIAMOX) 500 MG capsule Take 500 mg by mouth daily.    alum & mag hydroxide-simeth (MAALOX/MYLANTA) 200-200-20 MG/5ML suspension Take 30 mLs by mouth every 4 (four) hours as needed for indigestion or heartburn.    Amino Acids-Protein Hydrolys (FEEDING  SUPPLEMENT, PRO-STAT SUGAR FREE 64,) LIQD Take 30 mLs by mouth 4 (four) times daily.    carboxymethylcellulose 1 % ophthalmic solution Place 1 drop into both eyes 4 (four) times daily.     divalproex (DEPAKOTE) 500 MG DR tablet Take 500 mg by mouth at bedtime.     docusate sodium (COLACE) 100 MG capsule Take 200 mg by mouth 2 (two) times daily.     ferrous sulfate 325 (65 FE) MG tablet Take 325 mg by mouth 2 (two) times daily with a meal.    furosemide (LASIX) 20 MG tablet Take 60 mg by mouth 2 (two) times daily.     latanoprost (XALATAN) 0.005 % ophthalmic solution Place 1 drop into both eyes at bedtime.    loratadine (CLARITIN) 10 MG tablet Take 10 mg by mouth daily.     Menthol, Topical Analgesic, (BIOFREEZE) 10 % LIQD Apply 1 application topically 2 (two) times daily.    metoprolol succinate (TOPROL-XL) 25 MG 24 hr tablet Take 12.5 mg by mouth daily.     mirabegron ER (MYRBETRIQ) 25 MG TB24 tablet Take 25 mg by mouth daily.    Multiple Vitamin (MULTIVITAMIN WITH MINERALS) TABS tablet Take 1 tablet by mouth daily.    polyethylene glycol (MIRALAX / GLYCOLAX) packet Take 17 g by mouth daily as needed for mild constipation or moderate constipation.     ranitidine (ZANTAC) 150 MG tablet Take 150 mg by mouth daily.     rOPINIRole (REQUIP) 1 MG tablet Take 1.5 mg by mouth at bedtime.    saccharomyces boulardii (FLORASTOR) 250 MG capsule Take 250 mg by mouth 2 (two) times daily.    sennosides-docusate sodium (SENOKOT-S) 8.6-50 MG tablet Take 1 tablet by mouth 2 (two) times daily.     simvastatin (ZOCOR) 10 MG tablet Take 10 mg by mouth at bedtime.    sodium chloride (OCEAN) 0.65 % SOLN nasal spray Place 1 spray into both nostrils every 3 (three) hours as needed for congestion.    Trolamine Salicylate (ASPERCREME EX) Apply 1 application topically daily. Pt applies to right shoulder.    venlafaxine XR (EFFEXOR XR) 75 MG 24 hr capsule Take 75 mg by mouth daily with breakfast.    vitamin C (ASCORBIC ACID) 500 MG tablet Take 500 mg by mouth 2 (two) times daily.     Vitamin D, Ergocalciferol, (DRISDOL) 50000 units CAPS capsule Take 50,000 Units by mouth every 30 (thirty) days. Pt takes on the 15th of every month.    zinc sulfate 220 (50 Zn) MG capsule Take 220 mg by mouth daily.      STOP taking these medications     oxyCODONE-acetaminophen (PERCOCET) 7.5-325 MG tablet      zolpidem (AMBIEN) 10 MG tablet        Allergies  Allergen Reactions  . Ciprofloxacin Itching  . Minocycline Hcl  Itching      The results of significant diagnostics from this hospitalization (including imaging, microbiology, ancillary and laboratory) are listed below for reference.    Significant Diagnostic Studies: No results found.  Microbiology: No results found for this or any previous visit (from the past 240 hour(s)).   Labs: Basic Metabolic Panel:  Recent Labs Lab 10/04/16 1310  NA 137  K 3.4*  CL 97*  CO2 33*  GLUCOSE 114*  BUN 53*  CREATININE 0.84  CALCIUM 8.8*   Liver Function Tests: No results for input(s): AST, ALT, ALKPHOS, BILITOT, PROT, ALBUMIN in the last 168 hours. No results for input(s):  LIPASE, AMYLASE in the last 168 hours. No results for input(s): AMMONIA in the last 168 hours. CBC:  Recent Labs Lab 10/04/16 1310 10/05/16 1019 10/05/16 2001  WBC 10.2 8.9 9.6  HGB 5.9* 7.5* 8.4*  HCT 22.7* 27.5* 29.7*  MCV 70.7* 72.9* 73.7*  PLT 599* 542* 545*   Cardiac Enzymes: No results for input(s): CKTOTAL, CKMB, CKMBINDEX, TROPONINI in the last 168 hours. BNP: BNP (last 3 results) No results for input(s): BNP in the last 8760 hours.  ProBNP (last 3 results) No results for input(s): PROBNP in the last 8760 hours.  CBG:  Recent Labs Lab 10/04/16 1247  GLUCAP 122*       Signed:  Rhetta Mura MD   [facility] 10/06/2016, 10:15 AM

## 2016-10-06 NOTE — Progress Notes (Signed)
[facility] via [facility]. Report called to nurse Wilkie AyeKristy at facility. All belongings sent with patient. All wound dressings changed prior to D/C. Colostomy and suprapubic cath intact upon D/C.

## 2016-10-06 NOTE — Progress Notes (Addendum)
Initial Nutrition Assessment  DOCUMENTATION CODES:   Not applicable  INTERVENTION:  - Continue 30 mL Prostat QID, each supplement provides 100 kcal and 15 grams of protein (total of 400 kcal and 60 grams of protein).  - Will order Premier Protein once/day, this supplement provides 160 kcal and 30 grams of protein. - Continue to encourage PO intakes of meals and supplements.  - Recommend zinc x10-14 days and ascorbic acid to promote wound healing.   NUTRITION DIAGNOSIS:   Increased nutrient needs related to wound healing as evidenced by estimated needs.  GOAL:   Patient will meet greater than or equal to 90% of their needs  MONITOR:   PO intake, Supplement acceptance, Weight trends, Labs, Skin, I & O's  REASON FOR ASSESSMENT:   Low Braden  ASSESSMENT:   66 y.o. male with medical history significant of paraplegia with secondary colostomy and suprapubic catheter who is bedbound with a history of chronic iron deficiency anemia brought in from his nursing facility after feeling weak and found to have low blood. count with Hgb of 5.9.   Pt seen for low Braden. BMI indicates overweight status. IBW adjusted based on paraplegia. Pt discussed during rounds yesterday AM. At that time it was mentioned that pt may be able to d/c todayPt reports poor appetite at facility for several weeks PTA. This was not associated with N/V or abdominal pain.   Per chart review, pt consumed 100% of breakfast, 0% of lunch, and 25% of dinner yesterday. He also consumed all 4 packets of Prostat. This intake equates to ~1360 kcal (71% minimum estimated kcal need) and 95 grams of protein (83% minimum estimate protein need).  Pt has ordered breakfast this AM which consists of oatmeal, scrambled eggs, biscuit, milk, and coffee (~700 kcal and 27 grams of protein). Will order Premier Protein once/day.  Physical assessment shows no muscle or fat wasting. Mild generalized edema and moderate BLE edema which may be  artificially increasing weight. Per chart review, no weight recorded since June when weight was 98.4 kg (2.3 kg higher than CBW). Will monitor weight trends closely.   Medications reviewed; 200 mg Colace BID, 20 mg oral Pepcid once/day, 325 mg ferrous sulfate BID, 20 mg IV Lasix x1 dose yesterday, 60 mg oral Lasix BID, 250 mg Florastor BID.  Labs reviewed; K: 3.4 mmol/L, Cl: 97 mmol/L, BUN: 53 mg/dL, Ca: 8.8 mg/dL.   Diet Order:  DIET DYS 3 Room service appropriate? No; Fluid consistency: Thin  Skin:   Stage 4 circumferential buttocks, Stage 3 L ankle, R leg and R ankle wounds with no documented staging.   Last BM:  2/27, output via colostomy  Height:   Ht Readings from Last 1 Encounters:  10/04/16 6\' 1"  (1.854 m)    Weight:   Wt Readings from Last 1 Encounters:  10/04/16 211 lb 12.8 oz (96.1 kg)    Ideal Body Weight:  79.46 kg  BMI:  Body mass index is 27.94 kg/m.  Estimated Nutritional Needs:   Kcal:  1925-2115 (20-22 kcal/kg)  Protein:  115-125 grams (1.2- 1.3 grams/kg)  Fluid:  >/= 2 L/day  EDUCATION NEEDS:   No education needs identified at this time    Trenton GammonJessica Sheri Prows, MS, RD, LDN, CNSC Inpatient Clinical Dietitian Pager # [phone] After hours/weekend pager # [phone]

## 2016-10-12 ENCOUNTER — Ambulatory Visit: Payer: Medicare Other | Admitting: Gastroenterology

## 2016-10-21 ENCOUNTER — Ambulatory Visit (INDEPENDENT_AMBULATORY_CARE_PROVIDER_SITE_OTHER): Payer: Medicare Other | Admitting: Gastroenterology

## 2016-10-21 ENCOUNTER — Encounter: Payer: Self-pay | Admitting: Gastroenterology

## 2016-10-21 VITALS — BP 154/70 | HR 80

## 2016-10-21 DIAGNOSIS — D509 Iron deficiency anemia, unspecified: Secondary | ICD-10-CM | POA: Diagnosis not present

## 2016-10-21 NOTE — Progress Notes (Addendum)
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  He is here with to EMS workers on a stretcher.  He has a history of iron deficiency anemia at least dating back to 05/2013 at which time he had EGD and colonoscopy (via ostomy) for evaluation of that by Dr. Evette Cristal.  Colonoscopy was normal and it is noted that his rectum was sewn shut. Upper endoscopy revealed normal esophagus, mild erythematous mucosa in the stomach but was otherwise normal with normal appearing duodenum. Nothing found to explain anemia.  He was recently hospitalized for multiple sacral ulcers. In his recent discharge summary it notes his iron deficiency anemia and that is thought to be secondary to his sacral wounds. He is supposed to be seeing plastic surgery as per the discharge summary. His hemoglobin has been as low as 6 g recently. He has received blood transfusions. Most recent hemoglobin is 8.9 g just yesterday. MCV is low at 76.3. Reticulocyte count normal at 1.9%. Recent ferritin is 22, serum iron 13, iron saturation 5%. TIBC is normal at 262. He is on ferrous sulfate iron supplements orally. He denies seeing any blood in his ostomy bag. He says that he changes his own bag. He has noticed some darkish colored stools, but thinks that is secondary to the iron pills.  No black stools.  He does have some loose stools, but is receiving MiraLAX and some other laxatives to prevent him from becoming constipated from the iron supplements and other medications.   Past Medical History:  Diagnosis Date  . Anemia   . Anxiety   . Blood clotting disorder  (HCC)   . Cellulitis   . CHF (congestive heart failure) (HCC)   . Coronary artery disease   . Dysphagia, oral phase   . GERD (gastroesophageal reflux disease)   . Hematuria 11/17/2015  . Hypertension   . Irritable bowel syndrome   . Muscle weakness (generalized)   . Neurogenic dysfunction of the urinary bladder   . Obesity   . Paraplegia (HCC)   . Pneumonia   . Seizure (HCC)   . Urinary tract infection    Past Surgical History:  Procedure Laterality Date  . COLON SURGERY     colonostomy  . COLONOSCOPY WITH PROPOFOL N/A 05/25/2013   Procedure: COLONOSCOPY WITH PROPOFOL;  Surgeon: Graylin Shiver, MD;  Location: [facility];  Service: Endoscopy;  Laterality: N/A;  . colonostomy    . ESOPHAGOGASTRODUODENOSCOPY (EGD) WITH PROPOFOL N/A 05/25/2013   Procedure: ESOPHAGOGASTRODUODENOSCOPY (EGD) WITH PROPOFOL;  Surgeon: Graylin Shiver, MD;  Location: [facility];  Service: Endoscopy;  Laterality: N/A;    reports that he has never smoked. He has never used smokeless tobacco. He reports that he does not drink alcohol or use drugs. family history includes Brain cancer in his brother; Heart failure in his father and mother. Allergies  Allergen Reactions  . Ciprofloxacin Itching  . Minocycline Hcl Itching      Outpatient Encounter Prescriptions as of 10/21/2016  Medication Sig  . acetaZOLAMIDE (DIAMOX) 500 MG capsule Take 500 mg by mouth daily.  Marland Kitchen alum & mag hydroxide-simeth (MAALOX/MYLANTA) 200-200-20 MG/5ML  suspension Take 30 mLs by mouth every 4 (four) hours as needed for indigestion or heartburn.  . Amino Acids-Protein Hydrolys (FEEDING SUPPLEMENT, PRO-STAT SUGAR FREE 64,) LIQD Take 30 mLs by mouth 4 (four) times daily.  . ARIPiprazole (ABILIFY) 15 MG tablet Take 15 mg by mouth daily.  . carboxymethylcellulose 1 % ophthalmic solution Place 1 drop into both eyes 4 (four) times daily.   . divalproex (DEPAKOTE) 500 MG DR tablet Take 500 mg by mouth at bedtime.   . docusate sodium (COLACE)  100 MG capsule Take 200 mg by mouth 2 (two) times daily.   . ferrous sulfate 325 (65 FE) MG tablet Take 325 mg by mouth 2 (two) times daily with a meal.  . furosemide (LASIX) 20 MG tablet Take 60 mg by mouth 2 (two) times daily.  Marland Kitchen. latanoprost (XALATAN) 0.005 % ophthalmic solution Place 1 drop into both eyes at bedtime.  Marland Kitchen. loratadine (CLARITIN) 10 MG tablet Take 10 mg by mouth daily.   . Melatonin 5 MG TABS Take 5 mg by mouth at bedtime as needed.  . Menthol, Topical Analgesic, (BIOFREEZE) 10 % LIQD Apply 1 application topically 2 (two) times daily.  . metoprolol succinate (TOPROL-XL) 25 MG 24 hr tablet Take 12.5 mg by mouth daily.   . mirabegron ER (MYRBETRIQ) 25 MG TB24 tablet Take 25 mg by mouth daily.  . Multiple Vitamin (MULTIVITAMIN WITH MINERALS) TABS tablet Take 1 tablet by mouth daily.  . Multiple Vitamins-Minerals (CERTAGEN PO) Take 1 tablet by mouth daily.  . pantoprazole (PROTONIX) 40 MG tablet Take 40 mg by mouth 2 (two) times daily.  . polyethylene glycol (MIRALAX / GLYCOLAX) packet Take 17 g by mouth daily as needed for mild constipation or moderate constipation.   . potassium chloride (K-DUR) 10 MEQ tablet Take 10 mEq by mouth daily.  . ranitidine (ZANTAC) 150 MG tablet Take 150 mg by mouth daily.   Marland Kitchen. rOPINIRole (REQUIP) 1 MG tablet Take 1.5 mg by mouth at bedtime.  . saccharomyces boulardii (FLORASTOR) 250 MG capsule Take 250 mg by mouth 2 (two) times daily.  . sennosides-docusate sodium (SENOKOT-S) 8.6-50 MG tablet Take 1 tablet by mouth 2 (two) times daily.   . simvastatin (ZOCOR) 10 MG tablet Take 10 mg by mouth at bedtime.  . sodium chloride (OCEAN) 0.65 % SOLN nasal spray Place 1 spray into both nostrils every 3 (three) hours as needed for congestion.  Terrance Mass. Trolamine Salicylate (ASPERCREME EX) Apply 1 application topically daily. Pt applies to right shoulder.  . venlafaxine XR (EFFEXOR XR) 75 MG 24 hr capsule Take 75 mg by mouth daily with breakfast.  . vitamin C (ASCORBIC  ACID) 500 MG tablet Take 500 mg by mouth 2 (two) times daily.   . Vitamin D, Ergocalciferol, (DRISDOL) 50000 units CAPS capsule Take 50,000 Units by mouth every 30 (thirty) days. Pt takes on the 15th of every month.  . zinc sulfate 220 (50 Zn) MG capsule Take 220 mg by mouth daily.  . [DISCONTINUED] oxyCODONE (OXY IR/ROXICODONE) 5 MG immediate release tablet Take 1 tablet (5 mg total) by mouth every 6 (six) hours as needed for breakthrough pain.   No facility-administered encounter medications on file as of 10/21/2016.      REVIEW OF SYSTEMS  : All other systems reviewed and negative except where noted in the History of Present Illness.   PHYSICAL EXAM: BP (!) 154/70   Pulse 80  General: Well developed white male in no acute distress Head: Normocephalic and  atraumatic Eyes:  sclerae anicteric,conjunctive pink. Ears: Normal auditory acuity Neck: Supple, no masses.  Lungs: Clear throughout to auscultation Heart: Regular rate and rhythm Abdomen: Soft, nontender, non distended. No masses or hepatomegaly noted. Normal bowel sounds Rectal: Musculoskeletal: Symmetrical with no gross deformities  Skin: No lesions on visible extremities Extremities: No edema  Neurological: Alert oriented x 4, grossly non-focal Psychological:  Alert and cooperative. 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 *IDA:  Has EGD and colonoscopy that were negative for source of anemia in 05/2013.  Anemia has been persistent and worsening recently.  From notes at recent hospitalization It appears that they think the bleeding is due  to his multiple sacral wounds.  Has required several blood transfusions recently. Does not seem to be responding to oral iron supplementation twice daily. There is no signs of overt GI bleeding in his ostomy bag. I'm going to have him set up to receive IV iron infusions with feraheme. We'll check stool for occult blood through his ostomy 3. If it is positive then instead of repeating EGD and colonoscopy may possibly consider capsule endoscopy.  If negative then no further GI evaluation at this time.  Needs to see plastic surgery for his sacral wound. *O2 dependent  CC:  No ref. provider found  Addendum: Reviewed and agree with management. Beverley Fiedler, MD

## 2016-10-21 NOTE — Patient Instructions (Signed)
We have scheduled you for [facility] on 10-29-16 at 1 pm.   Follow the instructions on the Hemoccult cards and mail them back to us when you are finished or you may take them directly to the lab in the basement of the [facility] building. We will call you with the results.

## 2016-10-28 ENCOUNTER — Other Ambulatory Visit: Payer: Medicare Other

## 2016-10-29 ENCOUNTER — Other Ambulatory Visit: Payer: Self-pay

## 2016-10-29 ENCOUNTER — Other Ambulatory Visit (INDEPENDENT_AMBULATORY_CARE_PROVIDER_SITE_OTHER): Payer: Medicare Other

## 2016-10-29 ENCOUNTER — Ambulatory Visit (HOSPITAL_COMMUNITY)
Admission: RE | Admit: 2016-10-29 | Discharge: 2016-10-29 | Disposition: A | Discharge status: Home / Self Care | Payer: Medicare Other | Source: Ambulatory Visit | Attending: Internal Medicine | Admitting: Internal Medicine

## 2016-10-29 DIAGNOSIS — D509 Iron deficiency anemia, unspecified: Secondary | ICD-10-CM | POA: Diagnosis not present

## 2016-10-29 LAB — HEMOCCULT SLIDES (X 3 CARDS)
FECAL OCCULT BLD: NEGATIVE
OCCULT 1: NEGATIVE
OCCULT 2: NEGATIVE
OCCULT 3: NEGATIVE
OCCULT 4: NEGATIVE
OCCULT 5: NEGATIVE

## 2016-10-29 MED ORDER — FERUMOXYTOL INJECTION 510 MG/17 ML
510.0000 mg | INTRAVENOUS | Status: DC
  Administered 2016-10-29: 13:00:00 510 mg via INTRAVENOUS
  Filled 2016-10-29: qty 17

## 2016-10-29 NOTE — Discharge Instructions (Signed)

## 2016-11-05 ENCOUNTER — Ambulatory Visit (HOSPITAL_COMMUNITY)
Admission: RE | Admit: 2016-11-05 | Discharge: 2016-11-05 | Disposition: A | Discharge status: Home / Self Care | Payer: Medicare Other | Source: Ambulatory Visit | Attending: Internal Medicine | Admitting: Internal Medicine

## 2016-11-05 DIAGNOSIS — D509 Iron deficiency anemia, unspecified: Secondary | ICD-10-CM | POA: Diagnosis not present

## 2016-11-05 MED ORDER — SODIUM CHLORIDE 0.9 % IV SOLN
510.0000 mg | INTRAVENOUS | Status: DC
  Administered 2016-11-05: 13:00:00 510 mg via INTRAVENOUS
  Filled 2016-11-05: qty 17

## 2017-04-13 ENCOUNTER — Ambulatory Visit (INDEPENDENT_AMBULATORY_CARE_PROVIDER_SITE_OTHER): Payer: Medicare Other | Admitting: Internal Medicine

## 2017-04-13 ENCOUNTER — Other Ambulatory Visit (INDEPENDENT_AMBULATORY_CARE_PROVIDER_SITE_OTHER): Payer: Medicare Other

## 2017-04-13 ENCOUNTER — Encounter: Payer: Self-pay | Admitting: *Deleted

## 2017-04-13 VITALS — BP 140/80 | HR 84

## 2017-04-13 DIAGNOSIS — E663 Overweight: Secondary | ICD-10-CM

## 2017-04-13 DIAGNOSIS — D509 Iron deficiency anemia, unspecified: Secondary | ICD-10-CM

## 2017-04-13 LAB — IBC PANEL
IRON: 11 ug/dL — AB (ref 42–165)
Saturation Ratios: 4.5 % — ABNORMAL LOW (ref 20.0–50.0)
Transferrin: 176 mg/dL — ABNORMAL LOW (ref 212.0–360.0)

## 2017-04-13 LAB — IGA: IgA: 618 mg/dL — ABNORMAL HIGH (ref 68–378)

## 2017-04-13 LAB — FERRITIN: Ferritin: 88.1 ng/mL (ref 22.0–322.0)

## 2017-04-13 NOTE — Patient Instructions (Signed)
Your physician has requested that you go to the basement for the following lab work before leaving today: IBC, ferritin, IgA, TTG  Your provider has ordered Cologuard testing as an option for colon cancer screening. This is performed by [facility] and may be out of network with your insurance. PRIOR to completing the test, it is YOUR respoWm. Wrigley Jr. Companynsibility to contact your insurance about covered benefits for this test. Your out of pocket expense could be anywhere from $0.00 to $649.00.   When you call to check coverage with your insurer, please provide the following information:   -The ONLY provider of C[facility]uard is [facility]  - CPT code for Cologuard is [phone].  Chiropractor[facility] NPI # [id number]  -[facility] Tax ID # [id number]   We have already sent your demographic and insurance information to [facility] (phone number [phone]) and they should contact you within the next week regarding your test. If you have not heard from them within the next week, please call our office at [phone].  Please follow up with Dr Rhea BeltonPyrtle in 4 months.  If you are age 67 or older, your body mass index should be between 23-30. Your There is no height or weight on file to calculate BMI. If this is out of the aforementioned range listed, please consider follow up with your Primary Care Provider.  If you are age 67 or younger, your body mass index should be between 19-25. Your There is no height or weight on file to calculate BMI. If this is out of the aformentioned range listed, please consider follow up with your Primary Care Provider.

## 2017-04-13 NOTE — Progress Notes (Signed)
Subjective:    Patient ID: [name], male    DOB: 08-May-1951, 66 y.o.   MRN: [id number]  HPI [name] is a 66 year old male with a long-standing history of T11 paraplegia, history of sacral decubitus ulcerations with permanent end colostomy, chronic Foley after prior suprapubic catheter leakage, CAD, seizure disorder, hypertension, obesity and option dependency who returns with ongoing iron deficiency anemia. He is brought today by EMS on a stretcher.  He lives at [facility] where he has lived for 12 years. He was previously from [place], [place]  He was seen on 10/21/2016 by Doug Sou, PA-C to further evaluate iron deficiency anemia. I saw him for the same in January 2016. He had prior upper endoscopy and colonoscopy by Dr. Evette Cristal in October 2014. It was felt that his ongoing anemia and iron deficiency was secondary to ongoing blood loss from his sacral decubitus ulcerations. He was given IV iron after his visit with Shanda Bumps in March. He received 2 doses of Feraheme. He returned fecal occult blood testing which was negative 5.  He denies GI complaint. He reports that he has a history of constipation but takes laxatives. All of his stool is per ostomy and he reports this has a dark green color with oral iron. He reports oral iron makes him have an upset stomach and he would prefer IV iron if possible. He denies visible blood in his stool and melena. No abdominal pain. Heartburn is well-controlled with current acid suppression therapy. He denies breakthrough heartburn and indigestion. Denies nausea and vomiting. Denies dysphagia and odynophagia.  He is taking pantoprazole 40 mg twice daily. He occasionally uses Zantac. He uses MiraLAX and senna as a laxative. Probiotic is also on his medication list but he is unsure if he is taking this. He also has ferrous sulfate 325 mg listed twice a day.  He enjoys painting and sitting outside in the  sunshine. Tobacco or alcohol use.  Review of Systems As per history of present illness, otherwise negative  Current Medications, Allergies, Past Medical History, Past Surgical History, Family History and Social History were reviewed in [employer] record.     Objective:   Physical Exam BP 140/80   Pulse 84  Constitutional: Well-developed and well-nourished. No distress. HEENT: Normocephalic and atraumatic. Oropharynx is clear and moist. Conjunctivae are normal.  No scleral icterus. Neck: Neck supple. Trachea midline. Cardiovascular: Normal rate, regular rhythm and intact distal pulses. No M/R/G Pulmonary/chest: Effort normal and breath sounds normal. No wheezing, rales or rhonchi. Abdominal: Soft, nontender, nondistended. Bowel sounds active throughout. Ostomy in the left lower quadrant draining formed, dry, dark green stool Extremities: no clubbing, cyanosis, or edema Neurological: Alert and oriented to person place and time. Skin: Skin is warm and dry. Psychiatric: Normal mood and affect. Behavior is normal.  Labs reviewed from his rehabilitation center dated 04/01/2017 WBC 9.1, hemoglobin 9.5, MCV 79.6, platelet 479 Vitamin B12 709 BMP with normal limits except for mild elevation in glucose at 1:30 in BUN at 43, creatinine is 0.67. Sodium and potassium normal     Assessment & Plan:  66 year old male with a long-standing history of T11 paraplegia, history of sacral decubitus ulcerations with permanent end colostomy, chronic Foley after prior suprapubic catheter leakage, CAD, seizure disorder, hypertension, obesity and option dependency who returns with ongoing iron deficiency anemia.  1. IDA -- Patient with iron deficiency anemia with negative GI evaluation 2014 and heme-negative stool earlier this year.  Patient does have a history of multiple sacral decubitus ulcerations with bleeding felt to explain anemia and iron deficiency. He did receive IV iron earlier  this year and I expect he may need additional IV iron doses. Will repeat iron studies today and check a celiac panel. Given his heme-negative stool, repeating endoscopies is very low yield. We discussed this together today. No history of colon polyps or family history of colon cancer but for completeness I will order Cologuard testing. He understands if this is positive he would need colonoscopy and if so he would be willing to undergo colonoscopy. --Return in about 4 months, sooner if necessary  25 minutes spent with the patient today. Greater than 50% was spent in counseling and coordination of care with the patient

## 2017-04-14 LAB — TISSUE TRANSGLUTAMINASE, IGA: (TTG) AB, IGA: 1 U/mL

## 2017-04-19 ENCOUNTER — Other Ambulatory Visit: Payer: Self-pay

## 2017-04-19 DIAGNOSIS — D509 Iron deficiency anemia, unspecified: Secondary | ICD-10-CM

## 2017-05-09 ENCOUNTER — Ambulatory Visit (HOSPITAL_COMMUNITY): Admission: RE | Admit: 2017-05-09 | Payer: Medicare Other | Source: Ambulatory Visit

## 2017-05-09 ENCOUNTER — Ambulatory Visit (HOSPITAL_COMMUNITY)
Admission: RE | Admit: 2017-05-09 | Discharge: 2017-05-09 | Disposition: A | Discharge status: Home / Self Care | Payer: Medicare Other | Source: Ambulatory Visit | Attending: Internal Medicine | Admitting: Internal Medicine

## 2017-05-09 DIAGNOSIS — D509 Iron deficiency anemia, unspecified: Secondary | ICD-10-CM | POA: Diagnosis not present

## 2017-05-09 MED ORDER — SODIUM CHLORIDE 0.9 % IV SOLN
INTRAVENOUS | Status: DC
  Administered 2017-05-09: 13:00:00 via INTRAVENOUS

## 2017-05-09 MED ORDER — SODIUM CHLORIDE 0.9 % IV SOLN
510.0000 mg | Freq: Once | INTRAVENOUS | Status: AC
  Administered 2017-05-09: 510 mg via INTRAVENOUS
  Filled 2017-05-09: qty 17

## 2017-05-09 NOTE — Progress Notes (Signed)
Tolerated Feraheme infusion without event. Called EMS for return to [place].

## 2017-05-09 NOTE — Discharge Instructions (Signed)
Ferumoxytol injection / Feraheme infusion °What is this medicine? °FERUMOXYTOL is an iron complex. Iron is used to make healthy red blood cells, which carry oxygen and nutrients throughout the body. This medicine is used to treat iron deficiency anemia in people with chronic kidney disease. °This medicine may be used for other purposes; ask your health care provider or pharmacist if you have questions. °COMMON BRAND NAME(S): Feraheme °What should I tell my health care provider before I take this medicine? °They need to know if you have any of these conditions: °-anemia not caused by low iron levels °-high levels of iron in the blood °-magnetic resonance imaging (MRI) test scheduled °-an unusual or allergic reaction to iron, other medicines, foods, dyes, or preservatives °-pregnant or trying to get pregnant °-breast-feeding °How should I use this medicine? °This medicine is for injection into a vein. It is given by a health care professional in a hospital or clinic setting. °Talk to your pediatrician regarding the use of this medicine in children. Special care may be needed. °Overdosage: If you think you have taken too much of this medicine contact a poison control center or emergency room at once. °NOTE: This medicine is only for you. Do not share this medicine with others. °What if I miss a dose? °It is important not to miss your dose. Call your doctor or health care professional if you are unable to keep an appointment. °What may interact with this medicine? °This medicine may interact with the following medications: °-other iron products °This list may not describe all possible interactions. Give your health care provider a list of all the medicines, herbs, non-prescription drugs, or dietary supplements you use. Also tell them if you smoke, drink alcohol, or use illegal drugs. Some items may interact with your medicine. °What should I watch for while using this medicine? °Visit your doctor or healthcare  professional regularly. Tell your doctor or healthcare professional if your symptoms do not start to get better or if they get worse. You may need blood work done while you are taking this medicine. °You may need to follow a special diet. Talk to your doctor. Foods that contain iron include: whole grains/cereals, dried fruits, beans, or peas, leafy green vegetables, and organ meats (liver, kidney). °What side effects may I notice from receiving this medicine? °Side effects that you should report to your doctor or health care professional as soon as possible: °-allergic reactions like skin rash, itching or hives, swelling of the face, lips, or tongue °-breathing problems °-changes in blood pressure °-feeling faint or lightheaded, falls °-fever or chills °-flushing, sweating, or hot feelings °-swelling of the ankles or feet °Side effects that usually do not require medical attention (report to your doctor or health care professional if they continue or are bothersome): °-diarrhea °-headache °-nausea, vomiting °-stomach pain °This list may not describe all possible side effects. Call your doctor for medical advice about side effects. You may report side effects to FDA at 1-800-FDA-1088. °Where should I keep my medicine? °This drug is given in a hospital or clinic and will not be stored at home. °NOTE: This sheet is a summary. It may not cover all possible information. If you have questions about this medicine, talk to your doctor, pharmacist, or health care provider. °© 2018 [employer] Standard (2015-08-28 12:41:49) ° °

## 2017-05-16 ENCOUNTER — Encounter (HOSPITAL_COMMUNITY): Payer: Medicare Other

## 2017-07-01 ENCOUNTER — Encounter (HOSPITAL_COMMUNITY): Payer: Self-pay | Admitting: Emergency Medicine

## 2017-07-01 ENCOUNTER — Emergency Department (HOSPITAL_COMMUNITY)
Admission: EM | Admit: 2017-07-01 | Discharge: 2017-07-01 | Disposition: A | Discharge status: Home / Self Care | Payer: Medicare Other | Attending: Emergency Medicine | Admitting: Emergency Medicine

## 2017-07-01 DIAGNOSIS — I110 Hypertensive heart disease with heart failure: Secondary | ICD-10-CM | POA: Diagnosis not present

## 2017-07-01 DIAGNOSIS — Z960 Presence of urogenital implants: Secondary | ICD-10-CM | POA: Insufficient documentation

## 2017-07-01 DIAGNOSIS — Z79899 Other long term (current) drug therapy: Secondary | ICD-10-CM | POA: Insufficient documentation

## 2017-07-01 DIAGNOSIS — I509 Heart failure, unspecified: Secondary | ICD-10-CM | POA: Insufficient documentation

## 2017-07-01 DIAGNOSIS — R1030 Lower abdominal pain, unspecified: Secondary | ICD-10-CM | POA: Insufficient documentation

## 2017-07-01 DIAGNOSIS — R109 Unspecified abdominal pain: Secondary | ICD-10-CM

## 2017-07-01 DIAGNOSIS — I2510 Atherosclerotic heart disease of native coronary artery without angina pectoris: Secondary | ICD-10-CM | POA: Diagnosis not present

## 2017-07-01 DIAGNOSIS — Z933 Colostomy status: Secondary | ICD-10-CM | POA: Diagnosis not present

## 2017-07-01 HISTORY — DX: Pressure ulcer of unspecified site, unspecified stage: L89.90

## 2017-07-01 HISTORY — DX: Atherosclerotic heart disease of native coronary artery without angina pectoris: I25.10

## 2017-07-01 HISTORY — DX: Heart failure, unspecified: I50.9

## 2017-07-01 HISTORY — DX: Peripheral vascular disease, unspecified: I73.9

## 2017-07-01 HISTORY — DX: Obstructive sleep apnea (adult) (pediatric): G47.33

## 2017-07-01 LAB — URINALYSIS, ROUTINE W REFLEX MICROSCOPIC
BILIRUBIN URINE: NEGATIVE
GLUCOSE, UA: NEGATIVE mg/dL
Ketones, ur: NEGATIVE mg/dL
NITRITE: NEGATIVE
PH: 6 (ref 5.0–8.0)
Protein, ur: NEGATIVE mg/dL
SPECIFIC GRAVITY, URINE: 1.003 — AB (ref 1.005–1.030)

## 2017-07-01 LAB — CBC WITH DIFFERENTIAL/PLATELET
Basophils Absolute: 0 10*3/uL (ref 0.0–0.1)
Basophils Relative: 0 %
EOS ABS: 0.2 10*3/uL (ref 0.0–0.7)
EOS PCT: 2 %
HCT: 35.1 % — ABNORMAL LOW (ref 39.0–52.0)
Hemoglobin: 10.1 g/dL — ABNORMAL LOW (ref 13.0–17.0)
LYMPHS ABS: 1.1 10*3/uL (ref 0.7–4.0)
Lymphocytes Relative: 12 %
MCH: 22.7 pg — ABNORMAL LOW (ref 26.0–34.0)
MCHC: 28.8 g/dL — ABNORMAL LOW (ref 30.0–36.0)
MCV: 78.9 fL (ref 78.0–100.0)
MONO ABS: 0.9 10*3/uL (ref 0.1–1.0)
Monocytes Relative: 10 %
NEUTROS PCT: 76 %
Neutro Abs: 6.6 10*3/uL (ref 1.7–7.7)
PLATELETS: 486 10*3/uL — AB (ref 150–400)
RBC: 4.45 MIL/uL (ref 4.22–5.81)
RDW: 19 % — AB (ref 11.5–15.5)
WBC: 8.8 10*3/uL (ref 4.0–10.5)

## 2017-07-01 LAB — COMPREHENSIVE METABOLIC PANEL
ALK PHOS: 185 U/L — AB (ref 38–126)
ALT: 17 U/L (ref 17–63)
AST: 13 U/L — ABNORMAL LOW (ref 15–41)
Albumin: 2.4 g/dL — ABNORMAL LOW (ref 3.5–5.0)
Anion gap: 4 — ABNORMAL LOW (ref 5–15)
BUN: 29 mg/dL — ABNORMAL HIGH (ref 6–20)
CALCIUM: 8.8 mg/dL — AB (ref 8.9–10.3)
CO2: 29 mmol/L (ref 22–32)
CREATININE: 0.7 mg/dL (ref 0.61–1.24)
Chloride: 102 mmol/L (ref 101–111)
Glucose, Bld: 138 mg/dL — ABNORMAL HIGH (ref 65–99)
Potassium: 4 mmol/L (ref 3.5–5.1)
SODIUM: 135 mmol/L (ref 135–145)
TOTAL PROTEIN: 8.9 g/dL — AB (ref 6.5–8.1)
Total Bilirubin: 0.2 mg/dL — ABNORMAL LOW (ref 0.3–1.2)

## 2017-07-01 NOTE — ED Notes (Signed)
[facility] called at 1415 for patient transport

## 2017-07-01 NOTE — ED Provider Notes (Addendum)
[facility]-EMERGENCY DEPT Provider Note   CSN: [id number] Arrival date & time: 07/01/17  [id number]     History   Chief Complaint Chief Complaint  Patient presents with  . Abdominal Pain    HPI [name] is a 66 y.o. male.  Patient is a 66 year old male with a history of coronary artery disease, CHF, dysphasia, hypertension, paraplegia resulting in neurogenic bladder, seizures and iron deficiency anemia being brought in from the facility today due to complaints of lower abdominal pain, generalized weakness and just not feeling great.  Patient was diagnosed with a urinary tract infection and started on antibiotics 3 days ago but states he was not feeling any better.  Patient has a suprapubic catheter and states that it was last changed 2-3 weeks ago.  Patient denies any recent fevers but states that he always has difficulty chewing and eating.  He has had a decreased appetite lately but denies any cough, shortness of breath or chest pain.    The history is provided by the patient, the nursing home and the EMS personnel.    Past Medical History:  Diagnosis Date  . Anemia   . Anxiety   . Blood clotting disorder (HCC)   . CAD (coronary artery disease)   . Cellulitis   . CHF (congestive heart failure) (HCC)   . Coronary artery disease   . Dysphagia, oral phase   . GERD (gastroesophageal reflux disease)   . Heart failure (HCC)   . Hematuria 11/17/2015  . Hypertension   . Irritable bowel syndrome   . Muscle weakness (generalized)   . Neurogenic dysfunction of the urinary bladder   . Obesity   . OSA (obstructive sleep apnea)   . Paraplegia (HCC)   . Pneumonia   . Pressure ulcer   . PVD (peripheral vascular disease) (HCC)   . Sacral decubitus ulcer    causing severe anemia  . Seizure (HCC)   . Urinary tract infection     Patient Active Problem List   Diagnosis Date Noted  . Anemia 10/04/2016  . Hypertension 10/04/2016  . Overweight (BMI  25.0-29.9) 10/04/2016  . Decubitus ulcer of foot 01/12/2016  . Hematuria 11/17/2015  . Pain in shoulder 10/27/2015  . Colostomy in place [facility](HCC) 07/13/2012  . Acute respiratory failure (HCC) 07/12/2012  . H/O CHF 07/12/2012  . Lower paraplegia (HCC) 07/12/2012  . Seizure disorder (HCC) 07/12/2012  . Decubitus ulcer of coccygeal region 07/12/2012  . Iron deficiency anemia 07/12/2012  . 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 COLON SURGERY     colonostomy  . COLONOSCOPY WITH PROPOFOL N/A 05/25/2013   Procedure: COLONOSCOPY WITH PROPOFOL;  Surgeon: Graylin ShiverSalem F Ganem, MD;  Location: [facility];  Service: Endoscopy;  Laterality: N/A;  . colonostomy    . ESOPHAGOGASTRODUODENOSCOPY (EGD) WITH PROPOFOL N/A 05/25/2013   Procedure: ESOPHAGOGASTRODUODENOSCOPY (EGD) WITH PROPOFOL;  Surgeon: Graylin ShiverSalem F Ganem, MD;  Location: [facility];  Service: Endoscopy;  Laterality: N/A;       Home Medications    Prior to Admission medications   Medication Sig Start Date End Date Taking? Authorizing Provider  acetaZOLAMIDE (DIAMOX) 500 MG capsule Take 500 mg by mouth daily.    [provider]  alum & mag hydroxide-simeth (MAALOX/MYLANTA) 200-200-20 MG/5ML suspension Take 30 mLs by mouth every 4 (four) hours as needed for indigestion or heartburn.    [provider]  Amino Acids-Protein Hydrolys (FEEDING SUPPLEMENT, PRO-STAT SUGAR FREE  64,) LIQD Take 30 mLs by mouth 4 (four) times daily.    [provider]  ARIPiprazole (ABILIFY) 15 MG tablet Take 15 mg by mouth daily.    [provider]  carboxymethylcellulose 1 % ophthalmic solution Place 1 drop into both eyes 4 (four) times daily.     [provider]  divalproex (DEPAKOTE) 500 MG DR tablet Take 500 mg by mouth at bedtime.     [provider]  docusate sodium (COLACE) 100 MG capsule Take 200 mg by mouth 2 (two) times daily.     [provider]    ferrous sulfate 325 (65 FE) MG tablet Take 325 mg by mouth 2 (two) times daily with a meal.    [provider]  furosemide (LASIX) 20 MG tablet Take 60 mg by mouth 2 (two) times daily.    [provider]  latanoprost (XALATAN) 0.005 % ophthalmic solution Place 1 drop into both eyes at bedtime.    [provider]  loratadine (CLARITIN) 10 MG tablet Take 10 mg by mouth daily.     [provider]  Melatonin 5 MG TABS Take 5 mg by mouth at bedtime as needed.    [provider]  Menthol, Topical Analgesic, (BIOFREEZE) 10 % LIQD Apply 1 application topically 2 (two) times daily.    [provider]  metoprolol succinate (TOPROL-XL) 25 MG 24 hr tablet Take 12.5 mg by mouth daily.     [provider]  mirabegron ER (MYRBETRIQ) 25 MG TB24 tablet Take 25 mg by mouth daily.    [provider]  Multiple Vitamin (MULTIVITAMIN WITH MINERALS) TABS tablet Take 1 tablet by mouth daily.    [provider]  OXYGEN Inhale into the lungs. 2 liters as needed    [provider]  pantoprazole (PROTONIX) 40 MG tablet Take 40 mg by mouth 2 (two) times daily.    [provider]  polyethylene glycol (MIRALAX / GLYCOLAX) packet Take 17 g by mouth daily as needed for mild constipation or moderate constipation.     [provider]  potassium chloride (K-DUR) 10 MEQ tablet Take 10 mEq by mouth daily.    [provider]  ranitidine (ZANTAC) 150 MG tablet Take 150 mg by mouth daily.     [provider]  rOPINIRole (REQUIP) 1 MG tablet Take 1.5 mg by mouth at bedtime.    [provider]  saccharomyces boulardii (FLORASTOR) 250 MG capsule Take 250 mg by mouth 2 (two) times daily.    [provider]  sennosides-docusate sodium (SENOKOT-S) 8.6-50 MG tablet Take 1 tablet by mouth 2 (two) times daily.     [provider]  simvastatin (ZOCOR) 10 MG tablet Take 10 mg by mouth at bedtime.     [provider]  sodium chloride (OCEAN) 0.65 % SOLN nasal spray Place 1 spray into both nostrils every 3 (three) hours as needed for congestion.    [provider]  Trolamine Salicylate (ASPERCREME EX) Apply 1 application topically daily. Pt applies to right shoulder.    [provider]  venlafaxine XR (EFFEXOR XR) 75 MG 24 hr capsule Take 75 mg by mouth daily with breakfast.    [provider]  vitamin C (ASCORBIC ACID) 500 MG tablet Take 500 mg by mouth 2 (two) times daily.     [provider]  Vitamin D, Ergocalciferol, (DRISDOL) 50000 units CAPS capsule Take 50,000 Units by mouth every 30 (thirty) days. Pt takes  on the 15th of every month.    [provider]    Family History Family History  Problem Relation Age of Onset  . Heart failure Mother   . Heart failure Father   . Brain cancer Brother        Had a tumor  . Colon cancer Neg Hx   . Colon polyps Neg Hx   . Kidney disease Neg Hx   . Gallbladder disease Neg Hx     Social History Social History   Tobacco Use  . Smoking status: Never Smoker  . Smokeless tobacco: Never Used  Substance Use Topics  . Alcohol use: No    Alcohol/week: 0.0 oz  . Drug use: No     Allergies   Ciprofloxacin; Minocycline hcl; and Tape   Review of Systems Review of Systems  All other systems reviewed and are negative.    Physical Exam Updated Vital Signs BP (!) 151/62 (BP Location: Left Arm)   Pulse 74   Temp 97.6 F (36.4 C) (Oral)   Resp 18   SpO2 94%   Physical Exam  Constitutional: He appears well-developed and well-nourished. No distress.  HENT:  Head: Normocephalic and atraumatic.  Mouth/Throat: Mucous membranes are dry. No oral lesions.  Edentulous  Eyes: Conjunctivae and EOM are normal. Pupils are equal, round, and reactive to light.  Neck: Normal range of motion. Neck supple.  Cardiovascular: Normal rate, regular rhythm and intact distal pulses.  Murmur  heard. Pulmonary/Chest: Effort normal and breath sounds normal. No respiratory distress. He has no wheezes. He has no rales.  Abdominal: Soft. He exhibits no distension. There is tenderness. There is no rebound and no guarding.  Mild tenderness around the site of the suprapubic catheter with minimal amount of urine draining from the site.  Ostomy present in the left mid abdomen with bowel that is pink and no current stool in the bag.  Musculoskeletal: Normal range of motion. He exhibits deformity. He exhibits no edema or tenderness.  Contractures of bilateral lower extremities with bandages/una boots present.  No edema present to the lower extremities.  Neurological: He is alert.  Oriented to person and had to be oriented to place but is able to answer questions appropriately  Skin: Skin is warm and dry. No rash noted. No erythema.  Psychiatric: He has a normal mood and affect. His behavior is normal.  Nursing note and vitals reviewed.    ED Treatments / Results  Labs (all labs ordered are listed, but only abnormal results are displayed) Labs Reviewed  CBC WITH DIFFERENTIAL/PLATELET - Abnormal; Notable for the following components:      Result Value   Hemoglobin 10.1 (*)    HCT 35.1 (*)    MCH 22.7 (*)    MCHC 28.8 (*)    RDW 19.0 (*)    Platelets 486 (*)    All other components within normal limits  COMPREHENSIVE METABOLIC PANEL - Abnormal; Notable for the following components:   Glucose, Bld 138 (*)    BUN 29 (*)    Calcium 8.8 (*)    Total Protein 8.9 (*)    Albumin 2.4 (*)    AST 13 (*)    Alkaline Phosphatase 185 (*)    Total Bilirubin 0.2 (*)    Anion gap 4 (*)    All other components within normal limits  URINALYSIS, ROUTINE W REFLEX MICROSCOPIC - Abnormal; Notable for the following components:   Color, Urine STRAW (*)    Specific  Gravity, Urine 1.003 (*)    Hgb urine dipstick LARGE (*)    Leukocytes, UA LARGE (*)    Bacteria, UA RARE (*)    Squamous Epithelial /  LPF 0-5 (*)    All other components within normal limits  URINE CULTURE    EKG  EKG Interpretation None       Radiology No results found.  Procedures Procedures (including critical care time)  Medications Ordered in ED Medications - No data to display   Initial Impression / Assessment and Plan / ED Course  I have reviewed the triage vital signs and the nursing notes.  Pertinent labs & imaging results that were available during my care of the patient were reviewed by me and considered in my medical decision making (see chart for details).     Patient is a chronically ill male with a history of paraplegia status post colostomy and suprapubic catheter who is bedbound presenting with concern for possible worsening infection.  However patient has no systemic symptoms.  He is having pain around his catheter site but no deep abdominal pain.  He has had some decreased appetite but no vomiting or fever.  There is no evidence of cellulitis today.  Urine has sediment but is otherwise clear.  Will change patient's suprapubic catheter and send a urine sample and culture however he will be chronically colonized.  Because of patient's prior history of anemia we will do a CBC and also will check to ensure no renal insufficiency with a CMP.  Patient denies any shortness of breath, chest pain and is not appear to have symptoms related to pneumonia, CHF or MI. CBC, CMP, UA pending.  1:33 PM Labs are reassuring.  Urine shows chronic contamination but only 6-30 white blood cells and rare bacteria.  This time do not feel that patient needs to be on an AB what is causing him to feel poorly.  Patient has no peritoneal signs or symptoms concerning for an acute abdomen here.  He has been eating and drinking without any difficulty.  Feel that patient is safe for discharge back to his facility to follow-up with his doctor on Monday.  Final Clinical Impressions(s) / ED Diagnoses   Final diagnoses:   Abdominal pain, unspecified abdominal location    ED Discharge Orders    None       Gwyneth SproutPlunkett, Mitchael Luckey, MD 07/01/17 1350    Gwyneth SproutPlunkett, Merranda Bolls, MD 07/01/17 1402

## 2017-07-01 NOTE — ED Notes (Signed)
Patient given 800 mg Magnesium Oxide PO and 60 mEq Potassium Chloride SA PO. MD notified. No further action needed.

## 2017-07-01 NOTE — Discharge Instructions (Signed)
Pt can have tylenol as needed every 6 hours for pain.  He can stop antibiotic as there does not appear to be a urinary tract infection at this time.  Labs are normal today and urine culture was sent.

## 2017-07-01 NOTE — ED Triage Notes (Signed)
Pt from [facility]. Arrived via EMS. Pt was diagnosed with a UTI 3 days ago and was put on an abx. Pt reports he is still feeling the same with abd pain, back pain, and generalized body aches. Facility staff report pt had altered mental status (report he keeps asking for bubble gum). Pt answers questions regarding his condition appropriately, but is disoriented to place and time.

## 2017-07-01 NOTE — ED Notes (Signed)
Bed: [id number] Expected date:  Expected time:  Means of arrival:  Comments: 66 yo recent UTI dx; back pain

## 2017-07-02 LAB — URINE CULTURE: CULTURE: NO GROWTH

## 2017-08-15 ENCOUNTER — Encounter: Payer: Self-pay | Admitting: Internal Medicine

## 2017-10-04 ENCOUNTER — Ambulatory Visit (INDEPENDENT_AMBULATORY_CARE_PROVIDER_SITE_OTHER): Payer: Medicare Other | Admitting: Neurology

## 2017-10-04 ENCOUNTER — Encounter: Payer: Self-pay | Admitting: Neurology

## 2017-10-04 VITALS — BP 102/55 | HR 81

## 2017-10-04 DIAGNOSIS — R410 Disorientation, unspecified: Secondary | ICD-10-CM | POA: Diagnosis not present

## 2017-10-04 DIAGNOSIS — R202 Paresthesia of skin: Secondary | ICD-10-CM
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
## 2017-10-07 DIAGNOSIS — R202 Paresthesia of skin: Secondary | ICD-10-CM | POA: Insufficient documentation

## 2017-10-07 DIAGNOSIS — R410 Disorientation, unspecified: Secondary | ICD-10-CM | POA: Insufficient documentation

## 2017-10-12 ENCOUNTER — Telehealth: Payer: Self-pay | Admitting: *Deleted

## 2017-10-12 NOTE — Telephone Encounter (Signed)
We have gotten a note from [employer] that patient's Cologuard sent 04/19/17 was unable to be processed due to "not being collected according to the provided instructions."   Patient lives in a nursing home facility [facility]), therefore I have spoken to Foster at the facility who states they would try to get the specimen again if sent a new kit.  I have also contacted [facility] to ask for new test kit to be sent out. Per Carlton Adam, [employer] has attempted to contact the nursing facility on several occasions to give instructions on how to properly collect specimen and to resend kit but have been hung up on several times. I explained that this is a nursing home facility and that they may have to take extra effort to get someone. He states that he thinks some of the problem may be that they have been doing an automated call so that may be why they are getting hung up on. He will make certain that they put a live associate on phone next time they call to attempt to re-educate and resend kit.

## 2017-10-14 ENCOUNTER — Emergency Department (HOSPITAL_COMMUNITY)
Admission: EM | Admit: 2017-10-14 | Discharge: 2017-10-14 | Disposition: A | Discharge status: Home / Self Care | Payer: Medicare Other | Attending: Emergency Medicine | Admitting: Emergency Medicine

## 2017-10-14 ENCOUNTER — Other Ambulatory Visit: Payer: Self-pay

## 2017-10-14 DIAGNOSIS — I110 Hypertensive heart disease with heart failure: Secondary | ICD-10-CM | POA: Insufficient documentation

## 2017-10-14 DIAGNOSIS — I509 Heart failure, unspecified: Secondary | ICD-10-CM | POA: Diagnosis not present

## 2017-10-14 DIAGNOSIS — M623 Immobility syndrome (paraplegic): Secondary | ICD-10-CM | POA: Diagnosis not present

## 2017-10-14 DIAGNOSIS — T7840XA Allergy, unspecified, initial encounter: Secondary | ICD-10-CM | POA: Diagnosis not present

## 2017-10-14 DIAGNOSIS — Z79899 Other long term (current) drug therapy: Secondary | ICD-10-CM | POA: Diagnosis not present

## 2017-10-14 DIAGNOSIS — I259 Chronic ischemic heart disease, unspecified: Secondary | ICD-10-CM | POA: Diagnosis not present

## 2017-10-14 DIAGNOSIS — R21 Rash and other nonspecific skin eruption: Secondary | ICD-10-CM | POA: Diagnosis present

## 2017-10-14 MED ORDER — PREDNISONE 10 MG PO TABS: 10.0000 mg | ORAL_TABLET | Freq: Every day | ORAL | 0 refills | Status: DC

## 2017-10-14 MED ORDER — FAMOTIDINE IN NACL 20-0.9 MG/50ML-% IV SOLN
20.0000 mg | Freq: Once | INTRAVENOUS | Status: AC
  Administered 2017-10-14: 20 mg via INTRAVENOUS
  Filled 2017-10-14: qty 50

## 2017-10-14 MED ORDER — SODIUM CHLORIDE 0.9 % IV BOLUS (SEPSIS)
500.0000 mL | Freq: Once | INTRAVENOUS | Status: AC
  Administered 2017-10-14: 500 mL via INTRAVENOUS

## 2017-10-14 NOTE — ED Notes (Signed)
Per previous phleb no need to collect labs timed at 1248

## 2017-10-14 NOTE — ED Provider Notes (Signed)
[facility] EMERGENCY DEPARTMENT Provider Note   CSN: [id number] Arrival date & time: 10/14/17  1217     History   Chief Complaint Chief Complaint  Patient presents with  . Allergic Reaction    HPI [name] is a 67 y.o. male.  Level 5 caveat for urgent need for intervention.  Patient arrived from [facility] with a pruritic rash.  He has been receiving Rocephin and clindamycin for an alleged infected decubitus ulcer.  EMS administered 50 mg of Benadryl IV, 125 mg Solu-Medrol IV.  No respiratory distress or difficulty swallowing.  Severity of symptoms is moderate.  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      Past Medical History:  Diagnosis Date  . Anemia   . Anxiety   . Blood clotting disorder (HCC)   . CAD (coronary artery disease)   . Cellulitis   . CHF (congestive heart failure) (HCC)   . Coronary artery disease   . CTS (carpal tunnel syndrome)   . Dysphagia, oral phase   . GERD (gastroesophageal reflux disease)   . Heart failure (HCC)   . Hematuria 11/17/2015  . Hypertension   . Irritable bowel syndrome   . Muscle weakness (generalized)   . Neurogenic dysfunction of the urinary bladder   . Obesity   . OSA (obstructive sleep apnea)   . Paraplegia (HCC)   . Pneumonia   . Pressure ulcer   . PVD (peripheral vascular disease) (HCC)   . Sacral decubitus ulcer    causing severe anemia  . Seizure (HCC)   . Urinary tract infection     Patient Active Problem List   Diagnosis Date Noted  . Paresthesia 10/07/2017  . Confusion 10/07/2017  . Anemia 10/04/2016  . Hypertension 10/04/2016  . Overweight (BMI 25.0-29.9) 10/04/2016  . Decubitus ulcer of foot 01/12/2016  . Hematuria 11/17/2015  . Pain in shoulder 10/27/2015  . 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 Acute respiratory failure (HCC) 07/12/2012  . H/O CHF 07/12/2012  . Lower paraplegia (HCC) 07/12/2012  . Seizure disorder (HCC) 07/12/2012  .  Decubitus ulcer of coccygeal region 07/12/2012  . Iron deficiency anemia 07/12/2012  . 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 COLON SURGERY     colonostomy  . COLONOSCOPY WITH PROPOFOL N/A 05/25/2013   Procedure: COLONOSCOPY WITH PROPOFOL;  Surgeon: Graylin Shiver, MD;  Location: [facility];  Service: Endoscopy;  Laterality: N/A;  . colonostomy    . ESOPHAGOGASTRODUODENOSCOPY (EGD) WITH PROPOFOL N/A 05/25/2013   Procedure: ESOPHAGOGASTRODUODENOSCOPY (EGD) WITH PROPOFOL;  Surgeon: Graylin Shiver, MD;  Location: [facility];  Service: Endoscopy;  Laterality: N/A;       Home Medications    Prior to Admission medications   Medication Sig Start Date End Date Taking? Authorizing Provider  acetaZOLAMIDE (DIAMOX) 500 MG capsule Take 500 mg by mouth daily.    [provider]  alum & mag hydroxide-simeth (MAALOX/MYLANTA) 200-200-20 MG/5ML suspension Take 30 mLs by mouth every 4 (four) hours as needed for indigestion or heartburn.    [provider]  Amino Acids-Protein Hydrolys (FEEDING SUPPLEMENT, PRO-STAT SUGAR FREE 64,) LIQD Take 30 mLs by mouth 4 (four) times daily.    [provider]  ARIPiprazole (ABILIFY) 15 MG tablet Take 15 mg by mouth daily.    [provider]  carboxymethylcellulose 1 % ophthalmic solution Place 1 drop into both eyes 4 (four)  times daily.     [provider]  divalproex (DEPAKOTE) 500 MG DR tablet Take 500 mg by mouth at bedtime.     [provider]  docusate sodium (COLACE) 100 MG capsule Take 200 mg by mouth 2 (two) times daily.     [provider]  ferrous sulfate 325 (65 FE) MG tablet Take 325 mg by mouth 2 (two) times daily with a meal.    [provider]  furosemide (LASIX) 20 MG tablet Take 60 mg by mouth 2 (two) times daily.    [provider]  latanoprost (XALATAN) 0.005 % ophthalmic solution Place 1 drop into both eyes at bedtime.     [provider]  loratadine (CLARITIN) 10 MG tablet Take 10 mg by mouth daily.     [provider]  Melatonin 5 MG TABS Take 5 mg by mouth at bedtime as needed.    [provider]  Menthol, Topical Analgesic, (BIOFREEZE) 10 % LIQD Apply 1 application topically 2 (two) times daily.    [provider]  metoprolol succinate (TOPROL-XL) 25 MG 24 hr tablet Take 12.5 mg by mouth daily.     [provider]  mirabegron ER (MYRBETRIQ) 25 MG TB24 tablet Take 25 mg by mouth daily.    [provider]  Multiple Vitamin (MULTIVITAMIN WITH MINERALS) TABS tablet Take 1 tablet by mouth daily.    [provider]  OXYGEN Inhale into the lungs. 2 liters as needed    [provider]  pantoprazole (PROTONIX) 40 MG tablet Take 40 mg by mouth 2 (two) times daily.    [provider]  polyethylene glycol (MIRALAX / GLYCOLAX) packet Take 17 g by mouth daily as needed for mild constipation or moderate constipation.     [provider]  potassium chloride (K-DUR) 10 MEQ tablet Take 10 mEq by mouth daily.    [provider]  predniSONE (DELTASONE) 10 MG tablet Take 1 tablet (10 mg total) by mouth daily with breakfast. 3 tablets for 3 days, 2 tablets for 3 days, 1 tablet for 3 days. 10/14/17   Donnetta Hutchingook, Anis Degidio, MD  ranitidine (ZANTAC) 150 MG tablet Take 150 mg by mouth daily.     [provider]  rOPINIRole (REQUIP) 1 MG tablet Take 1.5 mg by mouth at bedtime.    [provider]  saccharomyces boulardii (FLORASTOR) 250 MG capsule Take 250 mg by mouth 2 (two) times daily.    [provider]  sennosides-docusate sodium (SENOKOT-S) 8.6-50 MG tablet Take 1 tablet by mouth 2 (two) times daily.     [provider]  simvastatin (ZOCOR) 10 MG tablet Take 10 mg by mouth at bedtime.    [provider]  sodium chloride (OCEAN) 0.65 % SOLN nasal spray Place 1 spray into both nostrils every 3 (three)  hours as needed for congestion.    [provider]  Trolamine Salicylate (ASPERCREME EX) Apply 1 application topically daily. Pt applies to right shoulder.    [provider]  venlafaxine XR (EFFEXOR XR) 75 MG 24 hr capsule Take 75 mg by mouth daily with breakfast.    [provider]  vitamin C (ASCORBIC ACID) 500 MG tablet Take 500 mg by mouth 2 (two) times daily.     [provider]  Vitamin D, Ergocalciferol, (DRISDOL) 50000 units CAPS capsule Take 50,000 Units by mouth every 30 (thirty) days. Pt takes on the 15th of every month.    [provider]  Family History Family History  Problem Relation Age of Onset  . Heart failure Mother   . Heart failure Father   . Brain cancer Brother        Had a tumor  . Colon cancer Neg Hx   . Colon polyps Neg Hx   . Kidney disease Neg Hx   . Gallbladder disease Neg Hx     Social History Social History   Tobacco Use  . Smoking status: Never Smoker  . Smokeless tobacco: Never Used  Substance Use Topics  . Alcohol use: No    Alcohol/week: 0.0 oz  . Drug use: No     Allergies   Ciprofloxacin; Minocycline hcl; and Tape   Review of Systems Review of Systems  Unable to perform ROS: Acuity of condition     Physical Exam Updated Vital Signs There were no vitals taken for this visit.  Physical Exam  Constitutional:  No respiratory distress  HENT:  Head: Normocephalic and atraumatic.  Eyes: Conjunctivae are normal.  Neck: Neck supple.  Cardiovascular: Normal rate and regular rhythm.  Pulmonary/Chest: Effort normal and breath sounds normal.  Abdominal: Soft. Bowel sounds are normal.  Musculoskeletal: Normal range of motion.  Neurological: He is alert.  Paraplegic  Skin:  Splotchy diffuse macular erythematous rash on chest, arms, abdomen, perineum  Psychiatric: He has a normal mood and affect. His behavior is normal.  Nursing note and vitals reviewed.    ED Treatments / Results    Labs (all labs ordered are listed, but only abnormal results are displayed) Labs Reviewed  CBC WITH DIFFERENTIAL/PLATELET  BASIC METABOLIC PANEL    EKG  EKG Interpretation None       Radiology No results found.  Procedures Procedures (including critical care time)  Medications Ordered in ED Medications  famotidine (PEPCID) IVPB 20 mg premix (0 mg Intravenous Stopped 10/14/17 1435)  sodium chloride 0.9 % bolus 500 mL (0 mLs Intravenous Stopped 10/14/17 1609)     Initial Impression / Assessment and Plan / ED Course  I have reviewed the triage vital signs and the nursing notes.  Pertinent labs & imaging results that were available during my care of the patient were reviewed by me and considered in my medical decision making (see chart for details).     History and physical most consistent with allergic reaction.  He was supplemented with Pepcid 20 mg IV in the emergency department.  His rash and pruritus decreased.  We will discontinue Rocephin and clindamycin for the time being.  He is hemodynamically stable at discharge.  Final Clinical Impressions(s) / ED Diagnoses   Final diagnoses:  Allergic reaction, initial encounter    ED Discharge Orders        Ordered    predniSONE (DELTASONE) 10 MG tablet  Daily with breakfast     10/14/17 1559       Donnetta Hutching, MD 10/14/17 1614

## 2017-10-14 NOTE — ED Triage Notes (Signed)
[facility] with a C/O allergic reaction. Patient receiving ABX,  Rocephin IM and Clindamycin PO.  Patient developed a rash on his face and chest primarily and was given PO benadryl by the facility.  EMS arrived and noted little improvement with the PO benadryl.  EMS gave 50 mg benadryl, 125 mg solumedrol and 500 ml NSS IV with improvement.  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

## 2017-10-14 NOTE — Discharge Instructions (Signed)
Recommend discontinuation of the antibiotics.  Prescription for prednisone.  Can also take Benadryl.  Suggest contacting the primary care doctor for further recommendations

## 2017-10-14 NOTE — ED Notes (Signed)
[facility] without success.

## 2017-10-14 NOTE — ED Notes (Signed)
Patient verbalizes understanding of discharge instructions. Opportunity for questioning and answers were provided. Armband removed by staff, pt [facility] ED [facility]

## 2017-10-17 ENCOUNTER — Other Ambulatory Visit: Payer: Self-pay

## 2017-10-17 ENCOUNTER — Encounter (HOSPITAL_COMMUNITY): Payer: Self-pay

## 2017-10-17 ENCOUNTER — Emergency Department (HOSPITAL_COMMUNITY)
Admission: EM | Admit: 2017-10-17 | Discharge: 2017-10-18 | Disposition: A | Discharge status: Home / Self Care | Payer: Medicare Other | Attending: Emergency Medicine | Admitting: Emergency Medicine

## 2017-10-17 ENCOUNTER — Emergency Department (HOSPITAL_COMMUNITY): Payer: Medicare Other

## 2017-10-17 DIAGNOSIS — L500 Allergic urticaria: Secondary | ICD-10-CM | POA: Insufficient documentation

## 2017-10-17 DIAGNOSIS — I509 Heart failure, unspecified: Secondary | ICD-10-CM | POA: Insufficient documentation

## 2017-10-17 DIAGNOSIS — Z79899 Other long term (current) drug therapy: Secondary | ICD-10-CM | POA: Insufficient documentation

## 2017-10-17 DIAGNOSIS — L509 Urticaria, unspecified: Secondary | ICD-10-CM

## 2017-10-17 DIAGNOSIS — T7840XA Allergy, unspecified, initial encounter: Secondary | ICD-10-CM | POA: Diagnosis present

## 2017-10-17 DIAGNOSIS — I110 Hypertensive heart disease with heart failure: Secondary | ICD-10-CM | POA: Insufficient documentation

## 2017-10-17 DIAGNOSIS — Z889 Allergy status to unspecified drugs, medicaments and biological substances status: Secondary | ICD-10-CM | POA: Diagnosis not present

## 2017-10-17 DIAGNOSIS — I2510 Atherosclerotic heart disease of native coronary artery without angina pectoris: Secondary | ICD-10-CM | POA: Insufficient documentation

## 2017-10-17 LAB — COMPREHENSIVE METABOLIC PANEL
ALBUMIN: 2.4 g/dL — AB (ref 3.5–5.0)
ALK PHOS: 139 U/L — AB (ref 38–126)
ALT: 15 U/L — AB (ref 17–63)
ANION GAP: 7 (ref 5–15)
AST: 17 U/L (ref 15–41)
BUN: 25 mg/dL — ABNORMAL HIGH (ref 6–20)
CHLORIDE: 97 mmol/L — AB (ref 101–111)
CO2: 33 mmol/L — ABNORMAL HIGH (ref 22–32)
Calcium: 9.8 mg/dL (ref 8.9–10.3)
Creatinine, Ser: 0.77 mg/dL (ref 0.61–1.24)
GFR calc non Af Amer: >60 mL/min (ref >60)
GLUCOSE: 121 mg/dL — AB (ref 65–99)
Potassium: 4.7 mmol/L (ref 3.5–5.1)
SODIUM: 137 mmol/L (ref 135–145)
Total Bilirubin: 0.4 mg/dL (ref 0.3–1.2)
Total Protein: 8.4 g/dL — ABNORMAL HIGH (ref 6.5–8.1)

## 2017-10-17 LAB — CBC WITH DIFFERENTIAL/PLATELET
BASOS PCT: 0 %
Basophils Absolute: 0 10*3/uL (ref 0.0–0.1)
EOS PCT: 0 %
Eosinophils Absolute: 0.1 10*3/uL (ref 0.0–0.7)
HCT: 35.8 % — ABNORMAL LOW (ref 39.0–52.0)
HEMOGLOBIN: 9.6 g/dL — AB (ref 13.0–17.0)
LYMPHS ABS: 0.5 10*3/uL — AB (ref 0.7–4.0)
Lymphocytes Relative: 3 %
MCH: 21.4 pg — AB (ref 26.0–34.0)
MCHC: 26.8 g/dL — AB (ref 30.0–36.0)
MCV: 79.7 fL (ref 78.0–100.0)
Monocytes Absolute: 0.6 10*3/uL (ref 0.1–1.0)
Monocytes Relative: 4 %
NEUTROS PCT: 93 %
Neutro Abs: 15.5 10*3/uL — ABNORMAL HIGH (ref 1.7–7.7)
PLATELETS: 650 10*3/uL — AB (ref 150–400)
RBC: 4.49 MIL/uL (ref 4.22–5.81)
RDW: 18.9 % — ABNORMAL HIGH (ref 11.5–15.5)
WBC: 16.7 10*3/uL — AB (ref 4.0–10.5)

## 2017-10-17 LAB — VALPROIC ACID LEVEL: VALPROIC ACID LVL: 17 ug/mL — AB (ref 50.0–100.0)

## 2017-10-17 LAB — SEDIMENTATION RATE: SED RATE: 70 mm/h — AB (ref 0–16)

## 2017-10-17 LAB — I-STAT CG4 LACTIC ACID, ED: Lactic Acid, Venous: 1.98 mmol/L — ABNORMAL HIGH (ref 0.5–1.9)

## 2017-10-17 MED ORDER — METHYLPREDNISOLONE SODIUM SUCC 125 MG IJ SOLR
125.0000 mg | Freq: Once | INTRAMUSCULAR | Status: AC
  Administered 2017-10-17: 125 mg via INTRAVENOUS
  Filled 2017-10-17: qty 2

## 2017-10-17 MED ORDER — VANCOMYCIN HCL 10 G IV SOLR
2000.0000 mg | Freq: Once | INTRAVENOUS | Status: AC
  Administered 2017-10-17: 2000 mg via INTRAVENOUS
  Filled 2017-10-17: qty 2000

## 2017-10-17 MED ORDER — SODIUM CHLORIDE 0.9 % IV SOLN
Freq: Once | INTRAVENOUS | Status: AC
  Administered 2017-10-17: 16:00:00 via INTRAVENOUS

## 2017-10-17 MED ORDER — LINEZOLID 600 MG PO TABS
600.0000 mg | ORAL_TABLET | Freq: Once | ORAL | Status: AC
  Administered 2017-10-18: 600 mg via ORAL
  Filled 2017-10-17: qty 1

## 2017-10-17 MED ORDER — DIPHENHYDRAMINE HCL 25 MG PO TABS: 25.0000 mg | ORAL_TABLET | Freq: Four times a day (QID) | ORAL | 0 refills | Status: DC

## 2017-10-17 MED ORDER — FAMOTIDINE 40 MG PO TABS: 40.0000 mg | ORAL_TABLET | Freq: Every day | ORAL | 0 refills | Status: DC

## 2017-10-17 MED ORDER — FAMOTIDINE IN NACL 20-0.9 MG/50ML-% IV SOLN
20.0000 mg | Freq: Once | INTRAVENOUS | Status: AC
  Administered 2017-10-17: 20 mg via INTRAVENOUS
  Filled 2017-10-17: qty 50

## 2017-10-17 MED ORDER — HYDROXYZINE HCL 25 MG PO TABS
50.0000 mg | ORAL_TABLET | Freq: Once | ORAL | Status: AC
  Administered 2017-10-17: 50 mg via ORAL
  Filled 2017-10-17: qty 2

## 2017-10-17 MED ORDER — LINEZOLID 600 MG PO TABS: 600.0000 mg | ORAL_TABLET | Freq: Two times a day (BID) | ORAL | 0 refills | Status: AC

## 2017-10-17 NOTE — ED Triage Notes (Signed)
[facility] for allergic reaction and rash with itching since Friday from possible ABX for UTI. 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

## 2017-10-17 NOTE — ED Notes (Signed)
Patient had Malawiturkey sandwich and diet coke

## 2017-10-17 NOTE — ED Notes (Signed)
ABX stopped at this time d/t inadequate IV patency. IV abx discontinued at this time per MD

## 2017-10-17 NOTE — ED Notes (Signed)
IV abx stopped at this time d/t inadequate IV patency. IV team en route to replace.

## 2017-10-17 NOTE — Discharge Instructions (Signed)
Your reaction today is likely from continuing to take clindamycin.  You have no lesions in your mouth, eyes, penis, or other symptoms to suggest Stevens-Johnson's or more severe reaction.  STOP TAKING CLINDAMYCIN AND ROCEPHIN  For your rash, it is imperative that you stop taking clindamycin.  Continue taking prednisone as prescribed.  I also recommend that you take Benadryl 25 mg every 6 hours for the next 3-5 days, as well as Pepcid daily.  I discussed your culture results with infectious disease physician here.  Stop clindamycin and any other antibiotics.  Our infectious disease doctor recommends Zyvox for which I have written a prescription.  He should follow-up with your doctor in 24-48 hours for repeat wound check.  I also recommend that you follow-up with your wound care clinic or physician in the next 24-48 hours.

## 2017-10-17 NOTE — ED Notes (Signed)
Bed: WA09 Expected date:  Expected time:  Means of arrival:  Comments: ems 

## 2017-10-17 NOTE — ED Provider Notes (Signed)
[facility]-EMERGENCY DEPT Provider Note   CSN: [id number] Arrival date & time: 10/17/17  1517     History   Chief Complaint Chief Complaint  Patient presents with  . Allergic Reaction    HPI [name] is a 67 y.o. male.  HPI 67 year old male with extensive past medical history as below including paraplegia here with ongoing allergic reaction.  The patient was just seen on 3/8.  He was diagnosed with possible allergic reaction to Rocephin and clindamycin.  He was given steroids and told to stop these medications.  However, according to the facility, the patient is continued to take these medications.  He has subsequently had persistently worsening diffuse rash.  He has had some occasional shortness of breath and difficulty swallowing.  He denies any abdominal pain, nausea, or vomiting.  No diarrhea.  No fevers or chills.  The rash began on his arms and is progressively more spread.  He has been taking prednisone as prescribed.  No history of previous reaction similar to this.  Denies any hematuria.  Denies any mouth sores.  No eye pain or redness.  No history of similar reactions in the past.  Past Medical History:  Diagnosis Date  . Anemia   . Anxiety   . Blood clotting disorder (HCC)   . CAD (coronary artery disease)   . Cellulitis   . CHF (congestive heart failure) (HCC)   . Coronary artery disease   . CTS (carpal tunnel syndrome)   . Dysphagia, oral phase   . GERD (gastroesophageal reflux disease)   . Heart failure (HCC)   . Hematuria 11/17/2015  . Hypertension   . Irritable bowel syndrome   . Muscle weakness (generalized)   . Neurogenic dysfunction of the urinary bladder   . Obesity   . OSA (obstructive sleep apnea)   . Paraplegia (HCC)   . Pneumonia   . Pressure ulcer   . PVD (peripheral vascular disease) (HCC)   . Sacral decubitus ulcer    causing severe anemia  . Seizure (HCC)   . Urinary tract infection     Patient Active Problem  List   Diagnosis Date Noted  . Paresthesia 10/07/2017  . Confusion 10/07/2017  . Anemia 10/04/2016  . Hypertension 10/04/2016  . Overweight (BMI 25.0-29.9) 10/04/2016  . Decubitus ulcer of foot 01/12/2016  . Hematuria 11/17/2015  . Pain in shoulder 10/27/2015  . Colostomy in place [facility](HCC) 07/13/2012  . Acute respiratory failure (HCC) 07/12/2012  . H/O CHF 07/12/2012  . Lower paraplegia (HCC) 07/12/2012  . Seizure disorder (HCC) 07/12/2012  . Decubitus ulcer of coccygeal region 07/12/2012  . Iron deficiency anemia 07/12/2012  . 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 COLON SURGERY     colonostomy  . COLONOSCOPY WITH PROPOFOL N/A 05/25/2013   Procedure: COLONOSCOPY WITH PROPOFOL;  Surgeon: Graylin ShiverSalem F Ganem, MD;  Location: [facility] ENDOSCOPY;  Service: Endoscopy;  Laterality: N/A;  . colonostomy    . ESOPHAGOGASTRODUODENOSCOPY (EGD) WITH PROPOFOL N/A 05/25/2013   Procedure: ESOPHAGOGASTRODUODENOSCOPY (EGD) WITH PROPOFOL;  Surgeon: Graylin ShiverSalem F Ganem, MD;  Location: [facility] ENDOSCOPY;  Service: Endoscopy;  Laterality: N/A;       Home Medications    Prior to Admission medications   Medication Sig Start Date End Date Taking? Authorizing Provider  acetaZOLAMIDE (DIAMOX) 250 MG tablet Take 500 mg by mouth daily.   Yes [provider]  Amino Acids-Protein Hydrolys (FEEDING SUPPLEMENT, PRO-STAT SUGAR FREE 64,)  LIQD Take 30 mLs by mouth 4 (four) times daily.   Yes [provider]  ARIPiprazole (ABILIFY) 15 MG tablet Take 7.5 mg by mouth at bedtime.    Yes [provider]  carboxymethylcellulose 1 % ophthalmic solution Place 1 drop into both eyes 4 (four) times daily.    Yes [provider]  divalproex (DEPAKOTE) 250 MG DR tablet Take 250 mg by mouth daily.   Yes [provider]  divalproex (DEPAKOTE) 500 MG DR tablet Take 500 mg by mouth at bedtime.    Yes [provider]  docusate sodium (COLACE) 100 MG  capsule Take 200 mg by mouth 2 (two) times daily.    Yes [provider]  furosemide (LASIX) 20 MG tablet Take 60 mg by mouth 2 (two) times daily.   Yes [provider]  latanoprost (XALATAN) 0.005 % ophthalmic solution Place 1 drop into both eyes at bedtime.   Yes [provider]  Lidocaine (ASPERCREME LIDOCAINE) 4 % PTCH Apply 1 patch topically See admin instructions. Apply to right shoulder every evening   Yes [provider]  loratadine (CLARITIN) 10 MG tablet Take 10 mg by mouth daily.    Yes [provider]  Melatonin 5 MG TABS Take 5 mg by mouth at bedtime.    Yes [provider]  Menthol, Topical Analgesic, (BIOFREEZE COLORLESS EX) Apply 1 application topically See admin instructions. (5%) apply topically to both shoulders twice daily   Yes [provider]  metoprolol succinate (TOPROL-XL) 25 MG 24 hr tablet Take 12.5 mg by mouth daily.    Yes [provider]  mirabegron ER (MYRBETRIQ) 25 MG TB24 tablet Take 25 mg by mouth daily.   Yes [provider]  Multiple Vitamins-Minerals (CERTAVITE/ANTIOXIDANTS PO) Take 1 tablet by mouth daily.   Yes [provider]  nystatin (MYCOSTATIN) 100000 UNITS/ML SUSP Take 5 mLs by mouth every 6 (six) hours. For 7 Days.   Yes [provider]  oxybutynin (DITROPAN-XL) 10 MG 24 hr tablet Take 20 mg by mouth daily.   Yes [provider]  oxyCODONE-acetaminophen (PERCOCET/ROXICET) 5-325 MG tablet Take 1 tablet by mouth 3 (three) times daily as needed (acute and chronic pain).   Yes [provider]  potassium chloride (K-DUR) 10 MEQ tablet Take 10 mEq by mouth daily.   Yes [provider]  predniSONE (DELTASONE) 10 MG tablet Take 1 tablet (10 mg total) by mouth daily with breakfast. 3 tablets for 3 days, 2 tablets for 3 days, 1 tablet for 3 days. 10/14/17  Yes Donnetta Hutching, MD  ranitidine (ZANTAC) 150 MG tablet Take 150 mg by mouth daily.    Yes  [provider]  saccharomyces boulardii (FLORASTOR) 250 MG capsule Take 250 mg by mouth 2 (two) times daily.   Yes [provider]  sennosides-docusate sodium (SENOKOT-S) 8.6-50 MG tablet Take 1 tablet by mouth 2 (two) times daily.    Yes [provider]  simvastatin (ZOCOR) 10 MG tablet Take 10 mg by mouth at bedtime.   Yes [provider]  venlafaxine XR (EFFEXOR XR) 75 MG 24 hr capsule Take 75 mg by mouth daily with breakfast.   Yes [provider]  vitamin B-12 (CYANOCOBALAMIN) 1000 MCG tablet Take 1,000 mcg by mouth daily.   Yes [provider]  vitamin C (ASCORBIC ACID) 500 MG tablet Take 500 mg by mouth 2 (two) times daily.    Yes [provider]  Vitamin D, Ergocalciferol, (DRISDOL)  50000 units CAPS capsule Take 50,000 Units by mouth every 30 (thirty) days. on the 15th of every month.   Yes [provider]  alum & mag hydroxide-simeth (MAALOX/MYLANTA) 200-200-20 MG/5ML suspension Take 30 mLs by mouth every 4 (four) hours as needed for indigestion or heartburn.    [provider]  diphenhydrAMINE (BENADRYL) 25 MG tablet Take 1 tablet (25 mg total) by mouth every 6 (six) hours for 5 days. 10/17/17 10/22/17  Shaune Pollack, MD  famotidine (PEPCID) 40 MG tablet Take 1 tablet (40 mg total) by mouth daily for 5 days. 10/17/17 10/22/17  Shaune Pollack, MD  linezolid (ZYVOX) 600 MG tablet Take 1 tablet (600 mg total) by mouth 2 (two) times daily for 10 days. 10/17/17 10/27/17  Shaune Pollack, MD  loperamide (IMODIUM A-D) 2 MG tablet Take 2 mg by mouth 4 (four) times daily as needed (after each loose stool).    [provider]  OXYGEN Inhale 2 L into the lungs as needed (SATS <90%).     [provider]  polyethylene glycol (MIRALAX / GLYCOLAX) packet Take 17 g by mouth daily as needed for mild constipation or moderate constipation.     [provider]  rOPINIRole (REQUIP) 1 MG tablet Take 1.5 mg by  mouth at bedtime.    [provider]  sodium chloride (OCEAN) 0.65 % SOLN nasal spray Place 1 spray into both nostrils 3 (three) times daily as needed (nasal dryness).     [provider]    Family History Family History  Problem Relation Age of Onset  . Heart failure Mother   . Heart failure Father   . Brain cancer Brother        Had a tumor  . Colon cancer Neg Hx   . Colon polyps Neg Hx   . Kidney disease Neg Hx   . Gallbladder disease Neg Hx     Social History Social History   Tobacco Use  . Smoking status: Never Smoker  . Smokeless tobacco: Never Used  Substance Use Topics  . Alcohol use: No    Alcohol/week: 0.0 oz  . Drug use: No     Allergies   Ciprofloxacin; Clindamycin/lincomycin; Minocycline hcl; and Tape   Review of Systems Review of Systems  Constitutional: Positive for fatigue.  Respiratory: Positive for shortness of breath.   Skin: Positive for rash.  All other systems reviewed and are negative.    Physical Exam Updated Vital Signs BP 134/65   Pulse 70   Temp 98 F (36.7 C) (Oral)   Resp 16   SpO2 100%   Physical Exam  Constitutional: He is oriented to person, place, and time. He appears well-developed and well-nourished. No distress.  HENT:  Head: Normocephalic and atraumatic.  No oral pharyngeal swelling or edema.  Tongue without edema.  Normal tongue protrusion.  Oropharynx soft.  No uvular edema.  No mouth sores or ulcerations.  Eyes: Conjunctivae are normal.  Neck: Neck supple.  Cardiovascular: Normal rate, regular rhythm and normal heart sounds. Exam reveals no friction rub.  No murmur heard. Pulmonary/Chest: Effort normal and breath sounds normal. No respiratory distress. He has no wheezes. He has no rales.  Abdominal: He exhibits no distension.  Musculoskeletal: He exhibits no edema.  Neurological: He is alert and oriented to person, place, and time. He exhibits normal muscle tone.  Skin: Skin is warm. Capillary  refill takes less than 2 seconds.  Diffuse, severe urticarial rash worse on the bilateral upper  extremities and trunk but extending to bilateral legs.  This involves the back as well.  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  No expressible purulence.  Psychiatric: He has a normal mood and affect.  Nursing note and vitals reviewed.        ED Treatments / Results  Labs (all labs ordered are listed, but only abnormal results are displayed) Labs Reviewed  CBC WITH DIFFERENTIAL/PLATELET - Abnormal; Notable for the following components:      Result Value   WBC 16.7 (*)    Hemoglobin 9.6 (*)    HCT 35.8 (*)    MCH 21.4 (*)    MCHC 26.8 (*)    RDW 18.9 (*)    Platelets 650 (*)    Neutro Abs 15.5 (*)    Lymphs Abs 0.5 (*)    All other components within normal limits  COMPREHENSIVE METABOLIC PANEL - Abnormal; Notable for the following components:   Chloride 97 (*)    CO2 33 (*)    Glucose, Bld 121 (*)    BUN 25 (*)    Total Protein 8.4 (*)    Albumin 2.4 (*)    ALT 15 (*)    Alkaline Phosphatase 139 (*)    All other components within normal limits  VALPROIC ACID LEVEL - Abnormal; Notable for the following components:   Valproic Acid Lvl 17 (*)    All other components within normal limits  SEDIMENTATION RATE - Abnormal; Notable for the following components:   Sed Rate 70 (*)    All other components within normal limits  I-STAT CG4 LACTIC ACID, ED - Abnormal; Notable for the following components:   Lactic Acid, Venous 1.98 (*)    All other components within normal limits  C-REACTIVE PROTEIN  I-STAT CG4 LACTIC ACID, ED    EKG  EKG Interpretation None       Radiology Dg Chest Portable 1 View  Result Date: 10/17/2017 CLINICAL DATA:  EMS reports from [facility] called for allergic reaction and rash with itching since Friday from possible ABX for UTI. Dx with MRSA from wound sacral area. Hx CHF, CAD, HTN, non-smoker. EXAM: PORTABLE CHEST 1 VIEW  COMPARISON:  Radiograph 07/19/2012 FINDINGS: Normal cardiac silhouette. Low lung volumes. Mild central venous pulmonary congestion. Vascular stent in the RIGHT subclavian vein. IMPRESSION: Mild central venous congestion.  No overt pulmonary edema. Electronically Signed   By: Genevive Bi M.D.   On: 10/17/2017 16:18    Procedures Procedures (including critical care time)  Medications Ordered in ED Medications  vancomycin (VANCOCIN) 2,000 mg in sodium chloride 0.9 % 500 mL IVPB (2,000 mg Intravenous New Bag/Given 10/17/17 1906)  methylPREDNISolone sodium succinate (SOLU-MEDROL) 125 mg/2 mL injection 125 mg (125 mg Intravenous Given 10/17/17 1627)  famotidine (PEPCID) IVPB 20 mg premix (0 mg Intravenous Stopped 10/17/17 1730)  0.9 %  sodium chloride infusion ( Intravenous Stopped 10/17/17 1807)  hydrOXYzine (ATARAX/VISTARIL) tablet 50 mg (50 mg Oral Given 10/17/17 1906)     Initial Impression / Assessment and Plan / ED Course  I have reviewed the triage vital signs and the nursing notes.  Pertinent labs & imaging results that were available during my care of the patient were reviewed by me and considered in my medical decision making (see chart for details).     67 year old male with past medical history as above here with ongoing urticarial reaction.  Patient was just seen on 3/8 after being started on Rocephin and clindamycin for  possible decubitus ulcer infection.  Patient has well-documented history of the same.  He is here with ongoing rash.  I reviewed the patient's records and discussed with the nursing home.  Patient reportedly was started on prednisone, as recommended, but has continued to take the antibiotics.  Given that the rash onset correlates directly with starting clindamycin, I suspect he is allergic to this.  He has had similar reactions to multiple antibiotics in the past.  Facility was concerned about possible systemic rash, but he has absolutely no evidence of conjunctival  injection, oral lesions or involvement, no dysuria, no signs of mucous membrane involvement.  The rash is highly consistent with urticarial rash.  Patient was given steroids, Pepcid, and received Benadryl in route with dramatic improvement in his rash.  This is consistent with ongoing allergic reaction due to ongoing exposure, more so than Stevens-Johnson's.  His lab work is otherwise reassuring and at his baseline.  He does have a leukocytosis, but has been on high-dose steroids at the facility and I suspect this is related to his steroid treatments.  Regarding his decubitus ulcer, this is well documented as a chronic issue.  The wound does not appear grossly infected to me at this time.  I reviewed culture data from the facility, the patient is receiving regular wound care.  He is growing MRSA.  Discussed the case with Dr. Ninetta Lights of ID, who recommends switching to Zyvox.  He believes this is appropriate and can be continued as an outpatient.  Patient will be given a dose of IV vancomycin here, placed on Zyvox, and should receive ongoing care at his facility.  He must stop Rocephin as well as clindamycin as a suspect these are the triggers.  This was documented and relayed to the facility.  Patient monitored here for several hours with significant improvement in his rash.  No oral or other mucosal lesions.  Stable for discharge.  He received a dose of vancomycin here.  I provided direct instructions to his facility.  Final Clinical Impressions(s) / ED Diagnoses   Final diagnoses:  Drug allergy  Urticaria    ED Discharge Orders        Ordered    linezolid (ZYVOX) 600 MG tablet  2 times daily     10/17/17 1916    diphenhydrAMINE (BENADRYL) 25 MG tablet  Every 6 hours     10/17/17 1917    famotidine (PEPCID) 40 MG tablet  Daily     10/17/17 1917       Shaune Pollack, MD 10/17/17 1920

## 2017-10-18 DIAGNOSIS — L500 Allergic urticaria: Secondary | ICD-10-CM | POA: Diagnosis not present

## 2017-10-18 NOTE — ED Notes (Signed)
Transport called at this time.  

## 2017-10-18 NOTE — ED Notes (Signed)
PTAR here at this time.

## 2017-11-09 ENCOUNTER — Ambulatory Visit (INDEPENDENT_AMBULATORY_CARE_PROVIDER_SITE_OTHER): Payer: Medicare Other | Admitting: Internal Medicine

## 2017-11-09 ENCOUNTER — Other Ambulatory Visit (INDEPENDENT_AMBULATORY_CARE_PROVIDER_SITE_OTHER): Payer: Medicare Other

## 2017-11-09 ENCOUNTER — Encounter: Payer: Self-pay | Admitting: Internal Medicine

## 2017-11-09 VITALS — BP 138/68 | HR 76 | Resp 18

## 2017-11-09 DIAGNOSIS — L89159 Pressure ulcer of sacral region, unspecified stage: Secondary | ICD-10-CM | POA: Diagnosis not present

## 2017-11-09 DIAGNOSIS — K5900 Constipation, unspecified: Secondary | ICD-10-CM

## 2017-11-09 DIAGNOSIS — G8220 Paraplegia, unspecified: Secondary | ICD-10-CM

## 2017-11-09 DIAGNOSIS — D509 Iron deficiency anemia, unspecified: Secondary | ICD-10-CM

## 2017-11-09 LAB — CBC WITH DIFFERENTIAL/PLATELET
BASOS ABS: 0 10*3/uL (ref 0.0–0.1)
Basophils Relative: 0.3 % (ref 0.0–3.0)
EOS PCT: 4 % (ref 0.0–5.0)
Eosinophils Absolute: 0.3 10*3/uL (ref 0.0–0.7)
HEMATOCRIT: 27.2 % — AB (ref 39.0–52.0)
Hemoglobin: 8.3 g/dL — ABNORMAL LOW (ref 13.0–17.0)
LYMPHS PCT: 15 % (ref 12.0–46.0)
Lymphs Abs: 1.3 10*3/uL (ref 0.7–4.0)
MCHC: 30.4 g/dL (ref 30.0–36.0)
MCV: 76.3 fl — AB (ref 78.0–100.0)
MONOS PCT: 10.9 % (ref 3.0–12.0)
Monocytes Absolute: 0.9 10*3/uL (ref 0.1–1.0)
NEUTROS ABS: 5.9 10*3/uL (ref 1.4–7.7)
Neutrophils Relative %: 69.8 % (ref 43.0–77.0)
PLATELETS: 609 10*3/uL — AB (ref 150.0–400.0)
RBC: 3.57 Mil/uL — AB (ref 4.22–5.81)
RDW: 23.3 % — ABNORMAL HIGH (ref 11.5–15.5)
WBC: 8.4 10*3/uL (ref 4.0–10.5)

## 2017-11-09 LAB — IBC PANEL
Iron: 17 ug/dL — ABNORMAL LOW (ref 42–165)
SATURATION RATIOS: 7.4 % — AB (ref 20.0–50.0)
Transferrin: 163 mg/dL — ABNORMAL LOW (ref 212.0–360.0)

## 2017-11-09 LAB — FERRITIN: Ferritin: 312.5 ng/mL (ref 22.0–322.0)

## 2017-11-09 NOTE — Progress Notes (Signed)
Subjective:    Patient ID: [name], male    DOB: [birth year], 67 y.o.   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  He was last seen on 04/13/2017 and is here alone today driven by ambulance from his [facility].  He reports recently he has been having more issues with hard stool which seems to be more difficult to pass per ostomy.  No blood in his stool or melena per ostomy.  Some increased abdominal bloating better after bowel movement.  He denies abdominal pain.  He reports a history of heartburn but that this is controlled.  His medication list reveals ranitidine 150 mg once daily.  Denies dysphagia and odynophagia.  He did receive IV iron on 05/09/2017 after seeing me in the office.  He reports feeling increased energy and overall improved feeling with IV iron.  He feels that he needs more iron at this time.  He continues to enjoy paining and sitting outside in the sun.  No tobacco or alcohol  Review of Systems As per HPI, otherwise negative  Current Medications, Allergies, Past Medical History, Past Surgical History, Family History and Social History were reviewed in [facility] electronic medical record.     Objective:   Physical Exam BP 138/68   Pulse 76   Resp 18   SpO2 98%  Constitutional: Well-developed and well-nourished. No distress. HEENT: Normocephalic and atraumatic.  No scleral icterus. Neck: Neck supple. Trachea midline. Cardiovascular: Normal rate, regular rhythm and intact distal pulses.  Pulmonary/chest: Effort normal and breath sounds normal. No wheezing, rales or rhonchi. Abdominal: Soft, nontender, nondistended. Bowel sounds active throughout.  Colostomy left  middle quadrant with formed, hard stool in the ostomy bag, stool is brown and heme-negative today, ostomy bag distended with gas which I vented Extremities: Bilateral lower extremities are wrapped with scattered excoriations on bilateral shins Neurological: Alert and oriented to person place and time.  Lying in a stretcher Psychiatric: Normal mood and affect. Behavior is normal.      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   1. IDA --patient with history of iron deficiency which is long-standing.  He has known chronic blood loss from his sacral decubitus ulceration which in the past has been felt to explain his iron deficiency and anemia.  He received IV iron in October with clinical improvement.  I recommended rechecking blood counts and iron studies today.  In the past he had upper endoscopy and colonoscopy to evaluate iron deficiency which was unrevealing. Anemia is likely multifactorial but if persistent may need hematology evaluation Stool today is heme negative, as it has been in the past.  Repeat GI endoscopies would likely be low yield.  I discussed EGD and colonoscopy with him today and he wishes to avoid invasive procedures if possible  2.  Mild constipation --history of constipation and I would like him to start MiraLAX 17 g on a daily basis rather than as needed  25 minutes spent with the patient today. Greater than 50% was spent in counseling and coordination of care with the  patient

## 2017-11-09 NOTE — Patient Instructions (Addendum)
If you are age 68 or older, your body mass index should be between 23-30. Your There is no height or weight on file to calculate BMI. If this is out of the aforementioned range listed, please consider follow up with your Primary Care Provider.  If you are age 68 or younger, your body mass index should be between 19-25. Your There is no height or weight on file to calculate BMI. If this is out of the aformentioned range listed, please consider follow up with your Primary Care Provider.   Please purchase the following medications over the counter and take as directed: Miralax, mix one capful once daily with 8oz of water and drink.  Follow up in 6 months or sooner if needed.  Lab will come up to draw CBC, ferritin, IBC panel  Thank you for choosing [facility], Dr. Rhea BeltonPyrtle

## 2017-11-11 ENCOUNTER — Other Ambulatory Visit: Payer: Self-pay

## 2017-11-11 ENCOUNTER — Telehealth: Payer: Self-pay | Admitting: Internal Medicine

## 2017-11-11 DIAGNOSIS — D649 Anemia, unspecified: Secondary | ICD-10-CM

## 2017-11-11 NOTE — Telephone Encounter (Signed)
See result note.  

## 2017-11-14 ENCOUNTER — Telehealth: Payer: Self-pay | Admitting: Hematology and Oncology

## 2017-11-14 NOTE — Telephone Encounter (Signed)
Appt has been scheduled for the pt to see Dr. Gweneth DimitriPerlov on 4/19. [facility] Appt date and time given to [place]. Aware that the pt should arrive 30 minutes early. I gave her my direct number in case the pt's appt needs to be rescheduled.

## 2017-11-25 ENCOUNTER — Inpatient Hospital Stay: Payer: Medicare Other | Admitting: Hematology and Oncology

## 2017-11-25 ENCOUNTER — Telehealth: Payer: Self-pay | Admitting: Hematology and Oncology

## 2017-11-25 NOTE — Telephone Encounter (Signed)
Called facility to reschedule missed appointment.  Patient was sick

## 2017-12-01 ENCOUNTER — Encounter: Payer: Self-pay | Admitting: Neurology

## 2017-12-09 ENCOUNTER — Other Ambulatory Visit (HOSPITAL_COMMUNITY): Payer: Self-pay | Admitting: Internal Medicine

## 2017-12-09 DIAGNOSIS — M869 Osteomyelitis, unspecified: Secondary | ICD-10-CM

## 2017-12-12 ENCOUNTER — Encounter: Payer: Medicare Other | Admitting: Hematology and Oncology

## 2017-12-14 ENCOUNTER — Ambulatory Visit (HOSPITAL_COMMUNITY): Admission: RE | Admit: 2017-12-14 | Payer: Medicare Other | Source: Ambulatory Visit

## 2018-01-07 DEATH — deceased

## 2018-02-22 ENCOUNTER — Ambulatory Visit: Payer: Medicare Other | Admitting: Neurology

## 2018-08-04 NOTE — Telephone Encounter (Signed)
This encounter was created in error - please disregard.
